# Patient Record
Sex: Male | Born: 1940 | Race: White | Hispanic: No | Smoking: Former smoker
Health system: Southern US, Community
[De-identification: ages and names within clinical notes are randomized; demographics above are authoritative.]

## PROBLEM LIST (undated history)

## (undated) DIAGNOSIS — F0390 Unspecified dementia without behavioral disturbance: Secondary | ICD-10-CM

## (undated) DIAGNOSIS — I340 Nonrheumatic mitral (valve) insufficiency: Secondary | ICD-10-CM

## (undated) DIAGNOSIS — I10 Essential (primary) hypertension: Secondary | ICD-10-CM

## (undated) DIAGNOSIS — I219 Acute myocardial infarction, unspecified: Secondary | ICD-10-CM

## (undated) DIAGNOSIS — E785 Hyperlipidemia, unspecified: Secondary | ICD-10-CM

## (undated) DIAGNOSIS — I82402 Acute embolism and thrombosis of unspecified deep veins of left lower extremity: Secondary | ICD-10-CM

## (undated) DIAGNOSIS — I2510 Atherosclerotic heart disease of native coronary artery without angina pectoris: Secondary | ICD-10-CM

## (undated) DIAGNOSIS — K219 Gastro-esophageal reflux disease without esophagitis: Secondary | ICD-10-CM

## (undated) DIAGNOSIS — I5042 Chronic combined systolic (congestive) and diastolic (congestive) heart failure: Secondary | ICD-10-CM

## (undated) HISTORY — DX: Hyperlipidemia, unspecified: E78.5

## (undated) HISTORY — PX: CERVICAL SPINE SURGERY: SHX589

## (undated) HISTORY — PX: ELBOW SURGERY: SHX618

## (undated) HISTORY — PX: BACK SURGERY: SHX140

## (undated) HISTORY — PX: LAMINECTOMY: SHX219

---

## 2005-07-05 DIAGNOSIS — I219 Acute myocardial infarction, unspecified: Secondary | ICD-10-CM

## 2005-07-05 HISTORY — PX: CORONARY ARTERY BYPASS GRAFT: SHX141

## 2005-07-05 HISTORY — DX: Acute myocardial infarction, unspecified: I21.9

## 2006-05-15 ENCOUNTER — Inpatient Hospital Stay (HOSPITAL_COMMUNITY): Admission: EM | Admit: 2006-05-15 | Discharge: 2006-05-20 | Payer: Self-pay | Admitting: Emergency Medicine

## 2006-06-03 ENCOUNTER — Encounter: Admission: RE | Admit: 2006-06-03 | Discharge: 2006-06-03 | Payer: Self-pay | Admitting: Cardiothoracic Surgery

## 2010-02-17 ENCOUNTER — Encounter: Admission: RE | Admit: 2010-02-17 | Discharge: 2010-02-17 | Payer: Self-pay | Admitting: Neurology

## 2010-03-18 ENCOUNTER — Inpatient Hospital Stay (HOSPITAL_COMMUNITY): Admission: RE | Admit: 2010-03-18 | Discharge: 2010-03-23 | Payer: Self-pay | Admitting: Neurosurgery

## 2010-09-17 LAB — GLUCOSE, CAPILLARY
Glucose-Capillary: 170 mg/dL — ABNORMAL HIGH (ref 70–99)
Glucose-Capillary: 175 mg/dL — ABNORMAL HIGH (ref 70–99)
Glucose-Capillary: 176 mg/dL — ABNORMAL HIGH (ref 70–99)
Glucose-Capillary: 187 mg/dL — ABNORMAL HIGH (ref 70–99)
Glucose-Capillary: 200 mg/dL — ABNORMAL HIGH (ref 70–99)
Glucose-Capillary: 201 mg/dL — ABNORMAL HIGH (ref 70–99)
Glucose-Capillary: 205 mg/dL — ABNORMAL HIGH (ref 70–99)
Glucose-Capillary: 219 mg/dL — ABNORMAL HIGH (ref 70–99)
Glucose-Capillary: 230 mg/dL — ABNORMAL HIGH (ref 70–99)
Glucose-Capillary: 168 mg/dL — ABNORMAL HIGH (ref 70–99)
Glucose-Capillary: 222 mg/dL — ABNORMAL HIGH (ref 70–99)

## 2010-09-17 LAB — BASIC METABOLIC PANEL
BUN: 13 mg/dL (ref 6–23)
CO2: 31 mEq/L (ref 19–32)
Calcium: 9.3 mg/dL (ref 8.4–10.5)
Creatinine, Ser: 0.78 mg/dL (ref 0.4–1.5)
GFR calc non Af Amer: >60 mL/min (ref >60)
Glucose, Bld: 140 mg/dL — ABNORMAL HIGH (ref 70–99)

## 2010-09-17 LAB — SURGICAL PCR SCREEN: MRSA, PCR: NEGATIVE

## 2010-09-17 LAB — CBC
MCH: 34.7 pg — ABNORMAL HIGH (ref 26.0–34.0)
MCHC: 36.5 g/dL — ABNORMAL HIGH (ref 30.0–36.0)
Platelets: 177 10*3/uL (ref 150–400)
RDW: 13.2 % (ref 11.5–15.5)

## 2010-09-17 LAB — PROTIME-INR
INR: 1.07 (ref 0.00–1.49)
Prothrombin Time: 14.1 seconds (ref 11.6–15.2)

## 2010-11-20 NOTE — Op Note (Signed)
NAMESHARRON, SIMPSON NO.:  [id number]   MEDICAL RECORD NO.:  [id number]          PATIENT TYPE:  INP   LOCATION:  2314                         FACILITY:  [facility]   PHYSICIAN:  Kerin Perna, M.D.  DATE OF BIRTH:  April 13, 1941   DATE OF PROCEDURE:  05/17/2006  DATE OF DISCHARGE:                                 OPERATIVE REPORT   OPERATION:  Emergency coronary artery bypass grafting x4 (left internal  mammary artery to LAD, saphenous vein graft to diagonal, saphenous vein  graft to obtuse marginal, saphenous vein graft to posterior descending).   PREOPERATIVE DIAGNOSIS:  Severe three-vessel coronary disease with acute  myocardial infarction and unstable angina.   POSTOPERATIVE DIAGNOSIS:  Severe three-vessel coronary disease with acute  myocardial infarction and unstable angina.   SURGEON:  Kerin Perna, M.D.   ASSISTANT:  Jerold Coombe, P.A.   ANESTHESIA:  General.   INDICATIONS:  The patient is a 70 year old male who has had severe  epigastric discomfort felt to be reflux who had undergone GI evaluation in  recent past.  However because of severe recurrent symptoms.  He presented  back to his outside hospital and was found to have an abnormal EKG with  positive cardiac enzymes.  He is transferred directly to [facility] cath  lab where he underwent emergency catheterization.  This was performed Dr.  Nanetta Batty.  Findings at cath included a severe ostial LAD stenosis of  95% with acute occlusion of the OM branch.  Also had severe disease of his  right coronary and LAD.  His LV systolic function was fairly well-preserved.  He is felt to be candidate for emergency surgical evaluation.   Prior to operation I examined the patient in the cardiac cath lab discussed  the case with the patient his cardiologist and his family.  I reviewed with  the patient the indications and expected benefits as well as risks of  emergency surgery.  The patient  demonstrated understanding and agreed to  proceed with emergency bypass surgery.   OPERATIVE FINDINGS:  The patient's saphenous vein was harvested from the  right leg however it was scarred below the knee.  The left leg was exposed  but was very small and scarred and not usable.  For that reason, one segment  of cryopreserved vein was used to perform the graft to the diagonal.  The  other bypass grafts were using native saphenous vein.  The patient received  platelets and FFP following termination of bypass for diffuse coagulopathy  which improved after the blood products were administered.  The patient had  evidence of a subacute lateral MI with myocardial edema and abnormal muscle  appearance.   PROCEDURE:  The patient was brought directly from the catheterization lab to  the operating room where general anesthesia was induced.  The chest, abdomen  and legs were prepped Betadine and draped as a sterile field.  A sternal  incision was made as the saphenous vein was harvested endoscopically from  the right leg.  The left internal mammary artery was harvested as  a pedicle  graft from its origin at subclavian vessels.  It was a good vessel with  excellent flow.  Heparin was administered and ACT was documented as being  therapeutic.  A sternal retractor was placed and the pericardium was opened  and suspended.  Pursestrings were placed in the ascending aorta and right  atrium and the patient was cannulated and placed on bypass.  The coronaries  were identified for grafting.  They were severely diseased.  The right  coronary notably had significant distal disease where it was not suitable  for graft.  It was felt to place the bypass graft in the main portion of the  posterior descending.  Saphenous vein and mammary artery were prepared for  the distal anastomoses and cardioplegia cannulas were placed in the  ascending aorta and right atrium into the right coronary sinus.  The patient  was  cooled to 30 degrees and aortic crossclamp was applied.  1 liter of cold  blood cardioplegia was delivered and there is good cardioplegic arrest.   The distal coronary anastomoses were performed.  First distal anastomosis  was to the posterior descending branch of the right.  It had severe proximal  95% calcified stenosis.  There is distal disease as well.  Reverse saphenous  vein was sewn end-to-side with running 7-0 Prolene.  There is good flow  through graft.  The second distal anastomosis was to the obtuse marginal  branch of the circumflex.  This had been totally occluded.  This was a 1.5-  mm vessel.  Reverse saphenous vein was sewn end-to-side with running 7-0  Prolene with good flow through graft.  Cardioplegia was redosed.  The third  distal anastomosis was to the diagonal branch of LAD.  The LAD had a  proximal 95% stenosis which was heavily calcified extending to the origin of  the diagonal.  The segment of cryopreserved vein was sewn end-to-side with  running 7-0 Prolene.  There is good flow through graft.  Cardioplegia was  redosed.  The fourth distal anastomosis was placed to the distal third of  LAD.  This a 1.5-mm vessel and had a proximal 95% stenosis.  The left IMA  pedicle was brought through an opening created in the left lateral  pericardium was brought down onto the LAD and sewn end-to-side with running  8-0 Prolene.  There is excellent flow through the anastomosis after briefly  releasing the pedicle bulldog on the mammary pedicle.  The bulldog was  replaced and the pedicle secured to epicardium.   Cardioplegia was redosed.  While the crossclamp was still in place three  proximal vein anastomoses were performed on the ascending aorta using a 4.0-  mm punch running 6-0 Prolene.  Air was removed from the left side of heart  with a dose of retrograde warm blood cardioplegia and the crossclamp was  removed.  The heart resumed a spontaneous rhythm.  The patient was  rewarmed to 37  degrees.  Air was aspirated from the vein grafts with a 27 gauge needle.  Each graft was inspected and found to have good flow with hemostasis at the  proximal and distal anastomoses.  Temporary pacing wires were applied.  Lungs re-expanded.  The ventilator was resumed.  When the patient was  rewarmed, he was weaned from bypass on low-dose dopamine.  Blood pressure  and cardiac output were acceptable. Protamine was administered without  adverse reaction.  The cannulas removed.  There still diffuse coagulopathy.  The patient's platelet  count was low and the patient was given platelets and  FFP.  This improved coagulation function.  The mediastinum was irrigated  warm antibiotic irrigation and leg incisions were irrigated and closed in a  standard fashion.  The superior pericardial fat was closed over the aorta.  Two mediastinal and a left pleural chest tube were placed and brought out  through separate incisions.  The sternum was closed with interrupted steel  wire.  The pectoralis fascia was closed in running #1 Vicryl.  Subcutaneous  and skin layers were closed in running Vicryl and sterile dressings were  applied.  Total bypass time was 130 minutes and crossclamp time of 90  minutes.      Kerin Perna, M.D.  Electronically Signed     PV/MEDQ  D:  05/17/2006  T:  05/18/2006  Job:  [id number]   cc:   Nanetta Batty, M.D.

## 2010-11-20 NOTE — Consult Note (Signed)
Roy Hickman, Roy Hickman NO.:  [id number]   MEDICAL RECORD NO.:  [id number]          PATIENT TYPE:  INP   LOCATION:  1828                         FACILITY:  [facility]   PHYSICIAN:  Kerin Perna, M.D.  DATE OF BIRTH:  [birth year]   DATE OF CONSULTATION:  05/15/2006  DATE OF DISCHARGE:                                   CONSULTATION   REFERRING PHYSICIAN:  Nanetta Batty, M.D.   REASON FOR CONSULTATION:  Severe three vessel coronary artery disease with  occluded circumflex marginal and acute evolving myocardial infarction.   CHIEF COMPLAINT:  Chest pain.   HISTORY OF PRESENT ILLNESS:  I was asked to evaluate this 70 year old  exsmoker for potential surgical coronary revascularization (emergency) for  recently diagnosed severe three vessel coronary artery disease with left  main stenosis and occlusion of a circumflex marginal.  The patient has had  exertional heartburn for the past two weeks.  He had been treated with  antacids.  He underwent endoscopy and was treated with further antacids.  He  presented to the emergency department at the [facility] with pain and  slight EKG changes and was found to have positive cardiac enzymes.  He was  sent to [facility] for emergency cardiac evaluation.  His CPK-MB  was 15 ng/mL.  His EKG showed a sinus rhythm with slight nonspecific ST  changes.  He underwent emergency cardiac catheterization recently by Dr.  Allyson Sabal which demonstrated an occluded large OM1, 70% stenosis of the LAD, 95%  stenosis of the circumflex ostium and an 80% stenosis of the right coronary.  EF was 50% and left ventricular end-diastolic pressure was 14 mmHg.  Because  he was not felt to be a candidate for angioplasty or stent, emergency CABG  was recommended.   PAST MEDICAL HISTORY:  1. Hypertension.  2. Hyperlipidemia.  3. Low back pain status post laminectomy in 1995.  4. No known drug allergies.   MEDICATIONS:  1. Caduet 10/10.  2.  [name] 500.  3. Percocet p.r.n.  4. He has also been on Nexium.   SOCIAL HISTORY:  Single.  Stopped smoking in 1988.  Retired Product manager.  Does not use alcohol.  No illicit drug use.   FAMILY HISTORY:  Positive for coronary disease, one parent had a myocardial  infarction.  No family history of diabetes.   REVIEW OF SYSTEMS:  CONSTITUTIONAL REVIEW:  Negative for fever or weight  loss.  His current weight is approximately 230 which is normal.  HEENT  REVIEW:  Positive for total dentures.  Negative for difficulty swallowing.  Thoracic review is negative for chest trauma or history of abnormal chest x-  ray.  CARDIAC REVIEW:  Positive for coronary artery disease with EF of 50%,  acute non-Q-wave MI.  No evidence of valvular disease.  No history of  arrhythmia.  GI REVIEW:  Positive for his recent evaluation for reflux.  Negative for hepatitis, jaundice, gallstones, blood per rectum.  UROLOGIC  REVIEW:  Negative for BPH or kidney stones.  VASCULAR REVIEW:  Negative for  DVT,  claudication or TIA.  ENDOCRINE REVIEW:  Negative for diabetes or  thyroid disease, although he was told that he had a diabetic tendency after  his evaluation for endoscopy.  NEUROLOGICAL:  Negative for stroke or  seizure.   PHYSICAL EXAMINATION:  The patient is a 5 foot 11 and weighs 230 pounds.  Blood pressure 140/80, pulse 80, respirations 18.  He is afebrile.  General  appearance is that of a pleasant, middle-aged male in the cath lab in no  acute distress but he is anxious.  He recently had his left heart cath.  ENT  review is normocephalic.  Full EOMs.  Edentulous.  Neck is without JVD, mass  or carotid bruit.  Lymphatics reveal no palpable adenopathy.  Breath sounds  are clear and equal.  There is no thoracic deformity.  Cardiac exam reveals  regular rhythm without murmur or gallop.  Abdominal exam is soft and  nontender without mass or organomegaly.  Extremities reveal no clubbing,  cyanosis, or  edema.  Peripheral pulses are present in the extremities.  There is a sheath in the right femoral artery which is sutured and intact  without bleeding.  His skin is without rash or lesion.  Neurologic exam is  alert and oriented without focal motor deficit.   LABORATORY DATA:  CPK-MB 50, creatinine 0.7.  Hematocrit 45.  Troponin 2.7.  Blood sugar 160.  Chest x-ray pending.  I reviewed the coronary arteriograms  with Dr. Nanetta Batty in the cath lab and agree with his interpretation of  severe three vessel disease with an ostial 95% stenosis of the circumflex  and occluded OM1.  He is not a candidate for acute intervention of the  occluded vessel due to his ostial circumflex, 95% stenosis.   IMPRESSION/PLAN:  The patient was recommended for emergency coronary artery  bypass surgery.  I discussed the procedure with the patient and then  separately with his family.  The patient understands the indications and  risks and expected recovery as well as the alternatives to surgery and he  agrees to proceed with surgery as an emergency.      Kerin Perna, M.D.  Electronically Signed     PV/MEDQ  D:  05/15/2006  T:  05/15/2006  Job:  [id number]   cc:   CVTS Office  [facility]

## 2010-11-20 NOTE — Discharge Summary (Signed)
Roy Hickman, Roy Hickman NO.:  [id number]   MEDICAL RECORD NO.:  [id number]          PATIENT TYPE:  INP   LOCATION:  2005                         FACILITY:  [facility]   PHYSICIAN:  Kerin Perna, M.D.  DATE OF BIRTH:  September 21, 1940   DATE OF ADMISSION:  05/15/2006  DATE OF DISCHARGE:                                 DISCHARGE SUMMARY   CARDIOLOGIST:  Dr. Nanetta Batty.   ADMISSION DATE:  May 15, 2006   DISCHARGE DATE:  Tentatively in one to two days.   ADMISSION DIAGNOSIS:  Chest pain.   DISCHARGE DIAGNOSES:  1. Severe three-vessel coronary artery disease with occluded circumflex      marginal, status post emergent coronary artery bypass grafting.  2. Acute evolving myocardial infarction, May 15, 2006.  3. Hypertension.  4. Hyperlipidemia.  5. A history of low back pain, status post laminectomy in 1995.  6. No known drug allergies.  7. Former tobacco abuser, stopped in 1988.  8. Postoperative volume overload.  9. Diabetes mellitus, new onset.   PROCEDURES:  1. May 15, 2006, the patient had a right cardiac catheterization by      Dr. Nanetta Batty.  2. May 15, 2006, the patient underwent emergent coronary artery      bypass grafting x4 (left internal mammary artery to the LAD, saphenous      vein graft to the diagonal, saphenous vein graft to obtuse marginal,      saphenous vein graft to the posterior descending) by Dr. Kathlee Nations      Trigt.   HISTORY AND PHYSICAL:  This is a 70 year old male complaining of severe  epigastric discomfort, felt to be reflux, who had undergone GI evaluation in  the recent past.  However, because there was severe recurrent symptoms he  presented back to [facility].   See dictated History and Physical for further details.   HOSPITAL COURSE:  On arrival to the hospital, the patient had an abnormal  EKG with positive cardiac enzymes.  He was transferred directly to the [facility]  Cath Lab where he  underwent emergency catheterization.  This was performed  by Dr. Nanetta Batty who found severe ostial LAD stenosis of 95% with acute  occlusion of the OM branch.  Also, he had severe disease of his right  coronary artery and LAD.  His LV systolic function was fairly well  preserved.  It was best thought that the patient undergo coronary artery  bypass grafting.  The benefits were explained to the patient and he agreed  to proceed.  The patient underwent emergent CABG May 16, 2006 at 5 a.m.  without any complications.   Postop day #1, the patient was extubated.  He was 95% on oxygen.  The  patient did have postop atelectasis with a small pleural effusion; however,  his sats were 96% on room air.  The patient did have some acute blood loss  anemia; however, he did not require any blood transfusion and his hemoglobin  and hematocrit have remained stable and will be monitored closely.  The  patient  did have some postop hypokalemia and this was replaced appropriately  with potassium chloride.   The patient did have an increase in CBGs postoperatively and he has been  maintained on a sliding scale of insulin and Lantus subcutaneous nightly.  On May 19, 2006, patient was started on metformin 500 b.i.d. and his  Lantus was discontinued.  We will follow up his CBGs in the morning.  Currently they are 144/156/163.   Postoperatively, the patient is maintaining normal sinus rhythm and he is on  amiodarone prophylactically.  The patient did have postop volume overload  and is being diuresed with Lasix.   The patient was transferred to 2000 on postop day #2.  He is neurologically  intact.  He is walking well in Cardiac Rehab with a steady gait.  His oxygen  sats are remaining __________ .  The patient is on iron and folic acid for  his anemia.   Physical exam postop day #3 shows vital signs stable and afebrile.  CBG is  on 144/__________.  Weight 226 (235).  White blood cell count  9.2,  hemoglobin 9.1, hematocrit 22, platelet count 132.  BMP shows a sodium of  141, potassium 3.5, __________ 107,  chloride 131.  BUN 18, creatinine 0.9,  and a glucose count of 111.  Cardiac reveals regular rate and rhythm.  Lungs  CTA bases bilaterally.  Abdomen is benign.  Incision is clear, dry, intact.  Extremities: positive edema.   DISCHARGE DISPOSITION:  The patient will be discharged home in the next two  to three days provided he remains hemodynamically stable and blood pressure  is stable and he continues to progress in a positive manner.   MEDICATIONS:  1. Toprol XL 50 mg p.o. daily.  2. Lipitor 40 mg p.o. daily.  3. __________  50 mg p.o. daily.  4. Folic acid 1 mg p.o. daily.  5. Metformin 500 mg p.o. b.i.d.  6. Plavix 75 mg p.o. daily.  7. Nexium 40 mg p.o. daily.  8. Amiodarone __400________  mg b.i.d. x14 days, then 200 mg b.i.d.  9. __________ days.  10.Potassium chloride 20 mEq p.o. daily x5 days.  11.Oxycodone 5 mg one to two tabs every 4 hours p.r.n.   DISCHARGE INSTRUCTIONS:  The patient instructed to follow a low-fat/-low-  salt diabetic diet.  He was instructed on diabetes education while in the  hospital.  The patient is on no driving, no heaving lifting greater than ten  pounds.  The patient is to ambulate three to four times daily and increase  activity as tolerated.  He is to continue his breathing exercises.  He may  shower and clean his incisions with mild soap and water.  Call the office if  any problem should arise from his incision such as erythema, drainage, temp  greater than 101.5.   FOLLOWUP:  The patient will have a follow-up appointment with Dr. Donata Clay  on June 03, 2006 at 2 p.m.  He is to call Dr. Allyson Sabal for an appointment  in two weeks.  The patient will have a chest x-ray taken at [facility] prior to seeing Dr. Donata Clay.      Constance Holster, [place]      Kerin Perna, M.D. Electronically Signed     JMW/MEDQ  D:  05/19/2006  T:  05/20/2006  Job:  [id number]   cc:   Nanetta Batty, M.D.

## 2010-11-20 NOTE — Cardiovascular Report (Signed)
[name], IMBERT NO.:  [id number]   MEDICAL RECORD NO.:  [id number]          PATIENT TYPE:  INP   LOCATION:  1828                         FACILITY:  [facility]   PHYSICIAN:  Nanetta Batty, M.D.   DATE OF BIRTH:  March 18, 1941   DATE OF PROCEDURE:  05/15/2006  DATE OF DISCHARGE:                              CARDIAC CATHETERIZATION   MEDICATIONS:  Mr. Roy Hickman is a 70 year old single white male with no children  who has positive risk factors include hypertension and hyperlipidemia.  He  developed epigastric-type chest burning approximately a week ago and was  admitted to [facility] where he underwent endoscopy without  significant pathology.  He has had off and on chest pain throughout the  entire week and this morning his pain was unrelenting.  He presented to the  emergency room early this evening at [facility] where he was seen  by the ER doctor.  He was found to have subtle anterior and lateral ST-  segment elevation.  He remained hemodynamically stable.  He was treated with  IV heparin and baby aspirin.  His initial enzymes were positive.  He is  brought urgently to the cath lab as a code STEMI for urgent angiography  and potential intervention.   DESCRIPTION OF PROCEDURE:  The patient was brought to the 2nd floor [facility] Lab in the postabsorptive state.  The patient did have a  meal approximately 3 hours prior to the catheterization.   His right groin was prepped and draped in the usual sterile fashion.  Xylocaine 1% was used for local anesthesia.  A 6-French sheath was inserted  in to the right femoral artery using standard Seldinger technique.  A 6-  French right and left Judkins diagnostic catheter with a 6-French pigtail  catheter were used for selective cholangiography, left ventriculography,  subselective left internal mammary artery angiography and distal abdominal  aortography.  ICD dye was used for the entirety of the  case.  Retrograde  aortic, ventricular blood pressures were recorded.   HEMODYNAMIC RESULTS:  1. Aortic systolic pressure 127, diastolic pressure 57.  2. Left ventricular systolic pressure 126 and diastolic pressure 14.   SELECTIVE CORONARY ANGIOGRAPHY:  1. Fluoroscopy:  There was fluoroscopic calcification in the left main      proximal circumflex, LAD and the right coronary artery.  2. Left main:  Left main appeared smoothly tapered down to the      trifurcation in the 60-70% range.  3. LAD:  The LAD was a diffusely diseased vessel.  There was of 50-60%      segmental proximal and mid-stenosis.  There was a moderate-sized first      diagonal branch.  There was bifurcating with moderate disease in its      proximal portion.  4. Ramus branch:  Small and free of significant disease.  5. Left circumflex:  This was the infarct-related artery.  There was a 95%      ostial stenosis that appeared hazy and possibly thrombotic.  The      vessel was  then occluded in its midportion just after the takeoff of      the atrial branch.  6. The right coronary:  A large dominant vessel with 70-80% segmental mid-      stenosis.  The PDA was segmentally disease as well as 70-80% in the      proximal midportion.  The 2-3 PLA branches had only mild disease.   LEFT VENTRICULOGRAPHY:  RAO and LAO left ventriculograms were performed  using 20 mL of Visipaque dye at 10 mL per second in each view.  There was a  moderate lateral/posterolateral hypokinesia.  The estimated EF was in the  50% range.   Left internal mammary artery:  This vessel was subselectively visualized and  was widely patent.  It was suitable for use during coronary artery bypass  grafting.  Of note was that the left vertebral had a 70-80% stenosis  approximately 2 cm beyond this origin.   DISTAL ABDOMINAL AORTOGRAPHY:  Distal abdominal aortogram was performed  using 25 mL of Visipaque dye at a 20 mL per second.  The renal arteries   appeared widely patent.  Th infrarenal abdominal aorta and iliac bifurcation  with atherosclerotic changes.   IMPRESSION:  Mr. Roy Hickman has had a lateral wall infarct as a result of  occlusion in the mid-circumflex with high-grade ostial disease involving the  distal left main.  He has left main three-vessel disease with preserved left  ventricular function.  The lateral wall is hypokinetic but still mobile and  probably viable.  I believe percutaneous intervention puts him at adverse  risk given the proximity of the lesion to the distal left main which itself  appears significantly diseased and thus I feel the best option for him would  be urgent coronary artery bypass grafting for complete revascularization.  Dr. Kathlee Nations Trigt of the cardiovascular and thoracic surgeons has been  consulted and he will render a final disposition.  The ACT was measured at  greater than 200.  The sheath was sewn securing in place.  The diagnostic  catheters were removed.  The patient was nauseated at the end of the case  and received IV Zofran but has remained hemodynamically stable with a sinus  rhythm and without arrhythmias throughout the entirety of the case.      Nanetta Batty, M.D.  Electronically Signed     JB/MEDQ  D:  05/15/2006  T:  05/15/2006  Job:  [id number]   cc:   Patient's chart  2nd floor [facility]  [facility]  [name], [place]

## 2011-02-27 ENCOUNTER — Encounter: Payer: Self-pay | Admitting: Emergency Medicine

## 2011-02-27 ENCOUNTER — Emergency Department (HOSPITAL_COMMUNITY)
Admission: EM | Admit: 2011-02-27 | Discharge: 2011-02-27 | Disposition: A | Discharge status: Home / Self Care | Payer: Medicare Other | Attending: Emergency Medicine | Admitting: Emergency Medicine

## 2011-02-27 DIAGNOSIS — Z87891 Personal history of nicotine dependence: Secondary | ICD-10-CM | POA: Insufficient documentation

## 2011-02-27 DIAGNOSIS — I10 Essential (primary) hypertension: Secondary | ICD-10-CM | POA: Insufficient documentation

## 2011-02-27 DIAGNOSIS — L259 Unspecified contact dermatitis, unspecified cause: Secondary | ICD-10-CM | POA: Insufficient documentation

## 2011-02-27 DIAGNOSIS — L239 Allergic contact dermatitis, unspecified cause: Secondary | ICD-10-CM

## 2011-02-27 DIAGNOSIS — I252 Old myocardial infarction: Secondary | ICD-10-CM | POA: Insufficient documentation

## 2011-02-27 DIAGNOSIS — E119 Type 2 diabetes mellitus without complications: Secondary | ICD-10-CM | POA: Insufficient documentation

## 2011-02-27 HISTORY — DX: Nonrheumatic mitral (valve) insufficiency: I34.0

## 2011-02-27 HISTORY — DX: Essential (primary) hypertension: I10

## 2011-02-27 MED ORDER — DIPHENHYDRAMINE HCL 25 MG PO CAPS
50.0000 mg | ORAL_CAPSULE | Freq: Once | ORAL | Status: AC
  Administered 2011-02-27: 50 mg via ORAL
  Filled 2011-02-27: qty 2

## 2011-02-27 MED ORDER — METHYLPREDNISOLONE SODIUM SUCC 125 MG IJ SOLR
125.0000 mg | Freq: Once | INTRAMUSCULAR | Status: AC
  Administered 2011-02-27: 125 mg via INTRAMUSCULAR
  Filled 2011-02-27: qty 2

## 2011-02-27 MED ORDER — PREDNISONE 10 MG PO TABS: 20.0000 mg | ORAL_TABLET | Freq: Every day | ORAL | Status: AC

## 2011-02-27 NOTE — ED Provider Notes (Signed)
History     CSN: [id number] Arrival date & time: 02/27/2011  7:52 PM  Chief Complaint  Patient presents with  . Rash   HPI Comments: Patient reports one month history of worsening rash primarily over both of his lower extremities but also in spots on his back his upper extremities and torso. It spares his face and neck. Patient reports he has spoken to his primary care doctor in [place] who told him that if his symptoms were not going to clear up that she would send him to a dermatologist; however in the last few days has gotten much worse therefore he was driven to come here to the emergency department. He denies any shortness of breath, wheezing, feelings of lightheadedness or passing out, vomiting or diarrhea. He denies change in his usual pattern as far as medications, diet, lotions, perfumes soaps or detergents. Since then he has changed to more mild soaps and is using lotion to his legs at nighttime without any significant relief. He is not currently married but has friends that stay for extended periods of time in his apartment and there is no report of anyone else having similar symptoms.  The patient denies any fever or chills. He denies any significant pain to any areas of rash. Not seen any purulent discharge. Occasionally where his skin is excoriated from scratching he is having small areas of bleeding and bruising.  Patient is a 70 y.o. male presenting with rash. The history is provided by the patient.  Rash     Past Medical History  Diagnosis Date  . Hypertension   . Diabetes mellitus   . MI (mitral incompetence)     Past Surgical History  Procedure Date  . Back surgery   . Elbow surgery     Family History  Problem Relation Age of Onset  . Heart failure Father   . Heart failure Sister     History  Substance Use Topics  . Smoking status: Former Smoker -- 25 years    Types: Cigarettes    Quit date: 02/27/1987  . Smokeless tobacco: Never Used  . Alcohol Use: No        Review of Systems  Constitutional: Negative.   HENT: Negative for facial swelling and rhinorrhea.   Eyes: Negative for discharge, redness and itching.  Respiratory: Negative.  Negative for shortness of breath and wheezing.   Musculoskeletal: Negative for myalgias, back pain and joint swelling.  Skin: Positive for rash.  Neurological: Negative for weakness and light-headedness.    Physical Exam  BP 148/110  Pulse 100  Resp 14  Ht 6' (1.829 m)  Wt 198 lb (89.812 kg)  BMI 26.85 kg/m2  SpO2 100%  Physical Exam  Vitals reviewed. Constitutional: He appears well-developed and well-nourished.  HENT:  Head: Normocephalic and atraumatic.  Eyes: Conjunctivae and EOM are normal. Pupils are equal, round, and reactive to light.  Cardiovascular: Normal rate.   Pulmonary/Chest: Effort normal. No respiratory distress. He has no wheezes.  Musculoskeletal: Normal range of motion.  Neurological: He is alert. He has normal strength. No sensory deficit.  Skin: Skin is warm. Rash noted. No ecchymosis noted. Rash is papular. Rash is not nodular. There is erythema. No cyanosis.       ED Course  Procedures  MDM Patient with symptoms of allergic reaction but not anaphylaxis. Symptoms have been chronic for approximately 4 weeks. The patient with benefit from steroids to suppress his immune function for longer duration relief until he can be  seen by dermatologist. He is a diabetic so is cautioned to monitor his blood sugars closely while he is on steroids. Otherwise we'll also give him a dose of Benadryl here for immediate itching. There is no evidence of any airway compromise and can be treated as an outpatient.     Gavin Pound. Oletta Lamas, MD 02/27/11 2054

## 2011-02-27 NOTE — Discharge Instructions (Signed)
 Allergic Contact Dermatitis Allergic contact dermatitis is an itchy, red and scaly rash. It is caused by an allergic reaction to certain substances that touch your skin. The rash often times appears within one to three days after contact.   CAUSES  Poison oak.   Poison ivy.   Chemicals (deodorants, shampoos).   Jewelry.   Latex.   Neomycin in triple antibiotic cream.  SYMPTOMS  In poison ivy or oak, a very itchy rash with tiny blisters may appear. This comes 1-2 days after exposure.   The skin may also appear to be dry with flaking.   There can be marked swelling in some areas such as the eyelids, mouth or genitals.  TREATMENT It is most important to avoid all contact with the allergic substances. Additional treatment of allergic dermatitis may include:  Burrow's solution soaks to help dry the oozing sores.   Cortisone creams or ointments applied 3-4 times daily. For best results, soak rash area in cool water for 20 minutes. Then apply the medicine. Cover the area with a plastic wrap. You can store the cortisone cream in the refrigerator for a chilly effect on your rash. That may decrease itching.   Injections or oral cortisone medicines are needed in more severe cases.   Antihistamine medicine helps ease the itching.   A cool bath can also help stop itching. Try not to scratch the rash.   Medicines that kill germs (antibiotics) may be needed if there is a skin infection present.  DIAGNOSIS In cases where the cause is uncertain, patch skin tests may be performed to help confirm the identity of the source. HOME CARE INSTRUCTIONS  Use medications as directed.   Keep the affected area away from anything that would irritate your skin.   If you have poison oak or ivy, the clothing you were wearing at the time of contact should be washed or cleaned. This will get rid of the substance (resin) that caused the allergic reaction.   If you have a pet, the resins may also be  present in their fur. They also need washing with soap and water.   You may also prevent poison oak or ivy by washing exposed skin with soap and water as soon as possible after contact.   As with poison ivy, avoid contact with these plants. Wear protective clothing and gloves when you must be in contact. A barrier cream such as Ivy Block or Shield before exposure will help an outbreak if you bathe within 8 hours of contact.  SEEK MEDICAL CARE IF:  Your condition is not better after 3 days of treatment or you seem to keep getting worse.   You see signs of infection, such as swelling, tenderness, redness or soreness (inflammation), or warmth of affected area.   You have any problems related to medication used.  Document Released: 07/29/2004 Document Re-Released: 09/17/2008 [facility] Patient Information 2011 [place], [place].   It is unclear what is causing your reaction.  The steroids will take effect in approximately 8 hours, but continue to take some oral steroids for the next few days.  You may also want to try some topical hydrocortisone lotion for your legs.  Call the dermatology office on Monday for an appointment, or contact your doctor for a referral.  I recommend benadryl  25 mg orally every 6 hours for more immediate relief from itching.  Try to keep your skin moist after bathing.  Watch your blood sugars carefully while taking steroids as it can make  your blood sugar go up quickly.

## 2011-02-27 NOTE — ED Notes (Signed)
Patient c/o rash that started on legs bilaterally and has progressively spread. Patient reports itching. Denies any drainage or fevers.

## 2011-05-10 ENCOUNTER — Encounter (HOSPITAL_COMMUNITY): Payer: Self-pay | Admitting: *Deleted

## 2011-05-10 ENCOUNTER — Inpatient Hospital Stay (HOSPITAL_COMMUNITY)
Admission: EM | Admit: 2011-05-10 | Discharge: 2011-05-14 | DRG: 556 | Disposition: A | Discharge status: Skilled Nursing Facility | Payer: Medicare Other | Attending: Internal Medicine | Admitting: Internal Medicine

## 2011-05-10 DIAGNOSIS — I2510 Atherosclerotic heart disease of native coronary artery without angina pectoris: Secondary | ICD-10-CM | POA: Diagnosis present

## 2011-05-10 DIAGNOSIS — E785 Hyperlipidemia, unspecified: Secondary | ICD-10-CM | POA: Diagnosis present

## 2011-05-10 DIAGNOSIS — G8929 Other chronic pain: Secondary | ICD-10-CM | POA: Diagnosis present

## 2011-05-10 DIAGNOSIS — M549 Dorsalgia, unspecified: Secondary | ICD-10-CM

## 2011-05-10 DIAGNOSIS — Z951 Presence of aortocoronary bypass graft: Secondary | ICD-10-CM

## 2011-05-10 DIAGNOSIS — Z9181 History of falling: Secondary | ICD-10-CM

## 2011-05-10 DIAGNOSIS — I10 Essential (primary) hypertension: Secondary | ICD-10-CM | POA: Diagnosis present

## 2011-05-10 DIAGNOSIS — R262 Difficulty in walking, not elsewhere classified: Secondary | ICD-10-CM

## 2011-05-10 DIAGNOSIS — M545 Low back pain, unspecified: Secondary | ICD-10-CM | POA: Diagnosis present

## 2011-05-10 DIAGNOSIS — E119 Type 2 diabetes mellitus without complications: Secondary | ICD-10-CM

## 2011-05-10 DIAGNOSIS — Z87891 Personal history of nicotine dependence: Secondary | ICD-10-CM

## 2011-05-10 DIAGNOSIS — Z79899 Other long term (current) drug therapy: Secondary | ICD-10-CM

## 2011-05-10 DIAGNOSIS — R29898 Other symptoms and signs involving the musculoskeletal system: Principal | ICD-10-CM | POA: Diagnosis present

## 2011-05-10 HISTORY — DX: Atherosclerotic heart disease of native coronary artery without angina pectoris: I25.10

## 2011-05-10 HISTORY — DX: Acute myocardial infarction, unspecified: I21.9

## 2011-05-10 NOTE — ED Notes (Signed)
He fell today when his legs gave way.  He has rt hip pain and multiple abrasions scattered over his upper torso

## 2011-05-11 ENCOUNTER — Emergency Department (HOSPITAL_COMMUNITY): Payer: Medicare Other

## 2011-05-11 ENCOUNTER — Encounter (HOSPITAL_COMMUNITY): Payer: Self-pay | Admitting: Internal Medicine

## 2011-05-11 ENCOUNTER — Inpatient Hospital Stay (HOSPITAL_COMMUNITY): Payer: Medicare Other

## 2011-05-11 DIAGNOSIS — E119 Type 2 diabetes mellitus without complications: Secondary | ICD-10-CM | POA: Diagnosis present

## 2011-05-11 DIAGNOSIS — M545 Low back pain, unspecified: Secondary | ICD-10-CM | POA: Diagnosis present

## 2011-05-11 DIAGNOSIS — I10 Essential (primary) hypertension: Secondary | ICD-10-CM | POA: Diagnosis present

## 2011-05-11 DIAGNOSIS — I2510 Atherosclerotic heart disease of native coronary artery without angina pectoris: Secondary | ICD-10-CM | POA: Diagnosis present

## 2011-05-11 DIAGNOSIS — R262 Difficulty in walking, not elsewhere classified: Secondary | ICD-10-CM | POA: Diagnosis present

## 2011-05-11 LAB — COMPREHENSIVE METABOLIC PANEL
AST: 13 U/L (ref 0–37)
Albumin: 3.3 g/dL — ABNORMAL LOW (ref 3.5–5.2)
Alkaline Phosphatase: 74 U/L (ref 39–117)
Chloride: 105 mEq/L (ref 96–112)
Potassium: 3.3 mEq/L — ABNORMAL LOW (ref 3.5–5.1)
Sodium: 141 mEq/L (ref 135–145)
Total Bilirubin: 0.5 mg/dL (ref 0.3–1.2)

## 2011-05-11 LAB — CBC
HCT: 36.6 % — ABNORMAL LOW (ref 39.0–52.0)
Hemoglobin: 13.2 g/dL (ref 13.0–17.0)
MCH: 33.4 pg (ref 26.0–34.0)
MCHC: 36.1 g/dL — ABNORMAL HIGH (ref 30.0–36.0)
MCV: 94.8 fL (ref 78.0–100.0)
MCV: 94.7 fL (ref 78.0–100.0)
Platelets: 223 10*3/uL (ref 150–400)
RBC: 3.8 MIL/uL — ABNORMAL LOW (ref 4.22–5.81)
RDW: 12.2 % (ref 11.5–15.5)

## 2011-05-11 LAB — URINALYSIS, ROUTINE W REFLEX MICROSCOPIC
Glucose, UA: >1000 mg/dL — AB
Ketones, ur: NEGATIVE mg/dL
Leukocytes, UA: NEGATIVE
Nitrite: NEGATIVE
Protein, ur: NEGATIVE mg/dL

## 2011-05-11 LAB — DIFFERENTIAL
Basophils Absolute: 0 10*3/uL (ref 0.0–0.1)
Eosinophils Absolute: 0.3 10*3/uL (ref 0.0–0.7)
Neutro Abs: 4 10*3/uL (ref 1.7–7.7)

## 2011-05-11 LAB — POCT I-STAT, CHEM 8
BUN: 15 mg/dL (ref 6–23)
Calcium, Ion: 1.18 mmol/L (ref 1.12–1.32)
HCT: 38 % — ABNORMAL LOW (ref 39.0–52.0)
Hemoglobin: 12.9 g/dL — ABNORMAL LOW (ref 13.0–17.0)
Sodium: 140 mEq/L (ref 135–145)
TCO2: 26 mmol/L (ref 0–100)

## 2011-05-11 LAB — GLUCOSE, CAPILLARY
Glucose-Capillary: 229 mg/dL — ABNORMAL HIGH (ref 70–99)
Glucose-Capillary: 244 mg/dL — ABNORMAL HIGH (ref 70–99)

## 2011-05-11 LAB — URINE MICROSCOPIC-ADD ON

## 2011-05-11 MED ORDER — HYDRALAZINE HCL 20 MG/ML IJ SOLN
10.0000 mg | INTRAMUSCULAR | Status: DC | PRN
  Filled 2011-05-11: qty 0.5

## 2011-05-11 MED ORDER — ACETAMINOPHEN 650 MG RE SUPP: 650.0000 mg | Freq: Four times a day (QID) | RECTAL | Status: DC | PRN

## 2011-05-11 MED ORDER — NIACIN 100 MG PO TABS
100.0000 mg | ORAL_TABLET | Freq: Two times a day (BID) | ORAL | Status: DC
  Administered 2011-05-11 – 2011-05-14 (×6): 100 mg via ORAL
  Filled 2011-05-11 (×8): qty 1

## 2011-05-11 MED ORDER — INSULIN ASPART 100 UNIT/ML ~~LOC~~ SOLN
0.0000 [IU] | Freq: Three times a day (TID) | SUBCUTANEOUS | Status: DC
  Administered 2011-05-11: 2 [IU] via SUBCUTANEOUS
  Administered 2011-05-11 – 2011-05-12 (×3): 3 [IU] via SUBCUTANEOUS
  Administered 2011-05-12 – 2011-05-13 (×2): 2 [IU] via SUBCUTANEOUS
  Administered 2011-05-13: 3 [IU] via SUBCUTANEOUS
  Administered 2011-05-14: 2 [IU] via SUBCUTANEOUS
  Filled 2011-05-11: qty 3

## 2011-05-11 MED ORDER — ACETAMINOPHEN 325 MG PO TABS: 650.0000 mg | ORAL_TABLET | Freq: Four times a day (QID) | ORAL | Status: DC | PRN

## 2011-05-11 MED ORDER — ONDANSETRON HCL 4 MG PO TABS: 4.0000 mg | ORAL_TABLET | Freq: Four times a day (QID) | ORAL | Status: DC | PRN

## 2011-05-11 MED ORDER — HYDRALAZINE HCL 25 MG PO TABS
25.0000 mg | ORAL_TABLET | Freq: Four times a day (QID) | ORAL | Status: DC
  Administered 2011-05-11 – 2011-05-12 (×5): 25 mg via ORAL
  Filled 2011-05-11 (×8): qty 1

## 2011-05-11 MED ORDER — ONDANSETRON HCL 4 MG/2ML IJ SOLN: 4.0000 mg | Freq: Four times a day (QID) | INTRAMUSCULAR | Status: DC | PRN

## 2011-05-11 MED ORDER — CYCLOBENZAPRINE HCL 10 MG PO TABS
5.0000 mg | ORAL_TABLET | Freq: Every day | ORAL | Status: DC
  Administered 2011-05-11: 5 mg via ORAL
  Filled 2011-05-11: qty 1
  Filled 2011-05-11: qty 0.5

## 2011-05-11 MED ORDER — OXYCODONE HCL 5 MG PO TABS
5.0000 mg | ORAL_TABLET | ORAL | Status: DC | PRN
  Administered 2011-05-11 – 2011-05-13 (×8): 5 mg via ORAL
  Filled 2011-05-11 (×8): qty 1

## 2011-05-11 MED ORDER — SODIUM CHLORIDE 0.9 % IV SOLN
INTRAVENOUS | Status: DC
  Administered 2011-05-11: 14:00:00 via INTRAVENOUS

## 2011-05-11 NOTE — ED Provider Notes (Signed)
History     CSN: [id number] Arrival date & time: 05/10/2011  7:06 PM   First MD Initiated Contact with Patient 05/11/11 0121      Chief Complaint  Patient presents with  . Hip Pain    (Consider location/radiation/quality/duration/timing/severity/associated sxs/prior treatment) Patient is a 70 y.o. male presenting with hip pain. The history is provided by the patient and a relative.  Hip Pain This is a recurrent problem. The current episode started in the past 7 days. The problem occurs intermittently. The problem has been gradually worsening. Associated symptoms include numbness and weakness. Pertinent negatives include no arthralgias, headaches or joint swelling. The symptoms are aggravated by walking. The treatment provided no relief.    Past Medical History  Diagnosis Date  . Hypertension   . Diabetes mellitus   . MI (mitral incompetence)     Past Surgical History  Procedure Date  . Back surgery   . Elbow surgery     Family History  Problem Relation Age of Onset  . Heart failure Father   . Heart failure Sister     History  Substance Use Topics  . Smoking status: Former Smoker -- 25 years    Types: Cigarettes    Quit date: 02/27/1987  . Smokeless tobacco: Never Used  . Alcohol Use: No      Review of Systems  HENT: Negative.   Eyes: Negative.   Respiratory: Negative.   Cardiovascular: Negative.   Gastrointestinal: Negative.   Genitourinary: Positive for difficulty urinating.  Musculoskeletal: Positive for back pain and gait problem. Negative for joint swelling and arthralgias.  Neurological: Positive for weakness and numbness. Negative for dizziness and headaches.  Hematological: Negative.   Psychiatric/Behavioral: Negative.     Allergies  Review of patient's allergies indicates no known allergies.  Home Medications   Current Outpatient Rx  Name Route Sig Dispense Refill  . IBUPROFEN 200 MG PO TABS Oral Take 200 mg by mouth 2 (two) times daily.  Scheduled      . NIACIN 100 MG PO TABS Oral Take 100 mg by mouth 2 (two) times daily.        BP 128/73  Pulse 102  Temp(Src) 97.6 F (36.4 C) (Oral)  Resp 16  SpO2 98%  Physical Exam  Constitutional: He appears well-developed and well-nourished.  HENT:  Head: Atraumatic.  Eyes: EOM are normal.  Neck: Normal range of motion.  Cardiovascular: Normal rate.   Pulmonary/Chest: Effort normal and breath sounds normal.  Abdominal: Soft.  Musculoskeletal: Normal range of motion.       Right hip: He exhibits no tenderness, no bony tenderness, no crepitus, no deformity and no laceration.       Lumbar back: He exhibits pain.  Neurological: He is alert.  Skin: Skin is warm and dry. Rash noted. Rash is maculopapular.  Psychiatric: He has a normal mood and affect.    ED Course  Procedures (including critical care time)   Labs Reviewed  CBC  DIFFERENTIAL  URINALYSIS, ROUTINE W REFLEX MICROSCOPIC  I-STAT, CHEM 8   No results found.   No diagnosis found.    MDM  Chronic back pain Neck surgery with chronic arm/hand numbness No intermittent weakness to L leg and frequent falls over the past 2 weeks         Arman Filter, NP 05/11/11 0142

## 2011-05-11 NOTE — Progress Notes (Signed)
  Subjective: Patient seen.Complain of mild left hip pain.Denies any urinary or fecal incontinence  Objective: Weight change:  No intake or output data in the 24 hours ending 05/11/11 1019 BP 155/81  Pulse 92  Temp(Src) 98.1 F (36.7 C) (Oral)  Resp 18  SpO2 97% Physical Exam: General appearance: alert, cooperative and no distress Head: Normocephalic, without obvious abnormality, atraumatic Neck: no adenopathy, no carotid bruit, no JVD, supple, symmetrical, trachea midline and thyroid not enlarged, symmetric, no tenderness/mass/nodules Lungs: clear to auscultation bilaterally Heart: regular rate and rhythm, S1, S2 normal, no murmur, click, rub or gallop Abdomen: soft, non-tender; bowel sounds normal; no masses,  no organomegaly Extremities: extremities normal, atraumatic, no cyanosis or edema Skin: Scattered hypopigmented patches on the arm. [id number] changes in the knees and feet.No decrease range of movement lt hip .  Lab Results: @labtest @  Micro Results: No results found for this or any previous visit (from the past 240 hour(s)).  Studies/Results: Dg Lumbar Spine Complete  05/11/2011  *RADIOLOGY REPORT*  Clinical Data: Back pain status post fall.  LUMBAR SPINE - COMPLETE 4+ VIEW  Comparison: None.  Findings: Multilevel degenerative changes with facet arthropathy and anterior osteophytes. Minimal anterior wedging at L1 and T12. Otherwise, no acute fracture or dislocation identified.  Maintained vertebral body heights.  Atherosclerotic calcification of the aorta.  Otherwise overlying soft tissues are within normal limits.  IMPRESSION: Multilevel degenerative changes.  Minimal anterior wedging at T12 and L1 is not favored to be acute. Correlate with point tenderness.  Original Report Authenticated By: Waneta Martins, M.D.   Dg Pelvis 1-2 Views  05/11/2011  *RADIOLOGY REPORT*  Clinical Data: Left hip pain status post fall.  PELVIS - 1-2 VIEW  Comparison: None.  Findings:  Sacrum is partially obscured by overlying bowel gas.  No displaced fracture of the pelvis identified on this single view. Degenerative changes of the lower lumbar/lumbosacral spine.  Intact SI joints.  No pubic ramus fracture identified.  Femoral heads remain seated within the acetabula. Vascular atherosclerotic calcifications.  IMPRESSION: No acute osseous abnormality identified on this single view of the pelvis.  Original Report Authenticated By: Waneta Martins, M.D.   Ct Head Wo Contrast  05/11/2011  *RADIOLOGY REPORT*  Clinical Data: Fall, trauma to the back of head.  CT HEAD WITHOUT CONTRAST  Technique:  Contiguous axial images were obtained from the base of the skull through the vertex without contrast.  Comparison: None.  Findings: Periventricular and subcortical white matter hypodensities are most in keeping with chronic microangiopathic change. There is no evidence for acute hemorrhage, hydrocephalus, mass lesion, or abnormal extra-axial fluid collection.  No definite CT evidence for acute infarction.  The visualized paranasal sinuses and mastoid air cells are predominately clear. Underpneumatized right mastoid air cells.  No displaced calvarial fracture. Vascular atherosclerotic calcification.  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  No definite acute intracranial abnormality.  Original Report Authenticated By: Waneta Martins, M.D.   Medications: Scheduled Meds:   . insulin aspart  0-9 Units Subcutaneous TID WC  . niacin  100 mg Oral BID   Continuous Infusions:   . sodium chloride     PRN Meds:.acetaminophen, acetaminophen, hydrALAZINE, ondansetron (ZOFRAN) IV, ondansetron, oxyCODONE  Assessment/Plan: 1.Low back pain 2.CAD (coronary artery disease) 3.DM2 (diabetes mellitus, type 2)  4.HTN  Plan-Control blood pressure,await MRI result and consult physical therapy   LOS: 1 day    Herta Hink 05/11/2011, 10:19 AM

## 2011-05-11 NOTE — H&P (Signed)
Roy Hickman is an 70 y.o. male.   Chief Complaint: Difficulty walking HPI: 70 year old male with H/O CAD s/p CABG, DM, previous low back surgery and neck surgery has been experiencing falls and yesterday after the fall had difficulty walking. He has pain in both hips and is not able to put pressure on both legs as his legs give way. Patient states it has been happening like this for a week. Denies any bowel or urine incontinence denies any focal deficits or loss of consciousness. In the ER patient while lying down has normal exam and Xray pelvis and LS Spine does not reveal any definite fractures. As patient has difficulty ambulating patient will be admitted for futher management.  Past Medical History  Diagnosis Date  . Hypertension   . Diabetes mellitus   . MI (mitral incompetence)   . Coronary artery disease     Past Surgical History  Procedure Date  . Back surgery   . Elbow surgery   . Laminectomy   . Cervical spine surgery     Family History  Problem Relation Age of Onset  . Heart failure Father   . Heart failure Sister    Social History:  reports that he quit smoking about 24 years ago. His smoking use included Cigarettes. He quit after 25 years of use. He has never used smokeless tobacco. He reports that he does not drink alcohol or use illicit drugs.  Allergies: No Known Allergies  No current facility-administered medications on file as of 05/10/2011.   No current outpatient prescriptions on file as of 05/10/2011.    Results for orders placed during the hospital encounter of 05/10/11 (from the past 48 hour(s))  CBC     Status: Abnormal   Collection Time   05/11/11  1:38 AM      Component Value Range Comment   WBC 6.9  4.0 - 10.5 (K/uL)    RBC 3.86 (*) 4.22 - 5.81 (MIL/uL)    Hemoglobin 13.2  13.0 - 17.0 (g/dL)    HCT 40.9 (*) 81.1 - 52.0 (%)    MCV 94.8  78.0 - 100.0 (fL)    MCH 34.2 (*) 26.0 - 34.0 (pg)    MCHC 36.1 (*) 30.0 - 36.0 (g/dL)    RDW 91.4  78.2 - 95.6  (%)    Platelets 225  150 - 400 (K/uL)   DIFFERENTIAL     Status: Abnormal   Collection Time   05/11/11  1:38 AM      Component Value Range Comment   Neutrophils Relative 57  43 - 77 (%)    Neutro Abs 4.0  1.7 - 7.7 (K/uL)    Lymphocytes Relative 23  12 - 46 (%)    Lymphs Abs 1.6  0.7 - 4.0 (K/uL)    Monocytes Relative 15 (*) 3 - 12 (%)    Monocytes Absolute 1.0  0.1 - 1.0 (K/uL)    Eosinophils Relative 5  0 - 5 (%)    Eosinophils Absolute 0.3  0.0 - 0.7 (K/uL)    Basophils Relative 0  0 - 1 (%)    Basophils Absolute 0.0  0.0 - 0.1 (K/uL)   POCT I-STAT, CHEM 8     Status: Abnormal   Collection Time   05/11/11  1:50 AM      Component Value Range Comment   Sodium 140  135 - 145 (mEq/L)    Potassium 3.7  3.5 - 5.1 (mEq/L)    Chloride  102  96 - 112 (mEq/L)    BUN 15  6 - 23 (mg/dL)    Creatinine, Ser 4.09  0.50 - 1.35 (mg/dL)    Glucose, Bld 811 (*) 70 - 99 (mg/dL)    Calcium, Ion 9.14  1.12 - 1.32 (mmol/L)    TCO2 26  0 - 100 (mmol/L)    Hemoglobin 12.9 (*) 13.0 - 17.0 (g/dL)    HCT 78.2 (*) 95.6 - 52.0 (%)   URINALYSIS, ROUTINE W REFLEX MICROSCOPIC     Status: Abnormal   Collection Time   05/11/11  2:48 AM      Component Value Range Comment   Color, Urine YELLOW  YELLOW     Appearance CLEAR  CLEAR     Specific Gravity, Urine 1.017  1.005 - 1.030     pH 5.5  5.0 - 8.0     Glucose, UA >1000 (*) NEGATIVE (mg/dL)    Hgb urine dipstick NEGATIVE  NEGATIVE     Bilirubin Urine NEGATIVE  NEGATIVE     Ketones, ur NEGATIVE  NEGATIVE (mg/dL)    Protein, ur NEGATIVE  NEGATIVE (mg/dL)    Urobilinogen, UA 0.2  0.0 - 1.0 (mg/dL)    Nitrite NEGATIVE  NEGATIVE     Leukocytes, UA NEGATIVE  NEGATIVE    URINE MICROSCOPIC-ADD ON     Status: Normal   Collection Time   05/11/11  2:48 AM      Component Value Range Comment   Squamous Epithelial / LPF RARE  RARE     WBC, UA 0-2  <3 (WBC/hpf)    RBC / HPF 0-2  <3 (RBC/hpf)    Bacteria, UA RARE  RARE     Dg Lumbar Spine Complete  05/11/2011   *RADIOLOGY REPORT*  Clinical Data: Back pain status post fall.  LUMBAR SPINE - COMPLETE 4+ VIEW  Comparison: None.  Findings: Multilevel degenerative changes with facet arthropathy and anterior osteophytes. Minimal anterior wedging at L1 and T12. Otherwise, no acute fracture or dislocation identified.  Maintained vertebral body heights.  Atherosclerotic calcification of the aorta.  Otherwise overlying soft tissues are within normal limits.  IMPRESSION: Multilevel degenerative changes.  Minimal anterior wedging at T12 and L1 is not favored to be acute. Correlate with point tenderness.  Original Report Authenticated By: Waneta Martins, M.D.   Dg Pelvis 1-2 Views  05/11/2011  *RADIOLOGY REPORT*  Clinical Data: Left hip pain status post fall.  PELVIS - 1-2 VIEW  Comparison: None.  Findings: Sacrum is partially obscured by overlying bowel gas.  No displaced fracture of the pelvis identified on this single view. Degenerative changes of the lower lumbar/lumbosacral spine.  Intact SI joints.  No pubic ramus fracture identified.  Femoral heads remain seated within the acetabula. Vascular atherosclerotic calcifications.  IMPRESSION: No acute osseous abnormality identified on this single view of the pelvis.  Original Report Authenticated By: Waneta Martins, M.D.    Review of Systems  HENT: Negative.   Eyes: Negative.   Respiratory: Negative.   Cardiovascular: Negative.   Gastrointestinal: Negative.   Genitourinary: Negative.   Musculoskeletal: Positive for back pain, joint pain and falls.  Skin: Negative.   Neurological: Positive for weakness.  Endo/Heme/Allergies: Negative.   Psychiatric/Behavioral: Negative.     Blood pressure 128/73, pulse 102, temperature 97.6 F (36.4 C), temperature source Oral, resp. rate 16, SpO2 98.00%. Physical Exam  Constitutional: He is oriented to person, place, and time. He appears well-developed and well-nourished.  HENT:  Head: Normocephalic  and atraumatic.    Eyes: Conjunctivae and EOM are normal. Pupils are equal, round, and reactive to light.  Neck: Normal range of motion. Neck supple.  Cardiovascular: Normal rate and regular rhythm.   Respiratory: Effort normal and breath sounds normal.  GI: Soft. Bowel sounds are normal.  Musculoskeletal: Normal range of motion.  Neurological: He is alert and oriented to person, place, and time. He has normal reflexes. He displays normal reflexes. No cranial nerve deficit. He exhibits normal muscle tone. Coordination normal.  Skin: Skin is warm.  Psychiatric: His behavior is normal.     Assessment/Plan 1)Difficulty walking 2)Low back pain 3)CAD 4)DM2  Plan: Will admit to regular floor Will get MRI of LS spine as Xray is showing some non specific findings in T10 and L1 and patient is still having difficulty walking and experiencing pain in doing so. Will get PT/OT consult and if symptoms persist then may have to consult Dr [name] Neurosurgeon, who knows the patient well from previous surgeries. Patients medication list has to be reconciled.  Isiah Scheel N. 05/11/2011, 4:34 AM

## 2011-05-11 NOTE — Plan of Care (Signed)
Problem: Phase I Progression Outcomes Goal: Pain controlled with appropriate interventions No complaints of pain on admission to unit.

## 2011-05-11 NOTE — ED Notes (Signed)
Family at bedside. 

## 2011-05-11 NOTE — ED Notes (Signed)
Transported to xray 

## 2011-05-11 NOTE — ED Notes (Signed)
Patient denies pain and is resting comfortably.  

## 2011-05-11 NOTE — ED Notes (Signed)
Patient is resting comfortably.Patient / Family / Caregiver informed of clinical course, understand medical decision-making process, and agree with plan.

## 2011-05-11 NOTE — ED Notes (Signed)
Attempted to give report x2. RN unavailable

## 2011-05-11 NOTE — Plan of Care (Signed)
Problem: Phase I Progression Outcomes Goal: Voiding-avoid urinary catheter unless indicated Voiding without difficulty     

## 2011-05-11 NOTE — ED Provider Notes (Signed)
Medical screening examination/treatment/procedure(s) were performed by non-physician practitioner and as supervising physician I was immediately available for consultation/collaboration.   Hanley Seamen, MD 05/11/11 [phone]

## 2011-05-11 NOTE — ED Notes (Signed)
Report received, assumed care.  

## 2011-05-11 NOTE — ED Notes (Signed)
Pt presents to department for evaluation of multiple falls and dizziness. Pt states he has fallen a lot recently at home. States he becomes dizzy and then falls to floor. Also states problems with legs and hips. Pt states 5/10 L hip pain today. Multiple abrasions to L forearm and elbow from falls. Pt is alert and oriented x4 at the time.

## 2011-05-12 LAB — CBC
MCH: 33.5 pg (ref 26.0–34.0)
Platelets: 206 10*3/uL (ref 150–400)
RBC: 3.61 MIL/uL — ABNORMAL LOW (ref 4.22–5.81)
WBC: 6.5 10*3/uL (ref 4.0–10.5)

## 2011-05-12 LAB — GLUCOSE, CAPILLARY
Glucose-Capillary: 180 mg/dL — ABNORMAL HIGH (ref 70–99)
Glucose-Capillary: 204 mg/dL — ABNORMAL HIGH (ref 70–99)
Glucose-Capillary: 190 mg/dL — ABNORMAL HIGH (ref 70–99)

## 2011-05-12 LAB — COMPREHENSIVE METABOLIC PANEL
ALT: 11 U/L (ref 0–53)
Alkaline Phosphatase: 69 U/L (ref 39–117)
GFR calc Af Amer: >90 mL/min (ref >90)
Glucose, Bld: 199 mg/dL — ABNORMAL HIGH (ref 70–99)
Potassium: 3.4 mEq/L — ABNORMAL LOW (ref 3.5–5.1)
Sodium: 140 mEq/L (ref 135–145)
Total Protein: 5.8 g/dL — ABNORMAL LOW (ref 6.0–8.3)

## 2011-05-12 LAB — DIFFERENTIAL
Eosinophils Absolute: 0.3 10*3/uL (ref 0.0–0.7)
Lymphocytes Relative: 21 % (ref 12–46)
Lymphs Abs: 1.4 10*3/uL (ref 0.7–4.0)
Neutro Abs: 3.9 10*3/uL (ref 1.7–7.7)
Neutrophils Relative %: 60 % (ref 43–77)

## 2011-05-12 MED ORDER — LISINOPRIL 10 MG PO TABS
10.0000 mg | ORAL_TABLET | Freq: Every day | ORAL | Status: DC
  Administered 2011-05-12 – 2011-05-14 (×3): 10 mg via ORAL
  Filled 2011-05-12 (×3): qty 1

## 2011-05-12 MED ORDER — ALPRAZOLAM 0.5 MG PO TABS
1.0000 mg | ORAL_TABLET | Freq: Two times a day (BID) | ORAL | Status: DC | PRN
  Filled 2011-05-12: qty 2

## 2011-05-12 MED ORDER — TAMSULOSIN HCL 0.4 MG PO CAPS
0.4000 mg | ORAL_CAPSULE | Freq: Every day | ORAL | Status: DC
  Administered 2011-05-12 – 2011-05-14 (×3): 0.4 mg via ORAL
  Filled 2011-05-12 (×3): qty 1

## 2011-05-12 MED ORDER — HYDROXYZINE HCL 50 MG PO TABS
50.0000 mg | ORAL_TABLET | Freq: Four times a day (QID) | ORAL | Status: DC | PRN
  Administered 2011-05-13 (×2): 50 mg via ORAL
  Filled 2011-05-12 (×3): qty 1

## 2011-05-12 MED ORDER — CLOPIDOGREL BISULFATE 75 MG PO TABS
75.0000 mg | ORAL_TABLET | Freq: Every day | ORAL | Status: DC
  Filled 2011-05-12 (×2): qty 1

## 2011-05-12 MED ORDER — CARVEDILOL 12.5 MG PO TABS
12.5000 mg | ORAL_TABLET | Freq: Two times a day (BID) | ORAL | Status: DC
Start: 2011-05-12 — End: 2011-05-14
  Administered 2011-05-12 – 2011-05-14 (×4): 12.5 mg via ORAL
  Filled 2011-05-12 (×7): qty 1

## 2011-05-12 MED ORDER — SIMVASTATIN 20 MG PO TABS
20.0000 mg | ORAL_TABLET | Freq: Every day | ORAL | Status: DC
  Administered 2011-05-12 – 2011-05-13 (×2): 20 mg via ORAL
  Filled 2011-05-12 (×3): qty 1

## 2011-05-12 MED ORDER — CYCLOBENZAPRINE HCL 10 MG PO TABS
10.0000 mg | ORAL_TABLET | Freq: Three times a day (TID) | ORAL | Status: DC | PRN
  Administered 2011-05-12 – 2011-05-13 (×2): 10 mg via ORAL
  Filled 2011-05-12 (×3): qty 1

## 2011-05-12 MED ORDER — DIPHENHYDRAMINE HCL 50 MG/ML IJ SOLN
12.5000 mg | Freq: Four times a day (QID) | INTRAMUSCULAR | Status: DC | PRN
  Administered 2011-05-12 – 2011-05-14 (×4): 12.5 mg via INTRAVENOUS
  Filled 2011-05-12 (×5): qty 1

## 2011-05-12 MED ORDER — GLIPIZIDE ER 5 MG PO TB24
5.0000 mg | ORAL_TABLET | Freq: Every day | ORAL | Status: DC
  Administered 2011-05-13 – 2011-05-14 (×2): 5 mg via ORAL
  Filled 2011-05-12 (×3): qty 1

## 2011-05-12 MED ORDER — FOLIC ACID 1 MG PO TABS
1.0000 mg | ORAL_TABLET | Freq: Every day | ORAL | Status: DC
  Administered 2011-05-12 – 2011-05-14 (×3): 1 mg via ORAL
  Filled 2011-05-12 (×3): qty 1

## 2011-05-12 NOTE — Progress Notes (Signed)
  Roy Hickman did have anterior cervical decompression in the past. Now he came to the er with balance and stability problems. Mentally he is alert. He does not want ani intervention. 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 Ct head showed changes secondary to aging Roy Hickman will benefit of having a neurological consult. i do not beliveve his imbalance comes from  The lumbar spine , although he has some history oh moderate neurogenic clauddication.Marland Kitchen

## 2011-05-12 NOTE — Progress Notes (Signed)
Subjective: No new complaints today, still has some subjective weakness of his bilateral upper thigh region. However today she claims to me that this is been going on for at least 4 months. The reason he presented to the ED after 4 months was because of frequent falls.  Objective: Vital signs in last 24 hours: Temp:  [97.9 F (36.6 C)-98 F (36.7 C)] 97.9 F (36.6 C) (11/07 0623) Pulse Rate:  [92-94] 94  (11/07 0623) Resp:  [16-18] 16  (11/07 0623) BP: (135-151)/(75-76) 135/76 mmHg (11/07 0623) SpO2:  [96 %-99 %] 96 % (11/07 0623) Weight change:  There is no height or weight on file to calculate BMI.  Intake/Output from previous day: 11/06 0701 - 11/07 0700 In: 240 [P.O.:240] Out: 250 [Urine:250]   PHYSICAL EXAM: Gen Exam: Awake and alert with clear speech.   Neck: Supple, No JVD.  Chest: B/L Clear.  CVS: S1 S2 Regular, no murmurs.  Abdomen: soft, BS +, non tender, non distended.  Extremities: no edema, warm.   Neurologic: Non Focal.  His strength in his bilateral lower extremities seems to be adequate. He has no gross sensory deficits in his bilateral lower extremities. Skin: No Rash.   Wounds: N/A.    Lab Results:  Basename 05/12/11 0635 05/11/11 1120  WBC 6.5 6.4  HGB 12.1* 12.7*  HCT 34.3* 36.0*  PLT 206 223   CMET CMP     Component Value Date/Time   NA 140 05/12/2011 0635   K 3.4* 05/12/2011 0635   CL 107 05/12/2011 0635   CO2 23 05/12/2011 0635   GLUCOSE 199* 05/12/2011 0635   BUN 11 05/12/2011 0635   CREATININE 0.61 05/12/2011 0635   CALCIUM 9.1 05/12/2011 0635   PROT 5.8* 05/12/2011 0635   ALBUMIN 3.1* 05/12/2011 0635   AST 12 05/12/2011 0635   ALT 11 05/12/2011 0635   ALKPHOS 69 05/12/2011 0635   BILITOT 0.4 05/12/2011 0635   GFRNONAA >90 05/12/2011 0635   GFRAA >90 05/12/2011 0635    LIPIDS @LASTLIPID @ @LASTBNP @ COAGS No results found for this basename: PT:2,INR:2 in the last 72 hours  Studies/Results: Dg Lumbar Spine Complete  05/11/2011  *RADIOLOGY  REPORT*  Clinical Data: Back pain status post fall.  LUMBAR SPINE - COMPLETE 4+ VIEW  Comparison: None.  Findings: Multilevel degenerative changes with facet arthropathy and anterior osteophytes. Minimal anterior wedging at L1 and T12. Otherwise, no acute fracture or dislocation identified.  Maintained vertebral body heights.  Atherosclerotic calcification of the aorta.  Otherwise overlying soft tissues are within normal limits.  IMPRESSION: Multilevel degenerative changes.  Minimal anterior wedging at T12 and L1 is not favored to be acute. Correlate with point tenderness.  Original Report Authenticated By: Waneta Martins, M.D.   Dg Pelvis 1-2 Views  05/11/2011  *RADIOLOGY REPORT*  Clinical Data: Left hip pain status post fall.  PELVIS - 1-2 VIEW  Comparison: None.  Findings: Sacrum is partially obscured by overlying bowel gas.  No displaced fracture of the pelvis identified on this single view. Degenerative changes of the lower lumbar/lumbosacral spine.  Intact SI joints.  No pubic ramus fracture identified.  Femoral heads remain seated within the acetabula. Vascular atherosclerotic calcifications.  IMPRESSION: No acute osseous abnormality identified on this single view of the pelvis.  Original Report Authenticated By: Waneta Martins, M.D.   Ct Head Wo Contrast  05/11/2011  *RADIOLOGY REPORT*  Clinical Data: Fall, trauma to the back of head.  CT HEAD WITHOUT CONTRAST  Technique:  Contiguous  axial images were obtained from the base of the skull through the vertex without contrast.  Comparison: None.  Findings: Periventricular and subcortical white matter hypodensities are most in keeping with chronic microangiopathic change. There is no evidence for acute hemorrhage, hydrocephalus, mass lesion, or abnormal extra-axial fluid collection.  No definite CT evidence for acute infarction.  The visualized paranasal sinuses and mastoid air cells are predominately clear. Underpneumatized right mastoid air cells.   No displaced calvarial fracture. Vascular atherosclerotic calcification.  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  No definite acute intracranial abnormality.  Original Report Authenticated By: Waneta Martins, M.D.   Mr Lumbar Spine Wo Contrast  05/11/2011  *RADIOLOGY REPORT*  Clinical Data: Low back and left leg pain.  History of previous lumbar surgery.  MRI LUMBAR SPINE WITHOUT CONTRAST  Technique:  Multiplanar and multiecho pulse sequences of the lumbar spine were obtained without intravenous contrast.  Comparison: Lumbar spine series 05/11/2011.  Findings: The lumbar vertebral bodies are normally aligned.  They demonstrate normal marrow signal for age.  The last full intervertebral disc space is labeled L5-S1 and this correlates with the plain films.  The conus medullaris terminates at L1-2.  Moderate facet degenerative changes are noted in the lower lumbar spine without definite pars defects.  No significant paraspinal or retroperitoneal process.  L1-2:  No significant findings.  Mild asymmetric left-sided facet disease and slight ligamentum flavum thickening with minimal impression on the posterior lateral aspect of the thecal sac.  No spinal or foraminal stenosis.  L2-3:  Diffuse annular bulge, short pedicles and facet disease with early spinal and bilateral lateral recess stenosis.  No foraminal stenosis.  L3-4:  Diffuse annular bulge, mild osteophytic ridging, short pedicles and facet disease contribute to mild to moderate spinal and bilateral lateral recess stenosis.  Minimal foraminal encroachment bilaterally.  L4-5:  Diffuse bulging annulus, short pedicles and advanced facet disease contribute to moderately severe to severe spinal and bilateral lateral recess stenosis.  There is also mild foraminal stenosis bilaterally.  L5-S1:  Mild bulging annulus and moderate facet disease but no significant spinal or lateral recess  stenosis.  No foraminal stenosis.  There is a small synovial cyst noted on the left but no mass effect.  IMPRESSION:  Multilevel spinal and lateral recess stenosis.  The most significant level is L4-5.  Original Report Authenticated By: P. Loralie Champagne, M.D.    Medications:  Scheduled:   . carvedilol  12.5 mg Oral BID WC  . clopidogrel  75 mg Oral Q breakfast  . folic acid  1 mg Oral Daily  . glipiZIDE  5 mg Oral Q breakfast  . insulin aspart  0-9 Units Subcutaneous TID WC  . lisinopril  10 mg Oral Daily  . niacin  100 mg Oral BID  . simvastatin  20 mg Oral q1800  . Tamsulosin HCl  0.4 mg Oral Daily  . DISCONTD: cyclobenzaprine  5 mg Oral QHS  . DISCONTD: hydrALAZINE  25 mg Oral Q6H    Assessment/Plan: Principal Problem:  *Difficulty walking -This is in the form of some amount of weakness, patient claims that "hips giveaway" . MRI of the lumbosacral spine is noted, I don't really know if his spinal stenosis as the cause of this problem. These x-rays of the hip did not show any significant pathology . He requests neurosurgical evaluation with Dr. [name], I have left a message for Dr. [name] with his RN at his office.  Active Problems:  -)CAD (coronary artery disease) This is currently stable, we will resume Plavix, statin and beta blockers.   -)DM2 (diabetes mellitus, type 2) This is stable we'll continue him on glipizide and place him on sliding scale insulin.   -)HTN (hypertension) Will place him back on Coreg and lisinopril, DC hydralazine.  -) Low back pain We'll use when necessary narcotics or over-the-counter muscle relaxants.  -) Disposition Remain inpatient, if ambulation continues to be a problem he might need skilled nursing facility for subacute rehabilitation on discharge.      Maretta Bees, MD. 05/12/2011, 3:03 PM

## 2011-05-12 NOTE — Progress Notes (Signed)
Physical Therapy Evaluation Patient Details Name: Roy Hickman MRN: [id number] DOB: [birth year] Today's Date: 05/12/2011  Problem List:  Patient Active Problem List  Diagnoses  . Difficulty walking  . CAD (coronary artery disease)  . DM2 (diabetes mellitus, type 2)  . HTN (hypertension)  . Low back pain    Past Medical History:  Past Medical History  Diagnosis Date  . Hypertension   . Diabetes mellitus   . MI (mitral incompetence)   . Coronary artery disease   . Myocardial infarction 2007   Past Surgical History:  Past Surgical History  Procedure Date  . Back surgery   . Elbow surgery   . Laminectomy   . Cervical spine surgery   . Coronary artery bypass graft     PT Assessment/Plan/Recommendation PT Assessment Clinical Impression Statement: Pt. presents with right  LE weakness and left LE pain which inhibits his safety  PT Recommendation/Assessment: Patient will need skilled PT in the acute care venue PT Problem List: Decreased strength;Decreased activity tolerance;Decreased balance;Decreased mobility;Decreased knowledge of use of DME;Decreased safety awareness;Decreased knowledge of precautions Barriers to Discharge: Decreased caregiver support (lives alone) PT Therapy Diagnosis : Difficulty walking;Generalized weakness;Acute pain PT Plan PT Frequency: Min 4X/week PT Treatment/Interventions: DME instruction;Gait training;Functional mobility training;Therapeutic exercise;Balance training;Patient/family education PT Recommendation Recommendations for Other Services: OT consult Follow Up Recommendations: Skilled nursing facility Equipment Recommended: Defer to next venue PT Goals  Acute Rehab PT Goals PT Goal Formulation: With patient Pt will Roll Supine to Right Side: with supervision PT Goal: Rolling Supine to Right Side - Progress: Progressing toward goal Pt will Roll Supine to Left Side: with supervision PT Goal: Rolling Supine to Left Side - Progress:  Progressing toward goal Pt will go Supine/Side to Sit: with supervision PT Goal: Supine/Side to Sit - Progress: Progressing toward goal Pt will go Sit to Supine/Side: with supervision PT Goal: Sit to Supine/Side - Progress: Progressing toward goal Pt will Transfer Sit to Stand/Stand to Sit: with supervision PT Transfer Goal: Sit to Stand/Stand to Sit - Progress: Progressing toward goal Pt will Transfer Bed to Chair/Chair to Bed: with supervision PT Transfer Goal: Bed to Chair/Chair to Bed - Progress: Progressing toward goal Pt will Ambulate: 51 - 150 feet;with least restrictive assistive device;with min assist PT Goal: Ambulate - Progress: Progressing toward goal Pt will Perform Home Exercise Program: with supervision, verbal cues required/provided PT Goal: Perform Home Exercise Program - Progress: Progressing toward goal  PT Evaluation Precautions/Restrictions  Precautions Precautions: Fall Prior Functioning  Home Living Lives With: Alone Receives Help From: Other (Comment);Neighbor (neighbor checks in almost daily by phone, visit  ) Type of Home: Apartment Home Layout: One level Home Access: Level entry Bathroom Shower/Tub: Tub/shower unit;Curtain Firefighter: Standard Home Adaptive Equipment: Walker - rolling;Shower chair without back;Straight cane Prior Function Level of Independence: Independent with basic ADLs;Independent with homemaking with ambulation;Independent with gait;Independent with transfers;Requires assistive device for independence (reports using cane or RW on PRN basis) Driving: Yes Vocation: Retired Financial risk analyst Overall Cognitive Status: Appears within functional limits for tasks assessed Orientation Level: Oriented X4 Sensation/Coordination Sensation Light Touch: Appears Intact Extremity Assessment RLE Assessment RLE Assessment: Exceptions to [facility] RLE AROM (degrees) Overall AROM Right Lower Extremity: Within functional limits for tasks  assessed RLE Strength Right Hip Flexion: 3+/5 Right Hip ABduction: 4/5 Right Hip ADduction: 4/5 Right Knee Flexion: 3+/5 Right Knee Extension: 3+/5 Right Ankle Dorsiflexion: 3+/5 LLE Assessment LLE Assessment: Within Functional Limits Mobility (including Balance) Bed Mobility Bed Mobility: Yes Supine  to Sit: 5: Supervision;HOB elevated (Comment degrees);Other (comment) (use of rail, HOB 35 degrees) Supine to Sit Details (indicate cue type and reason): vc's for safety  Transfers Transfers: Yes Sit to Stand: 3: Mod assist;From bed;With upper extremity assist Sit to Stand Details (indicate cue type and reason): vc's for safety and technique Stand to Sit: 3: Mod assist;With upper extremity assist;To chair/3-in-1 (vc's to control descent) Ambulation/Gait Ambulation/Gait: Yes Ambulation/Gait Assistance: 3: Mod assist Ambulation/Gait Assistance Details (indicate cue type and reason): mod assist for balance, safety and stability Ambulation Distance (Feet): 25 Feet Assistive device: Rolling walker Gait Pattern: Step-to pattern  Balance Balance Assessed: Yes Static Sitting Balance Static Sitting - Balance Support: Feet supported;Bilateral upper extremity supported Static Sitting - Level of Assistance: 4: Min assist (min assist needed for balance ) Dynamic Sitting Balance Dynamic Sitting - Level of Assistance: 4: Min assist Dynamic Sitting Balance - Compensations: leans posteriorly when trying to don socks Static Standing Balance Static Standing - Balance Support: Bilateral upper extremity supported (on RW) Static Standing - Level of Assistance: 4: Min assist Dynamic Standing Balance Dynamic Standing - Balance Support: Bilateral upper extremity supported (on RW) Dynamic Standing - Level of Assistance: 3: Mod assist Exercise  General Exercises - Lower Extremity Ankle Circles/Pumps: AROM;Both;10 reps End of Session PT - End of Session Equipment Utilized During Treatment: Gait belt  (RW) Activity Tolerance: Patient limited by fatigue;Patient limited by pain (pt. has grabbing painin left hip/thigh during amb.) Patient left: in chair;with call bell in reach (verbal education to get up with assist only) Nurse Communication: Mobility status for transfers;Mobility status for ambulation General Behavior During Session: [facility] for tasks performed Cognition: [facility] for tasks performed  Ferman Hamming 05/12/2011, 11:58 AM

## 2011-05-12 NOTE — Progress Notes (Signed)
Inpatient Diabetes Program Recommendations  AACE/ADA: New Consensus Statement on Inpatient Glycemic Control (2009)  Target Ranges:  Prepandial:   less than 140 mg/dL      Peak postprandial:   less than 180 mg/dL (1-2 hours)      Critically ill patients:  140 - 180 mg/dL   Reason for Visit: Fasting blood sugar greater than 180 mg/dl  Inpatient Diabetes Program Recommendations Insulin - Basal: Start Lantus 10 units daily

## 2011-05-13 LAB — GLUCOSE, CAPILLARY: Glucose-Capillary: 216 mg/dL — ABNORMAL HIGH (ref 70–99)

## 2011-05-13 NOTE — Progress Notes (Signed)
Clinical Social Worker provided patient with list of bed offers. Patient to have choice made for skilled nursing tomorrow 05/14/2011. Clinical social work continuing to follow to assist patient with discharge plans.   Roy Hickman [place], [phone]    17:16

## 2011-05-13 NOTE — Progress Notes (Signed)
C/o itching.all over. Has scratch marks and small amt of bleeding where he has scratched himself. benedryl effective

## 2011-05-13 NOTE — Progress Notes (Signed)
Subjective: No new complaints today, still has some subjective weakness of his bilateral upper thigh region. He by neurosurgery today. Actually walked in the hall with help of physical therapy services.  Objective: Vital signs in last 24 hours: Temp:  [97.6 F (36.4 C)-98.7 F (37.1 C)] 97.6 F (36.4 C) (11/08 1314) Pulse Rate:  [58-88] 88  (11/08 1314) Resp:  [18] 18  (11/08 1314) BP: (102-147)/(62-82) 107/62 mmHg (11/08 1314) SpO2:  [97 %-98 %] 98 % (11/08 1314) Weight:  [81.647 kg (180 lb)] 180 lb (81.647 kg) (11/07 1400) Weight change:  There is no height on file to calculate BMI.  Intake/Output from previous day: 11/07 0701 - 11/08 0700 In: 1900 [P.O.:1000; I.V.:900] Out: 600 [Urine:600]   PHYSICAL EXAM: Gen Exam: Awake and alert with clear speech.  Nontoxic appearing. Neck: Supple, No JVD.  Chest: B/L Clear.  CVS: S1 S2 Regular, no murmurs.  Abdomen: soft, BS +, non tender, non distended.  Extremities: no edema, warm.   Neurologic: Non Focal.  His strength in his bilateral lower extremities seems to be adequate. He has no gross sensory deficits in his bilateral lower extremities. Skin: No Rash.   Wounds: N/A.    Lab Results:  Basename 05/12/11 0635 05/11/11 1120  WBC 6.5 6.4  HGB 12.1* 12.7*  HCT 34.3* 36.0*  PLT 206 223   CMET CMP     Component Value Date/Time   NA 140 05/12/2011 0635   K 3.4* 05/12/2011 0635   CL 107 05/12/2011 0635   CO2 23 05/12/2011 0635   GLUCOSE 199* 05/12/2011 0635   BUN 11 05/12/2011 0635   CREATININE 0.61 05/12/2011 0635   CALCIUM 9.1 05/12/2011 0635   PROT 5.8* 05/12/2011 0635   ALBUMIN 3.1* 05/12/2011 0635   AST 12 05/12/2011 0635   ALT 11 05/12/2011 0635   ALKPHOS 69 05/12/2011 0635   BILITOT 0.4 05/12/2011 0635   GFRNONAA >90 05/12/2011 0635   GFRAA >90 05/12/2011 0635    LIPIDS @LASTLIPID @ @LASTBNP @ COAGS No results found for this basename: PT:2,INR:2 in the last 72 hours  Studies/Results: No results found.  Medications:    Scheduled:    . carvedilol  12.5 mg Oral BID WC  . folic acid  1 mg Oral Daily  . glipiZIDE  5 mg Oral Q breakfast  . insulin aspart  0-9 Units Subcutaneous TID WC  . lisinopril  10 mg Oral Daily  . niacin  100 mg Oral BID  . simvastatin  20 mg Oral q1800  . Tamsulosin HCl  0.4 mg Oral Daily  . DISCONTD: clopidogrel  75 mg Oral Q breakfast  . DISCONTD: cyclobenzaprine  5 mg Oral QHS  . DISCONTD: hydrALAZINE  25 mg Oral Q6H    Assessment/Plan: Principal Problem:  *Difficulty walking -This is in the form of some amount of weakness, patient claims that "hips giveaway" . MRI of the lumbosacral spine is noted, I don't really know if his spinal stenosis is the cause of this problem.  -Appreciate neurosurgical input, spoke with Dr. [name] of his morning, patient agreeable for neurosurgical intervention now and Dr. [name] suggests to do a cervical and thoracic myelogram. Unfortunately this cannot be done for at least another 4 days as the patient is on Plavix. -Will see if this can be done as an outpatient, I will let Dr. [name] know about this. -Patient was initially not agreeing to be placed into a skilled nursing facility, however I did speak to him and given the fact  that he lives alone and has fallen multiple times he realized and then is now agreeable. -Dr. [name] does not recommend a MRI of his brain, he also suggests to hold off on neurological evaluation at this point in time as patient now agreeable for neurosurgical intervention is required.  Active Problems:  -)CAD (coronary artery disease) This is currently stable,  will hold Plavix, statin  Continue with beta blockers.   -)DM2 (diabetes mellitus, type 2) This is stable we'll continue him on glipizide and  sliding scale insulin.   -)HTN (hypertension) Controlled with Coreg and lisinopril.  -) Low back pain We'll use when necessary narcotics or over-the-counter muscle relaxants.  -) Disposition Remain inpatient, patient  now agreeable to transfer to a skilled nursing facility for rehabilitation. We'll ask case management to assist and make an outpatient appointment for a myelogram.   Maretta Bees, MD. 05/13/2011, 1:55 PM

## 2011-05-13 NOTE — Progress Notes (Signed)
Spoke with mr Roy Hickman today as well with pt/ot. No question he is having trouble with balance. 1 year ago he had corpectomy of C4 with metal plate. Lumbar stenosis present but not the source of his imbalance. Mr Roy Hickman agrees with workup to find the source of his imbalance. I spoke with his doctor and we are going to obtain a cervico-thoracic myelogram.Once we know the results i will talk to him.

## 2011-05-13 NOTE — Progress Notes (Signed)
Physical Therapy Treatment Patient Details Name: Roy Hickman MRN: [id number] DOB: Dec 20, 1940 Today's Date: 05/13/2011  PT Assessment/Plan   PT Goals  Acute Rehab PT Goals PT Goal: Rolling Supine to Right Side - Progress: Progressing toward goal PT Goal: Supine/Side to Sit - Progress: Progressing toward goal PT Transfer Goal: Sit to Stand/Stand to Sit - Progress: Progressing toward goal PT Transfer Goal: Bed to Chair/Chair to Bed - Progress: Progressing toward goal PT Goal: Ambulate - Progress: Progressing toward goal PT Goal: Perform Home Exercise Program - Progress: Progressing toward goal  PT Treatment Precautions/Restrictions  Precautions Precautions: Fall Restrictions Weight Bearing Restrictions: No Mobility (including Balance) Bed Mobility Supine to Sit: 4: Min assist;With rails;HOB elevated (Comment degrees) (24) Transfers Sit to Stand: 4: Min assist Sit to Stand Details (indicate cue type and reason): vc's for hand placement and safe technique Stand to Sit: 4: Min assist Stand to Sit Details: vc's for hand placement and min assist for controlled descent Ambulation/Gait Ambulation/Gait Assistance: 4: Min assist Ambulation/Gait Assistance Details (indicate cue type and reason): vc's for safe tecnique; needed 2 standing rest breaks Ambulation Distance (Feet): 100 Feet Assistive device: Rolling walker Gait Pattern: Right genu recurvatum    Exercise  General Exercises - Lower Extremity Ankle Circles/Pumps: AROM;Both End of Session PT - End of Session Equipment Utilized During Treatment: Gait belt (rolling walker) Activity Tolerance: Patient limited by fatigue;Patient limited by pain Patient left: in chair (reminded pt.  up with assist only) Nurse Communication: Mobility status for ambulation;Mobility status for transfers General Behavior During Session: [facility] for tasks performed Cognition: [facility] for tasks performed  Ferman Hamming 05/13/2011, 10:56 AM

## 2011-05-13 NOTE — Progress Notes (Signed)
Clinical Social Worker completed psychosocial assessment and placed in shadow chart. Pt initially refused SNF placement, but now agreeable. Clinical Child psychotherapist to complete FL-2 and initiate SNF search in [place] and [place] at the request of the pt. Clinical Social Worker to follow-up with pt in regard to bed offers. Clinical Social Worker to facilitate pt D/C needs.  Jacklynn Lewis, MSW, LCSWA  Clinical Social Work [phone]

## 2011-05-14 LAB — GLUCOSE, CAPILLARY
Glucose-Capillary: 165 mg/dL — ABNORMAL HIGH (ref 70–99)
Glucose-Capillary: 201 mg/dL — ABNORMAL HIGH (ref 70–99)

## 2011-05-14 MED ORDER — NIACIN ER (ANTIHYPERLIPIDEMIC) 1000 MG PO TBCR: 1000.0000 mg | EXTENDED_RELEASE_TABLET | Freq: Two times a day (BID) | ORAL | Status: DC

## 2011-05-14 MED ORDER — METFORMIN HCL 1000 MG PO TABS: 1000.0000 mg | ORAL_TABLET | Freq: Two times a day (BID) | ORAL | Status: DC

## 2011-05-14 MED ORDER — OMEPRAZOLE 20 MG PO CPDR: 20.0000 mg | DELAYED_RELEASE_CAPSULE | Freq: Every day | ORAL | Status: DC

## 2011-05-14 MED ORDER — OXYCODONE-ACETAMINOPHEN 7.5-325 MG PO TABS: 1.0000 | ORAL_TABLET | Freq: Three times a day (TID) | ORAL | Status: DC | PRN

## 2011-05-14 MED ORDER — LIDOCAINE 5 % EX PTCH: 1.0000 | MEDICATED_PATCH | CUTANEOUS | Status: DC

## 2011-05-14 MED ORDER — ALPRAZOLAM 1 MG PO TABS: 1.0000 mg | ORAL_TABLET | Freq: Two times a day (BID) | ORAL | Status: DC | PRN

## 2011-05-14 NOTE — Discharge Summary (Addendum)
Physician Discharge Summary  Patient ID: Roy Hickman MRN: [id number] DOB/AGE: 72-Jul-1942 72 y.o. Primary Care Physician:JONES, ANGELA, PA Admit date: 05/10/2011 Discharge date: 05/14/2011   Discharge Diagnoses:   Principal Problem:  *Difficulty walking  Active Problems:  CAD (coronary artery disease)  DM2 (diabetes mellitus, type 2)  HTN (hypertension)  Low back pain   Current Discharge Medication List    CONTINUE these medications which have CHANGED   Details  ALPRAZolam (XANAX) 1 MG tablet Take 1 tablet (1 mg total) by mouth 2 (two) times daily as needed for anxiety. Qty: 15 tablet, Refills: 0    lidocaine ([name]) 5 % Place 1-2 patches onto the skin See admin instructions. Apply to affected area on 12 hours and off 12 hours Qty: 10 patch, Refills: 0    metFORMIN ([name]) 1000 MG tablet Take 1 tablet (1,000 mg total) by mouth 2 (two) times daily with a meal. Qty: 30 tablet, Refills: 0    niacin ([name]) 1000 MG CR tablet Take 1 tablet (1,000 mg total) by mouth 2 (two) times daily. Qty: 30 tablet, Refills: 0    omeprazole (PRILOSEC) 20 MG capsule Take 1 capsule (20 mg total) by mouth daily. Qty: 30 capsule, Refills: 0    oxyCODONE-acetaminophen (PERCOCET) 7.5-325 MG per tablet Take 1 tablet by mouth every 8 (eight) hours as needed. For pain. Qty: 15 tablet, Refills: 0      CONTINUE these medications which have NOT CHANGED   Details  ibuprofen (ADVIL,MOTRIN) 200 MG tablet Take 200 mg by mouth 2 (two) times daily. Scheduled      niacin 100 MG tablet Take 100 mg by mouth 2 (two) times daily.      atorvastatin (LIPITOR) 40 MG tablet Take 40 mg by mouth daily.      carvedilol (COREG) 12.5 MG tablet Take 12.5 mg by mouth 2 (two) times daily with a meal.      cyclobenzaprine (FLEXERIL) 10 MG tablet Take 10 mg by mouth 3 (three) times daily as needed. For muscle spasm.     folic acid ([name]) 1 MG tablet Take 1 mg by mouth daily.      glipiZIDE ([name])  5 MG 24 hr tablet Take 5 mg by mouth daily.      hydrOXYzine ([name]) 50 MG tablet Take 50 mg by mouth every 6 (six) hours as needed. For itching.     lisinopril ([name],[name]) 10 MG tablet Take 10 mg by mouth daily.      Tamsulosin HCl (FLOMAX) 0.4 MG CAPS Take 0.4 mg by mouth daily.        STOP taking these medications     AMLODIPINE BESYLATE PO      Bromfenac Sodium (BROMDAY) 0.09 % SOLN      clopidogrel (PLAVIX) 75 MG tablet      gatifloxacin (ZYMAXID) 0.5 % SOLN      prednisoLONE acetate (PRED FORTE) 1 % ophthalmic suspension      predniSONE (DELTASONE) 10 MG tablet         Discharged Condition: Stable   Consults: Neurosurgery  Significant Diagnostic Studies: Dg Lumbar Spine Complete  05/11/2011  *RADIOLOGY REPORT*  Clinical Data: Back pain status post fall.  LUMBAR SPINE - COMPLETE 4+ VIEW  Comparison: None.  Findings: Multilevel degenerative changes with facet arthropathy and anterior osteophytes. Minimal anterior wedging at L1 and T12. Otherwise, no acute fracture or dislocation identified.  Maintained vertebral body heights.  Atherosclerotic calcification of the aorta.  Otherwise overlying soft tissues are  within normal limits.  IMPRESSION: Multilevel degenerative changes.  Minimal anterior wedging at T12 and L1 is not favored to be acute. Correlate with point tenderness.  Original Report Authenticated By: Waneta Martins, M.D.   Dg Pelvis 1-2 Views  05/11/2011  *RADIOLOGY REPORT*  Clinical Data: Left hip pain status post fall.  PELVIS - 1-2 VIEW  Comparison: None.  Findings: Sacrum is partially obscured by overlying bowel gas.  No displaced fracture of the pelvis identified on this single view. Degenerative changes of the lower lumbar/lumbosacral spine.  Intact SI joints.  No pubic ramus fracture identified.  Femoral heads remain seated within the acetabula. Vascular atherosclerotic calcifications.  IMPRESSION: No acute osseous abnormality identified on  this single view of the pelvis.  Original Report Authenticated By: Waneta Martins, M.D.   Ct Head Wo Contrast  05/11/2011  *RADIOLOGY REPORT*  Clinical Data: Fall, trauma to the back of head.  CT HEAD WITHOUT CONTRAST  Technique:  Contiguous axial images were obtained from the base of the skull through the vertex without contrast.  Comparison: None.  Findings: Periventricular and subcortical white matter hypodensities are most in keeping with chronic microangiopathic change. There is no evidence for acute hemorrhage, hydrocephalus, mass lesion, or abnormal extra-axial fluid collection.  No definite CT evidence for acute infarction.  The visualized paranasal sinuses and mastoid air cells are predominately clear. Underpneumatized right mastoid air cells.  No displaced calvarial fracture. Vascular atherosclerotic calcification.  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  No definite acute intracranial abnormality.  Original Report Authenticated By: Waneta Martins, M.D.   Mr Lumbar Spine Wo Contrast  05/11/2011  *RADIOLOGY REPORT*  Clinical Data: Low back and left leg pain.  History of previous lumbar surgery.  MRI LUMBAR SPINE WITHOUT CONTRAST  Technique:  Multiplanar and multiecho pulse sequences of the lumbar spine were obtained without intravenous contrast.  Comparison: Lumbar spine series 05/11/2011.  Findings: The lumbar vertebral bodies are normally aligned.  They demonstrate normal marrow signal for age.  The last full intervertebral disc space is labeled L5-S1 and this correlates with the plain films.  The conus medullaris terminates at L1-2.  Moderate facet degenerative changes are noted in the lower lumbar spine without definite pars defects.  No significant paraspinal or retroperitoneal process.  L1-2:  No significant findings.  Mild asymmetric left-sided facet disease and slight ligamentum flavum thickening with minimal  impression on the posterior lateral aspect of the thecal sac.  No spinal or foraminal stenosis.  L2-3:  Diffuse annular bulge, short pedicles and facet disease with early spinal and bilateral lateral recess stenosis.  No foraminal stenosis.  L3-4:  Diffuse annular bulge, mild osteophytic ridging, short pedicles and facet disease contribute to mild to moderate spinal and bilateral lateral recess stenosis.  Minimal foraminal encroachment bilaterally.  L4-5:  Diffuse bulging annulus, short pedicles and advanced facet disease contribute to moderately severe to severe spinal and bilateral lateral recess stenosis.  There is also mild foraminal stenosis bilaterally.  L5-S1:  Mild bulging annulus and moderate facet disease but no significant spinal or lateral recess stenosis.  No foraminal stenosis.  There is a small synovial cyst noted on the left but no mass effect.  IMPRESSION:  Multilevel spinal and lateral recess stenosis.  The most significant level is L4-5.  Original Report Authenticated By: P. Loralie Champagne, M.D.    Lab Results: Results for orders placed during the hospital encounter of 05/10/11 (from the past 48  hour(s))  GLUCOSE, CAPILLARY     Status: Abnormal   Collection Time   05/12/11  4:34 PM      Component Value Range Comment   Glucose-Capillary 204 (*) 70 - 99 (mg/dL)   GLUCOSE, CAPILLARY     Status: Abnormal   Collection Time   05/12/11 11:28 PM      Component Value Range Comment   Glucose-Capillary 190 (*) 70 - 99 (mg/dL)   GLUCOSE, CAPILLARY     Status: Abnormal   Collection Time   05/13/11  6:52 AM      Component Value Range Comment   Glucose-Capillary 216 (*) 70 - 99 (mg/dL)   GLUCOSE, CAPILLARY     Status: Abnormal   Collection Time   05/13/11 10:58 AM      Component Value Range Comment   Glucose-Capillary 182 (*) 70 - 99 (mg/dL)    Comment 1 Notify RN     GLUCOSE, CAPILLARY     Status: Abnormal   Collection Time   05/13/11  4:06 PM      Component Value Range Comment    Glucose-Capillary 239 (*) 70 - 99 (mg/dL)    Comment 1 Notify RN     GLUCOSE, CAPILLARY     Status: Abnormal   Collection Time   05/13/11  9:55 PM      Component Value Range Comment   Glucose-Capillary 196 (*) 70 - 99 (mg/dL)    Comment 1 Notify RN     GLUCOSE, CAPILLARY     Status: Abnormal   Collection Time   05/14/11  6:48 AM      Component Value Range Comment   Glucose-Capillary 165 (*) 70 - 99 (mg/dL)    Comment 1 Notify RN     GLUCOSE, CAPILLARY     Status: Abnormal   Collection Time   05/14/11 11:13 AM      Component Value Range Comment   Glucose-Capillary 201 (*) 70 - 99 (mg/dL)    No results found for this or any previous visit (from the past 240 hour(s)).   Hospital Course:  -) Lower extremity weakness and frequent falls. -Patient presented to the ED because of frequent falls, he claims that while ambulating his "hips just giveaway". Apparently this has been going on for the past 1 or 2 months and only reason he presented to the ED was because he was falling frequently. -On exam he does not appear to have any focal deficits, address he does appear to have adequate strength on his lower extremities. -A CT of the head done on admission did not show any acute intracranial abnormality. MRI of his lumbar spine showed multilevel spinal and lateral recess stenosis, the most significant was at level L4 and L5. Patient has had prior spinal procedures done by Dr. [name] been ordered neurosurgery, as a result he was consulted. Initially the patient refused any further intervention, however the next day upon further evaluation by Dr. [name] he agreed to have intervention if it was deemed necessary. -It is at this time felt that the lumbar stenosis is actually not causing the symptoms, however he does need evaluation of his thoracic and cervical spine to see if his symptoms can be attributed to that region. Dr. [name] also felt that, he did not need further imaging of his brain with an MRI. He  thought that since the patient was agreeable to surgical intervention, the first step was to see and evaluate his cervical and thoracic spine, before embarking on  further interventions and neurology consultation. -Due to the fact that he has hardware in his spine Dr. [name] did not recommend MRI of his thoracic and cervical spine, he did suggest we go ahead with a cervical and thoracic myelogram. That was ordered however this patient is on Plavix, radiology suggested that we hold Plavix for at least 5 days before doing this procedure. Patient claims that his last does was most likely either on Tuesday or Monday. -I've spoken with Dr. [name] today, he feels that this can be done as an outpatient and patient should be okay for discharge. Unfortunately this patient lives alone, because of his significant high fall risk, physical therapy has recommended he be transferred to a skilled nursing facility. Patient initially was very reluctant however, he reluctantly agreed. Our case worker has set up an appointment for the patient at [facility] radiology for a myelogram of his thoracic and cervical region to be done on November  the 13th. Those results as needed to be forwarded to Dr. [name] for evaluation to see if the patient would benefit from further surgical intervention. -Patient would also need to be n.p.o. for 4 hours prior to the procedure on Tuesday.  -)CAD (coronary artery disease) -Patient is status post CABG. This is stable during this hospitalization. -This is currently stable, will hold Plavix, continue with statin and Continue with beta blockers. -We need to resume Plavix after the myelogram is completed.  -)DM2 (diabetes mellitus, type 2)  This is stable we'll continue him on glipizide and metformin.  -)HTN (hypertension)  Controlled with Coreg and lisinopril.  -) Dyslipidemia -Continue with statin and niacin.  -) Chronic low back pain -Continue with as needed narcotics and  Flexeril.   Discharge Exam: Blood pressure 130/80, pulse 98, temperature 98 F (36.7 C), temperature source Oral, resp. rate 18, weight 81.647 kg (180 lb), SpO2 99.00%.  #1. General exam-awake alert speech clear. #2. HEENT-atraumatic normocephalic, pupils are equally reactive to light and accommodation. #3. Chest- bilaterally clear to auscultation. #4. Cardiovascular-heart sounds are regular. #5. Abdomen-bowel sounds are present, abdomen soft nontender and nondistended. #6. Extremities-no edema in the lower extremities. Lower extremities are warm to touch. #7. Neurology-patient is nonfocal. He seems to have adequate strength in his lower extremity at rest, however on ambulation he is unsteady.  Disposition: Patient is stable to be discharged to a skilled nursing facility.  Total time spent for discharge-45 minutes   Discharge Orders    Future Appointments: Provider: Department: Dept Phone: Center:   05/18/2011 1:00 PM [facility] Dg C-Arm Rm 3 [facility] Diagnostic [phone] [facility] [address] [facility]   05/18/2011 1:30 PM [facility] Ct 1 [facility] Ct Imaging [phone] [facility] [address] [facility]   05/18/2011 2:00 PM [facility] Ct 1 [facility] Ct Imaging [phone] [facility] [address] [facility]     Future Orders Please Complete By Expires   Diet - low sodium heart healthy      Diet Carb Modified      Increase activity slowly         Follow-up Information    Follow up with BOTERO,ERNESTO M. (Patient is scheduled for cervical and thoracic myelogram on  05-18-11, those reports need to be sent to Dr Cassandria Santee office for evaluation ) patient will need followup with Dr. [name] as soon as cervical and thoracic myelogram results are available.    Contact information:   [address], [address] [place] [address] [phone]       Follow up with An[facility] Boss.-1-2 weeks after discharge from  the skilled nursing facility.    Contact information:   [address] [place] [address] [phone]        Follow up with [facility] [phone] @[phone] [employer] on 05/18/2011. (Patient is to have an outpatient myelogram of cervical and thoracic spine on 05/18/11 at 1230. Patient must be NPO for four hours prior to procedure with the exception of medications( take meds w/ sips of H2O). Bedrest for 24hrs after procedure . as need)          SignedJeoffrey Massed 05/14/2011, 12:56 PM

## 2011-05-14 NOTE — Progress Notes (Signed)
Physical Therapy Treatment Patient Details Name: Roy Hickman MRN: [id number] DOB: Oct 02, 1940 Today's Date: 05/14/2011  PT Assessment/Plan  PT - Assessment/Plan Comments on Treatment Session: Pt. noted to be up in room ad lib using unsafe technique.  Re-educated pt on need for up with assist only due to fall risk.  Continue to recommend SNF for rehab before DC home. PT Plan: Discharge plan remains appropriate PT Frequency: Min 4X/week Follow Up Recommendations: Skilled nursing facility Equipment Recommended: Defer to next venue PT Goals  Acute Rehab PT Goals PT Transfer Goal: Sit to Stand/Stand to Sit - Progress: Progressing toward goal PT Goal: Ambulate - Progress: Progressing toward goal  PT Treatment Precautions/Restrictions  Precautions Precautions: Fall Restrictions Weight Bearing Restrictions: No Mobility (including Balance) Bed Mobility Bed Mobility: No Transfers Transfers: Yes Sit to Stand: 5: Supervision Sit to Stand Details (indicate cue type and reason): for safety Stand to Sit: 5: Supervision;4: Min assist Stand to Sit Details: for safety and to control descent Ambulation/Gait Ambulation/Gait: Yes Ambulation/Gait Assistance: 4: Min assist Ambulation/Gait Assistance Details (indicate cue type and reason): . Pt.needed standing rest due to onset of hip pain.  Unsafe technique  Ambulation Distance (Feet): 40 Feet Assistive device: Rolling walker Gait Pattern: Decreased stance time - left;Antalgic  Static Standing Balance Static Standing - Balance Support: Bilateral upper extremity supported (noted pt. up in room "furniture walking") Exercise    End of Session PT - End of Session Equipment Utilized During Treatment: Gait belt;Other (comment) (rollling walker) Activity Tolerance: Patient limited by pain Patient left: in chair;with call bell in reach Nurse Communication: Mobility status for ambulation General Behavior During Session: [facility] for tasks  performed Cognition: [facility] for tasks performed  Ferman Hamming 05/14/2011, 1:49 PM Acute Rehabilitation Services [phone] (pager)

## 2011-05-14 NOTE — Progress Notes (Signed)
Clinical Social Worker met with pt at bedside to discuss bed offers. Pt chooses bed at [facility]. Clinical Social Worker contacted pt sister with the permission of pt to discuss pt transfer to [facility]. Clinical Social Worker to facilitate pt D/C needs.   Jacklynn Lewis, MSW, LCSWA  Clinical Social Work [phone]

## 2011-05-14 NOTE — Progress Notes (Signed)
Clinical Social Worker facilitated pt D/C needs including contacting facility, family, and arranging ambulance transportation to [facility] [place] via ambulance. No further CSW needs at this time.  Jacklynn Lewis, MSW, LCSWA  Clinical Social Work [phone]

## 2011-05-18 ENCOUNTER — Other Ambulatory Visit: Payer: Medicare Other

## 2011-05-18 ENCOUNTER — Ambulatory Visit (HOSPITAL_COMMUNITY): Admission: RE | Admit: 2011-05-18 | Payer: Medicare Other | Source: Ambulatory Visit

## 2011-05-20 ENCOUNTER — Other Ambulatory Visit: Payer: Self-pay | Admitting: Neurosurgery

## 2011-05-20 DIAGNOSIS — R2681 Unsteadiness on feet: Secondary | ICD-10-CM

## 2011-05-20 DIAGNOSIS — M549 Dorsalgia, unspecified: Secondary | ICD-10-CM

## 2011-05-20 DIAGNOSIS — R2689 Other abnormalities of gait and mobility: Secondary | ICD-10-CM

## 2011-05-26 ENCOUNTER — Other Ambulatory Visit: Payer: Medicare Other

## 2011-05-26 ENCOUNTER — Ambulatory Visit
Admission: RE | Admit: 2011-05-26 | Discharge: 2011-05-26 | Disposition: A | Discharge status: Home / Self Care | Payer: Medicare Other | Source: Ambulatory Visit | Attending: Neurosurgery | Admitting: Neurosurgery

## 2011-05-26 ENCOUNTER — Ambulatory Visit
Admission: RE | Admit: 2011-05-26 | Discharge: 2011-05-26 | Disposition: A | Discharge status: Home / Self Care | Payer: Medicare Other | Source: Ambulatory Visit | Attending: Internal Medicine | Admitting: Internal Medicine

## 2011-05-26 DIAGNOSIS — R2681 Unsteadiness on feet: Secondary | ICD-10-CM

## 2011-05-26 DIAGNOSIS — M545 Low back pain: Secondary | ICD-10-CM

## 2011-05-26 DIAGNOSIS — R2689 Other abnormalities of gait and mobility: Secondary | ICD-10-CM

## 2011-05-26 DIAGNOSIS — M549 Dorsalgia, unspecified: Secondary | ICD-10-CM

## 2011-05-26 MED ORDER — DIAZEPAM 5 MG PO TABS
5.0000 mg | ORAL_TABLET | Freq: Once | ORAL | Status: AC
  Administered 2011-05-26: 5 mg via ORAL

## 2011-05-26 MED ORDER — IOHEXOL 300 MG/ML  SOLN
10.0000 mL | Freq: Once | INTRAMUSCULAR | Status: AC | PRN
  Administered 2011-05-26: 10 mL via INTRATHECAL

## 2011-05-26 NOTE — Patient Instructions (Signed)
Myelogram Discharge Instructions  1. Go home and rest quietly for the next 24 hours.  It is important to lie flat for the next 24 hours.  Get up only to go to the restroom.  You may lie in the bed or on a couch on your back, your stomach, your left side or your right side.  You may have one pillow under your head.  You may have pillows between your knees while you are on your side or under your knees while you are on your back.  2. DO NOT drive today.  Recline the seat as far back as it will go, while still wearing your seat belt, on the way home.  3. You may get up to go to the bathroom as needed.  You may sit up for 10 minutes to eat.  You may resume your normal diet and medications unless otherwise indicated.  4. The incidence of headache, nausea, or vomiting is about 5% (one in 20 patients).  If you develop a headache, lie flat and drink plenty of fluids until the headache goes away.  Caffeinated beverages may be helpful.  If you develop severe nausea and vomiting or a headache that does not go away with flat bed rest, call [phone].  5. You may resume normal activities after your 24 hours of bed rest is over; however, do not exert yourself strongly or do any heavy lifting tomorrow.  6. Call your physician for a follow-up appointment.  The results of your myelogram will be sent directly to your physician by the following day.  7. If you have any questions or if complications develop after you arrive home, please call [phone].  Discharge instructions have been explained to the patient.  The patient, or the person responsible for the patient, fully understands these instructions.   RESUME PLAVIX TODAY.

## 2011-05-26 NOTE — Progress Notes (Signed)
Pt has been off plavix for the last 5 days, discharge instructions explained. Consent signed.

## 2011-06-10 ENCOUNTER — Other Ambulatory Visit: Payer: Self-pay | Admitting: Neurosurgery

## 2011-06-22 ENCOUNTER — Encounter (HOSPITAL_COMMUNITY)
Admission: RE | Admit: 2011-06-22 | Discharge: 2011-06-22 | Disposition: A | Discharge status: Home / Self Care | Payer: Medicare Other | Source: Ambulatory Visit | Attending: Neurosurgery | Admitting: Neurosurgery

## 2011-06-22 ENCOUNTER — Other Ambulatory Visit: Payer: Self-pay

## 2011-06-22 ENCOUNTER — Encounter (HOSPITAL_COMMUNITY)
Admission: RE | Admit: 2011-06-22 | Discharge: 2011-06-22 | Disposition: A | Discharge status: Home / Self Care | Payer: Medicare Other | Source: Ambulatory Visit | Attending: Anesthesiology | Admitting: Anesthesiology

## 2011-06-22 ENCOUNTER — Encounter (HOSPITAL_COMMUNITY): Payer: Self-pay

## 2011-06-22 DIAGNOSIS — Z01810 Encounter for preprocedural cardiovascular examination: Secondary | ICD-10-CM | POA: Insufficient documentation

## 2011-06-22 DIAGNOSIS — Z01812 Encounter for preprocedural laboratory examination: Secondary | ICD-10-CM | POA: Insufficient documentation

## 2011-06-22 DIAGNOSIS — Z01818 Encounter for other preprocedural examination: Secondary | ICD-10-CM | POA: Insufficient documentation

## 2011-06-22 HISTORY — DX: Gastro-esophageal reflux disease without esophagitis: K21.9

## 2011-06-22 LAB — BASIC METABOLIC PANEL
Calcium: 9.4 mg/dL (ref 8.4–10.5)
GFR calc Af Amer: >90 mL/min (ref >90)
GFR calc non Af Amer: 89 mL/min — ABNORMAL LOW (ref >90)
Sodium: 139 mEq/L (ref 135–145)

## 2011-06-22 LAB — CBC
MCH: 33.1 pg (ref 26.0–34.0)
Platelets: 225 10*3/uL (ref 150–400)
RBC: 3.93 MIL/uL — ABNORMAL LOW (ref 4.22–5.81)
WBC: 8.1 10*3/uL (ref 4.0–10.5)

## 2011-06-22 LAB — PROTIME-INR: Prothrombin Time: 14.1 seconds (ref 11.6–15.2)

## 2011-06-22 LAB — TYPE AND SCREEN

## 2011-06-22 LAB — SURGICAL PCR SCREEN: MRSA, PCR: POSITIVE — AB

## 2011-06-22 LAB — APTT: aPTT: 26 seconds (ref 24–37)

## 2011-06-22 NOTE — Progress Notes (Signed)
sehv called for office notes, any tests, ekg.brian tech called

## 2011-06-22 NOTE — Pre-Procedure Instructions (Signed)
[address] THOR[address] HOSANG  06/22/2011   Your procedure is scheduled on:  06/25/11  Report to [facility] at 700 AM.  Call this number if you have problems the morning of surgery: [phone]   Remember:   Do not eat food:After Midnight.  May have clear liquids: up to 4 Hours before arrival.  Clear liquids include soda, tea, black coffee, apple or grape juice, broth.  Take these medicines the morning of surgery with A SIP OF WATER: xanax, prilosec, pain med, flexerill, carvedilol, flomax,  stop asa, advil herbal meds, blood thinners now   Do not wear jewelry, make-up or nail polish.  Do not wear lotions, powders, or perfumes. You may wear deodorant.  Do not shave 48 hours prior to surgery.  Do not bring valuables to the hospital.  Contacts, dentures or bridgework may not be worn into surgery.  Leave suitcase in the car. After surgery it may be brought to your room.  For patients admitted to the hospital, checkout time is 11:00 AM the day of discharge.   Patients discharged the day of surgery will not be allowed to drive home.  Name and phone number of your driver:[facility] [phone]  [place] sister will also be here  Special Instructions: CHG Shower Use Special Wash: 1/2 bottle night before surgery and 1/2 bottle morning of surgery.   Please read over the following fact sheets that you were given: Pain Booklet, Coughing and Deep Breathing, MRSA Information and Surgical Site Infection Prevention

## 2011-06-23 ENCOUNTER — Encounter (HOSPITAL_COMMUNITY): Payer: Self-pay | Admitting: Vascular Surgery

## 2011-06-23 NOTE — Consult Note (Signed)
Anesthesia:  Patient is a 70 year old male scheduled for a C3-T1 laminectomy, posteriolateral arthrodesis on 06/25/11.  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    His primary Cardiologist is Dr. Allyson Sabal.  His last OV was on 05/13/10.  He rec: a Persantine Myoview to be done this year prior to his yearly follow-up.  According to [facility], this has not been done yet.  Currently his last stress test was on 08/17/06 which showed a mild perfusion defect due to infarct/scar with mod-severe peri-infarct ischemia seen in the mid-anterior, mild anterolateral and apical lateral regions correlating with his known occluded CX.  His EF was 52%.  His last echo was also on 08/17/06 showing EF 35-40%, mild MR/TR, his AV was mildly sclerotic.  His last cath was in 2007 prior to his CABG.  He did tolerate C4-5 corpectomies, foraminotomy C3-4, C4-5 and C5-6 fusion in September of 2011.  His EKG is stable, showing NSR and inferior infarct.  His preoperative CXR shows no acute process and preoperative labs are acceptable from an Anesthesia standpoint.  I have contacted Shanda Bumps at [facility] asking that they notify Dr. Allyson Sabal of the planned procedure to see if he thinks the stress test should be done preoperatively since previously canceled.  Plan reviewed with Dr. Chaney Malling as well.  Update: Dr. Allyson Sabal requests that the stress test be done prior to surgery.  Dr. Cassandria Santee office plans to reschedule once he is cleared from a Cardiac standpoint.

## 2011-06-25 ENCOUNTER — Encounter (HOSPITAL_COMMUNITY): Admission: RE | Payer: Self-pay | Source: Ambulatory Visit

## 2011-06-25 ENCOUNTER — Inpatient Hospital Stay (HOSPITAL_COMMUNITY): Admission: RE | Admit: 2011-06-25 | Payer: Medicare Other | Source: Ambulatory Visit | Admitting: Neurosurgery

## 2011-06-25 SURGERY — POSTERIOR CERVICAL FUSION/FORAMINOTOMY LEVEL 5
Anesthesia: General

## 2011-07-14 ENCOUNTER — Other Ambulatory Visit: Payer: Self-pay | Admitting: Neurosurgery

## 2011-07-20 MED ORDER — CEFAZOLIN SODIUM-DEXTROSE 2-3 GM-% IV SOLR
2.0000 g | INTRAVENOUS | Status: AC
  Administered 2011-07-21: 2 g via INTRAVENOUS
  Filled 2011-07-20: qty 50

## 2011-07-21 ENCOUNTER — Encounter (HOSPITAL_COMMUNITY): Payer: Self-pay | Admitting: Vascular Surgery

## 2011-07-21 ENCOUNTER — Encounter (HOSPITAL_COMMUNITY)
Admission: RE | Disposition: A | Discharge status: Skilled Nursing Facility | Payer: Self-pay | Source: Ambulatory Visit | Attending: Neurosurgery

## 2011-07-21 ENCOUNTER — Encounter (HOSPITAL_COMMUNITY): Payer: Self-pay | Admitting: Surgery

## 2011-07-21 ENCOUNTER — Encounter (HOSPITAL_COMMUNITY): Payer: Self-pay | Admitting: *Deleted

## 2011-07-21 ENCOUNTER — Inpatient Hospital Stay (HOSPITAL_COMMUNITY): Payer: Medicare Other | Admitting: Vascular Surgery

## 2011-07-21 ENCOUNTER — Inpatient Hospital Stay (HOSPITAL_COMMUNITY)
Admission: RE | Admit: 2011-07-21 | Discharge: 2011-07-27 | DRG: 473 | Disposition: A | Discharge status: Skilled Nursing Facility | Payer: Medicare Other | Source: Ambulatory Visit | Attending: Neurosurgery | Admitting: Neurosurgery

## 2011-07-21 ENCOUNTER — Inpatient Hospital Stay (HOSPITAL_COMMUNITY): Payer: Medicare Other

## 2011-07-21 DIAGNOSIS — I252 Old myocardial infarction: Secondary | ICD-10-CM

## 2011-07-21 DIAGNOSIS — I2510 Atherosclerotic heart disease of native coronary artery without angina pectoris: Secondary | ICD-10-CM | POA: Diagnosis present

## 2011-07-21 DIAGNOSIS — E119 Type 2 diabetes mellitus without complications: Secondary | ICD-10-CM | POA: Diagnosis present

## 2011-07-21 DIAGNOSIS — M4712 Other spondylosis with myelopathy, cervical region: Principal | ICD-10-CM | POA: Diagnosis present

## 2011-07-21 DIAGNOSIS — K219 Gastro-esophageal reflux disease without esophagitis: Secondary | ICD-10-CM | POA: Diagnosis present

## 2011-07-21 DIAGNOSIS — Z79899 Other long term (current) drug therapy: Secondary | ICD-10-CM

## 2011-07-21 DIAGNOSIS — I10 Essential (primary) hypertension: Secondary | ICD-10-CM | POA: Diagnosis present

## 2011-07-21 DIAGNOSIS — Z7902 Long term (current) use of antithrombotics/antiplatelets: Secondary | ICD-10-CM

## 2011-07-21 DIAGNOSIS — Z01810 Encounter for preprocedural cardiovascular examination: Secondary | ICD-10-CM

## 2011-07-21 DIAGNOSIS — Z01812 Encounter for preprocedural laboratory examination: Secondary | ICD-10-CM

## 2011-07-21 DIAGNOSIS — Z01818 Encounter for other preprocedural examination: Secondary | ICD-10-CM

## 2011-07-21 DIAGNOSIS — Z951 Presence of aortocoronary bypass graft: Secondary | ICD-10-CM

## 2011-07-21 DIAGNOSIS — Z981 Arthrodesis status: Secondary | ICD-10-CM

## 2011-07-21 DIAGNOSIS — Z794 Long term (current) use of insulin: Secondary | ICD-10-CM

## 2011-07-21 HISTORY — PX: POSTERIOR CERVICAL FUSION/FORAMINOTOMY: SHX5038

## 2011-07-21 LAB — CBC
HCT: 35.3 % — ABNORMAL LOW (ref 39.0–52.0)
Hemoglobin: 12.5 g/dL — ABNORMAL LOW (ref 13.0–17.0)
Hemoglobin: 11.3 g/dL — ABNORMAL LOW (ref 13.0–17.0)
MCH: 32.8 pg (ref 26.0–34.0)
MCV: 95.7 fL (ref 78.0–100.0)
RBC: 3.69 MIL/uL — ABNORMAL LOW (ref 4.22–5.81)
RBC: 3.44 MIL/uL — ABNORMAL LOW (ref 4.22–5.81)
WBC: 6.7 10*3/uL (ref 4.0–10.5)

## 2011-07-21 LAB — BASIC METABOLIC PANEL
CO2: 25 mEq/L (ref 19–32)
Chloride: 105 mEq/L (ref 96–112)
Creatinine, Ser: 0.65 mg/dL (ref 0.50–1.35)
GFR calc Af Amer: >90 mL/min (ref >90)
Potassium: 3.9 mEq/L (ref 3.5–5.1)
Sodium: 140 mEq/L (ref 135–145)

## 2011-07-21 LAB — GLUCOSE, CAPILLARY
Glucose-Capillary: 163 mg/dL — ABNORMAL HIGH (ref 70–99)
Glucose-Capillary: 159 mg/dL — ABNORMAL HIGH (ref 70–99)
Glucose-Capillary: 200 mg/dL — ABNORMAL HIGH (ref 70–99)

## 2011-07-21 LAB — CREATININE, SERUM: Creatinine, Ser: 0.68 mg/dL (ref 0.50–1.35)

## 2011-07-21 SURGERY — POSTERIOR CERVICAL FUSION/FORAMINOTOMY LEVEL 5
Anesthesia: General | Site: Neck | Wound class: Clean

## 2011-07-21 MED ORDER — DIAZEPAM 5 MG PO TABS
5.0000 mg | ORAL_TABLET | Freq: Four times a day (QID) | ORAL | Status: DC | PRN
  Administered 2011-07-21 – 2011-07-27 (×8): 5 mg via ORAL
  Filled 2011-07-21 (×8): qty 1

## 2011-07-21 MED ORDER — LISINOPRIL 10 MG PO TABS
10.0000 mg | ORAL_TABLET | Freq: Every day | ORAL | Status: DC
  Administered 2011-07-21 – 2011-07-27 (×6): 10 mg via ORAL
  Filled 2011-07-21 (×7): qty 1

## 2011-07-21 MED ORDER — MENTHOL 3 MG MT LOZG: 1.0000 | LOZENGE | OROMUCOSAL | Status: DC | PRN

## 2011-07-21 MED ORDER — VECURONIUM BROMIDE 10 MG IV SOLR
INTRAVENOUS | Status: DC | PRN
  Administered 2011-07-21: 1 mg via INTRAVENOUS
  Administered 2011-07-21: 2 mg via INTRAVENOUS
  Administered 2011-07-21 (×2): 1 mg via INTRAVENOUS

## 2011-07-21 MED ORDER — HYDROMORPHONE HCL PF 1 MG/ML IJ SOLN
0.2500 mg | INTRAMUSCULAR | Status: DC | PRN
  Administered 2011-07-21 (×4): 0.5 mg via INTRAVENOUS

## 2011-07-21 MED ORDER — HYDROXYZINE HCL 50 MG PO TABS
50.0000 mg | ORAL_TABLET | Freq: Four times a day (QID) | ORAL | Status: DC | PRN
  Filled 2011-07-21: qty 1

## 2011-07-21 MED ORDER — OXYCODONE-ACETAMINOPHEN 5-325 MG PO TABS
1.0000 | ORAL_TABLET | ORAL | Status: DC | PRN
  Administered 2011-07-21 – 2011-07-22 (×3): 2 via ORAL
  Administered 2011-07-22: 1 via ORAL
  Administered 2011-07-23 – 2011-07-27 (×13): 2 via ORAL
  Filled 2011-07-21 (×17): qty 2

## 2011-07-21 MED ORDER — DROPERIDOL 2.5 MG/ML IJ SOLN
0.6250 mg | INTRAMUSCULAR | Status: DC | PRN
  Administered 2011-07-21: 0.625 mg via INTRAVENOUS

## 2011-07-21 MED ORDER — ROCURONIUM BROMIDE 100 MG/10ML IV SOLN
INTRAVENOUS | Status: DC | PRN
  Administered 2011-07-21: 50 mg via INTRAVENOUS
  Administered 2011-07-21: 20 mg via INTRAVENOUS
  Administered 2011-07-21: 10 mg via INTRAVENOUS
  Administered 2011-07-21: 20 mg via INTRAVENOUS

## 2011-07-21 MED ORDER — SIMVASTATIN 20 MG PO TABS
20.0000 mg | ORAL_TABLET | Freq: Every day | ORAL | Status: DC
  Administered 2011-07-21 – 2011-07-27 (×7): 20 mg via ORAL
  Filled 2011-07-21 (×7): qty 1

## 2011-07-21 MED ORDER — HETASTARCH-ELECTROLYTES 6 % IV SOLN
INTRAVENOUS | Status: DC | PRN
  Administered 2011-07-21: 13:00:00 via INTRAVENOUS

## 2011-07-21 MED ORDER — SODIUM CHLORIDE 0.9 % IV SOLN
INTRAVENOUS | Status: DC
  Administered 2011-07-21: 18:00:00 via INTRAVENOUS

## 2011-07-21 MED ORDER — PANTOPRAZOLE SODIUM 20 MG PO TBEC
20.0000 mg | DELAYED_RELEASE_TABLET | Freq: Every day | ORAL | Status: DC | PRN
  Filled 2011-07-21: qty 1

## 2011-07-21 MED ORDER — HYDROMORPHONE HCL PF 1 MG/ML IJ SOLN
INTRAMUSCULAR | Status: AC
  Filled 2011-07-21: qty 1

## 2011-07-21 MED ORDER — GLIPIZIDE ER 5 MG PO TB24
5.0000 mg | ORAL_TABLET | Freq: Every day | ORAL | Status: DC
  Administered 2011-07-21 – 2011-07-27 (×7): 5 mg via ORAL
  Filled 2011-07-21 (×8): qty 1

## 2011-07-21 MED ORDER — BUPIVACAINE-EPINEPHRINE 0.5% -1:200000 IJ SOLN
INTRAMUSCULAR | Status: DC | PRN
  Administered 2011-07-21: 26 mL

## 2011-07-21 MED ORDER — CEFAZOLIN SODIUM 1-5 GM-% IV SOLN
1.0000 g | Freq: Three times a day (TID) | INTRAVENOUS | Status: AC
  Administered 2011-07-21 – 2011-07-22 (×2): 1 g via INTRAVENOUS
  Filled 2011-07-21 (×2): qty 50

## 2011-07-21 MED ORDER — SODIUM CHLORIDE 0.9 % IJ SOLN: 3.0000 mL | INTRAMUSCULAR | Status: DC | PRN

## 2011-07-21 MED ORDER — METFORMIN HCL 500 MG PO TABS
1000.0000 mg | ORAL_TABLET | Freq: Two times a day (BID) | ORAL | Status: DC
  Administered 2011-07-21 – 2011-07-27 (×12): 1000 mg via ORAL
  Filled 2011-07-21 (×14): qty 2

## 2011-07-21 MED ORDER — PROPOFOL 10 MG/ML IV EMUL
INTRAVENOUS | Status: DC | PRN
  Administered 2011-07-21: 50 mg via INTRAVENOUS
  Administered 2011-07-21: 150 mg via INTRAVENOUS

## 2011-07-21 MED ORDER — TAMSULOSIN HCL 0.4 MG PO CAPS
0.4000 mg | ORAL_CAPSULE | Freq: Every day | ORAL | Status: DC
  Administered 2011-07-21 – 2011-07-26 (×6): 0.4 mg via ORAL
  Filled 2011-07-21 (×7): qty 1

## 2011-07-21 MED ORDER — CHLORHEXIDINE GLUCONATE CLOTH 2 % EX PADS
6.0000 | MEDICATED_PAD | Freq: Every day | CUTANEOUS | Status: AC
  Administered 2011-07-22 – 2011-07-26 (×5): 6 via TOPICAL

## 2011-07-21 MED ORDER — SUFENTANIL CITRATE 50 MCG/ML IV SOLN
INTRAVENOUS | Status: DC | PRN
  Administered 2011-07-21: 20 ug via INTRAVENOUS

## 2011-07-21 MED ORDER — SODIUM CHLORIDE 0.9 % IV SOLN: 250.0000 mL | INTRAVENOUS | Status: DC

## 2011-07-21 MED ORDER — 0.9 % SODIUM CHLORIDE (POUR BTL) OPTIME
TOPICAL | Status: DC | PRN
  Administered 2011-07-21: 1000 mL

## 2011-07-21 MED ORDER — METFORMIN HCL 500 MG PO TABS: 1000.0000 mg | ORAL_TABLET | Freq: Two times a day (BID) | ORAL | Status: DC

## 2011-07-21 MED ORDER — ACETAMINOPHEN 650 MG RE SUPP: 650.0000 mg | RECTAL | Status: DC | PRN

## 2011-07-21 MED ORDER — ONDANSETRON HCL 4 MG/2ML IJ SOLN
4.0000 mg | INTRAMUSCULAR | Status: DC | PRN
  Filled 2011-07-21: qty 2

## 2011-07-21 MED ORDER — MUPIROCIN 2 % EX OINT
1.0000 "application " | TOPICAL_OINTMENT | Freq: Two times a day (BID) | CUTANEOUS | Status: AC
  Administered 2011-07-21 – 2011-07-26 (×10): 1 via NASAL
  Filled 2011-07-21 (×3): qty 22

## 2011-07-21 MED ORDER — GLYCOPYRROLATE 0.2 MG/ML IJ SOLN
INTRAMUSCULAR | Status: DC | PRN
  Administered 2011-07-21: .5 mg via INTRAVENOUS

## 2011-07-21 MED ORDER — PHENOL 1.4 % MT LIQD: 1.0000 | OROMUCOSAL | Status: DC | PRN

## 2011-07-21 MED ORDER — ZOLPIDEM TARTRATE 5 MG PO TABS
5.0000 mg | ORAL_TABLET | Freq: Every evening | ORAL | Status: DC | PRN
  Administered 2011-07-22: 5 mg via ORAL
  Filled 2011-07-21: qty 1

## 2011-07-21 MED ORDER — SODIUM CHLORIDE 0.9 % IJ SOLN
3.0000 mL | Freq: Two times a day (BID) | INTRAMUSCULAR | Status: DC
  Administered 2011-07-21 – 2011-07-26 (×11): 3 mL via INTRAVENOUS

## 2011-07-21 MED ORDER — POVIDONE-IODINE 10 % EX OINT
TOPICAL_OINTMENT | CUTANEOUS | Status: DC | PRN
  Administered 2011-07-21: 1 via TOPICAL

## 2011-07-21 MED ORDER — LACTATED RINGERS IV SOLN
INTRAVENOUS | Status: DC | PRN
  Administered 2011-07-21 (×2): via INTRAVENOUS

## 2011-07-21 MED ORDER — MIDAZOLAM HCL 5 MG/5ML IJ SOLN
INTRAMUSCULAR | Status: DC | PRN
  Administered 2011-07-21: 1 mg via INTRAVENOUS

## 2011-07-21 MED ORDER — INSULIN DETEMIR 100 UNIT/ML ~~LOC~~ SOLN
15.0000 [IU] | Freq: Every day | SUBCUTANEOUS | Status: DC
  Administered 2011-07-21 – 2011-07-26 (×6): 15 [IU] via SUBCUTANEOUS
  Filled 2011-07-21: qty 3

## 2011-07-21 MED ORDER — HEMOSTATIC AGENTS (NO CHARGE) OPTIME
TOPICAL | Status: DC | PRN
  Administered 2011-07-21: 1 via TOPICAL

## 2011-07-21 MED ORDER — CARVEDILOL 12.5 MG PO TABS
12.5000 mg | ORAL_TABLET | Freq: Two times a day (BID) | ORAL | Status: DC
  Administered 2011-07-21 – 2011-07-27 (×12): 12.5 mg via ORAL
  Filled 2011-07-21 (×15): qty 1

## 2011-07-21 MED ORDER — EPHEDRINE SULFATE 50 MG/ML IJ SOLN
INTRAMUSCULAR | Status: DC | PRN
  Administered 2011-07-21 (×3): 10 mg via INTRAVENOUS

## 2011-07-21 MED ORDER — HEPARIN SODIUM (PORCINE) 5000 UNIT/ML IJ SOLN
5000.0000 [IU] | Freq: Three times a day (TID) | INTRAMUSCULAR | Status: DC
  Administered 2011-07-21 – 2011-07-27 (×17): 5000 [IU] via SUBCUTANEOUS
  Filled 2011-07-21 (×20): qty 1

## 2011-07-21 MED ORDER — PHENYLEPHRINE HCL 10 MG/ML IJ SOLN
INTRAMUSCULAR | Status: DC | PRN
  Administered 2011-07-21: 120 ug via INTRAVENOUS

## 2011-07-21 MED ORDER — NEOSTIGMINE METHYLSULFATE 1 MG/ML IJ SOLN
INTRAMUSCULAR | Status: DC | PRN
  Administered 2011-07-21: 3 mg via INTRAVENOUS

## 2011-07-21 MED ORDER — ACETAMINOPHEN 325 MG PO TABS: 650.0000 mg | ORAL_TABLET | ORAL | Status: DC | PRN

## 2011-07-21 MED ORDER — DROPERIDOL 2.5 MG/ML IJ SOLN
INTRAMUSCULAR | Status: AC
  Filled 2011-07-21: qty 2

## 2011-07-21 MED ORDER — THROMBIN 20000 UNITS EX KIT
PACK | CUTANEOUS | Status: DC | PRN
  Administered 2011-07-21: 20000 [IU] via TOPICAL

## 2011-07-21 MED ORDER — MORPHINE SULFATE 4 MG/ML IJ SOLN
1.0000 mg | INTRAMUSCULAR | Status: DC | PRN
  Administered 2011-07-21: 4 mg via INTRAVENOUS
  Administered 2011-07-21 (×2): 2 mg via INTRAVENOUS
  Administered 2011-07-22 – 2011-07-27 (×14): 4 mg via INTRAVENOUS
  Filled 2011-07-21 (×19): qty 1

## 2011-07-21 MED ORDER — PHENYLEPHRINE HCL 10 MG/ML IJ SOLN
10.0000 mg | INTRAVENOUS | Status: DC | PRN
  Administered 2011-07-21: 40 ug/min via INTRAVENOUS

## 2011-07-21 SURGICAL SUPPLY — 83 items
APL SKNCLS STERI-STRIP NONHPOA (GAUZE/BANDAGES/DRESSINGS) ×1
BENZOIN TINCTURE PRP APPL 2/3 (GAUZE/BANDAGES/DRESSINGS) ×2 IMPLANT
BIT DRILL ANGLD TIP SCRW 3.5 (BIT) ×1 IMPLANT
BLADE SURG 11 STRL SS (BLADE) ×1 IMPLANT
BLADE SURG ROTATE 9660 (MISCELLANEOUS) ×2 IMPLANT
BLADE ULTRA TIP 2M (BLADE) ×2 IMPLANT
BRUSH SCRUB EZ 1% IODOPHOR (MISCELLANEOUS) IMPLANT
BRUSH SCRUB EZ PLAIN DRY (MISCELLANEOUS) ×2 IMPLANT
BUR ACORN 6.0 (BURR) ×1 IMPLANT
BUR MATCHSTICK NEURO 3.0 LAGG (BURR) ×2 IMPLANT
CANISTER SUCTION 2500CC (MISCELLANEOUS) ×2 IMPLANT
CAP ELLIPSE LOCKING (Cap) ×11 IMPLANT
CLOTH BEACON ORANGE TIMEOUT ST (SAFETY) ×2 IMPLANT
CONT SPEC 4OZ CLIKSEAL STRL BL (MISCELLANEOUS) ×2 IMPLANT
COVER MAYO STAND STRL (DRAPES) ×1 IMPLANT
DECANTER SPIKE VIAL GLASS SM (MISCELLANEOUS) ×2 IMPLANT
DRAPE LAPAROTOMY 100X72 PEDS (DRAPES) ×2 IMPLANT
DRAPE MICROSCOPE LEICA (MISCELLANEOUS) IMPLANT
DRAPE ORTHO SPLIT 77X108 STRL (DRAPES) ×2
DRAPE POUCH INSTRU U-SHP 10X18 (DRAPES) ×2 IMPLANT
DRAPE SURG ORHT 6 SPLT 77X108 (DRAPES) ×1 IMPLANT
DRSG PAD ABDOMINAL 8X10 ST (GAUZE/BANDAGES/DRESSINGS) ×1 IMPLANT
DURAPREP 6ML APPLICATOR 50/CS (WOUND CARE) ×2 IMPLANT
ELECT BLADE 4.0 EZ CLEAN MEGAD (MISCELLANEOUS) ×2
ELECT REM PT RETURN 9FT ADLT (ELECTROSURGICAL) ×2
ELECTRODE BLDE 4.0 EZ CLN MEGD (MISCELLANEOUS) IMPLANT
ELECTRODE REM PT RTRN 9FT ADLT (ELECTROSURGICAL) ×1 IMPLANT
EVACUATOR 3/16  PVC DRAIN (DRAIN) ×1
EVACUATOR 3/16 PVC DRAIN (DRAIN) IMPLANT
GAUZE SPONGE 4X4 12PLY STRL LF (GAUZE/BANDAGES/DRESSINGS) ×1 IMPLANT
GAUZE SPONGE 4X4 16PLY XRAY LF (GAUZE/BANDAGES/DRESSINGS) IMPLANT
GLOVE BIOGEL M 8.0 STRL (GLOVE) ×3 IMPLANT
GLOVE BIOGEL PI IND STRL 7.5 (GLOVE) IMPLANT
GLOVE BIOGEL PI IND STRL 8 (GLOVE) IMPLANT
GLOVE BIOGEL PI INDICATOR 7.5 (GLOVE) ×2
GLOVE BIOGEL PI INDICATOR 8 (GLOVE) ×3
GLOVE ECLIPSE 6.5 STRL STRAW (GLOVE) ×1 IMPLANT
GLOVE ECLIPSE 7.5 STRL STRAW (GLOVE) ×3 IMPLANT
GLOVE EXAM NITRILE LRG STRL (GLOVE) IMPLANT
GLOVE EXAM NITRILE MD LF STRL (GLOVE) ×1 IMPLANT
GLOVE EXAM NITRILE XL STR (GLOVE) IMPLANT
GLOVE EXAM NITRILE XS STR PU (GLOVE) IMPLANT
GLOVE INDICATOR 7.5 STRL GRN (GLOVE) ×2 IMPLANT
GLOVE INDICATOR 8.0 STRL GRN (GLOVE) ×2 IMPLANT
GOWN BRE IMP SLV AUR LG STRL (GOWN DISPOSABLE) ×4 IMPLANT
GOWN BRE IMP SLV AUR XL STRL (GOWN DISPOSABLE) ×1 IMPLANT
GOWN STRL REIN 2XL LVL4 (GOWN DISPOSABLE) ×3 IMPLANT
KIT BASIN OR (CUSTOM PROCEDURE TRAY) ×2 IMPLANT
KIT ROOM TURNOVER OR (KITS) ×2 IMPLANT
MILL MEDIUM DISP (BLADE) ×1 IMPLANT
NDL HYPO 18GX1.5 BLUNT FILL (NEEDLE) IMPLANT
NDL HYPO 25X1 1.5 SAFETY (NEEDLE) IMPLANT
NEEDLE HYPO 18GX1.5 BLUNT FILL (NEEDLE) IMPLANT
NEEDLE HYPO 25X1 1.5 SAFETY (NEEDLE) ×2 IMPLANT
NS IRRIG 1000ML POUR BTL (IV SOLUTION) ×2 IMPLANT
PACK FOAM VITOSS 10CC (Orthopedic Implant) ×2 IMPLANT
PACK LAMINECTOMY NEURO (CUSTOM PROCEDURE TRAY) ×2 IMPLANT
PAD ARMBOARD 7.5X6 YLW CONV (MISCELLANEOUS) ×6 IMPLANT
PATTIES SURGICAL .25X.25 (GAUZE/BANDAGES/DRESSINGS) IMPLANT
PIN MAYFIELD SKULL DISP (PIN) ×3 IMPLANT
ROD ELLIPSE 120MM (Rod) ×2 IMPLANT
RUBBERBAND STERILE (MISCELLANEOUS) IMPLANT
SCREW 3.5X12MM (Screw) ×16 IMPLANT
SCREW POLYAXIAL ELLIPSE 4X12 (Screw) ×2 IMPLANT
SPONGE GAUZE 4X4 12PLY (GAUZE/BANDAGES/DRESSINGS) IMPLANT
SPONGE LAP 4X18 X RAY DECT (DISPOSABLE) IMPLANT
STAPLER SKIN PROX WIDE 3.9 (STAPLE) IMPLANT
STRIP CLOSURE SKIN 1/2X4 (GAUZE/BANDAGES/DRESSINGS) ×1 IMPLANT
SUT ETHILON 3 0 FSL (SUTURE) IMPLANT
SUT ETHILON 4 0 PS 2 18 (SUTURE) IMPLANT
SUT VIC AB 0 CT1 18XCR BRD 8 (SUTURE) IMPLANT
SUT VIC AB 0 CT1 8-18 (SUTURE) ×6
SUT VIC AB 2-0 CP2 18 (SUTURE) ×4 IMPLANT
SUT VIC AB 3-0 SH 8-18 (SUTURE) ×2 IMPLANT
SYR 20ML ECCENTRIC (SYRINGE) ×2 IMPLANT
SYR 3ML LL SCALE MARK (SYRINGE) IMPLANT
TOWEL OR 17X24 6PK STRL BLUE (TOWEL DISPOSABLE) ×2 IMPLANT
TOWEL OR 17X26 10 PK STRL BLUE (TOWEL DISPOSABLE) ×2 IMPLANT
TRAP SPECIMEN MUCOUS 40CC (MISCELLANEOUS) ×1 IMPLANT
UNDERPAD 30X30 INCONTINENT (UNDERPADS AND DIAPERS) IMPLANT
WATER STERILE IRR 1000ML POUR (IV SOLUTION) ×2 IMPLANT
ellipse occipito cervico throacic IMPLANT
ellipseoccip-cervico-throa ×1 IMPLANT

## 2011-07-21 NOTE — H&P (Signed)
Roy Hickman is an 71 y.o. male.   Chief Complaint: difficult with balance. HPI: had anterior cervical decompression  in 2011.was admited to [place] by the [facility] service because imbalance. Mri l spine showed ddd. Had a cervical myelogram as outpatient which showed severe stenosis from c3 to c7 t1. He will be taken today for posterior decompression and fusion.   Past Medical History  Diagnosis Date  . Hypertension   . Diabetes mellitus   . MI (mitral incompetence)   . Myocardial infarction 2007  . Coronary artery disease   . GERD (gastroesophageal reflux disease)     Past Surgical History  Procedure Date  . Elbow surgery     left  . Laminectomy   . Cervical spine surgery   . Coronary artery bypass graft   . Back surgery     Family History  Problem Relation Age of Onset  . Heart failure Father   . Heart failure Sister    Social History:  reports that he quit smoking about 24 years ago. His smoking use included Cigarettes. He quit after 25 years of use. He has never used smokeless tobacco. He reports that he does not drink alcohol or use illicit drugs.  Allergies: No Known Allergies  Medications Prior to Admission  Medication Dose Route Frequency Provider Last Rate Last Dose  . ceFAZolin (ANCEF) IVPB 2 g/50 mL premix  2 g Intravenous 60 min Pre-Op Benny Lennert, PHARMD       Medications Prior to Admission  Medication Sig Dispense Refill  . ALPRAZolam (XANAX) 1 MG tablet Take 1 mg by mouth 2 (two) times daily as needed. For anxiety.      Marland Kitchen atorvastatin (LIPITOR) 40 MG tablet Take 40 mg by mouth at bedtime.       . carvedilol (COREG) 12.5 MG tablet Take 12.5 mg by mouth 2 (two) times daily with a meal.       . clopidogrel (PLAVIX) 75 MG tablet Take 75 mg by mouth daily with breakfast.      . cyclobenzaprine (FLEXERIL) 10 MG tablet Take 10 mg by mouth 3 (three) times daily as needed. For muscle spasm.      . diphenhydrAMINE (BENADRYL) 50 MG capsule Take 50 mg by  mouth every 4 (four) hours as needed. For severe itching      . folic acid ([name]) 1 MG tablet Take 1 mg by mouth daily.       Marland Kitchen glipiZIDE ([name]) 5 MG 24 hr tablet Take 5 mg by mouth daily.       . hydrOXYzine ([name]) 50 MG tablet Take 50 mg by mouth every 6 (six) hours as needed. For itching.      Marland Kitchen ibuprofen (ADVIL,MOTRIN) 200 MG tablet Take 200 mg by mouth 2 (two) times daily. Scheduled        . insulin detemir ([name]) 100 UNIT/ML injection Inject 15 Units into the skin at bedtime.      . lidocaine ([name]) 5 % Place 1 patch onto the skin See admin instructions. Apply to affected area on 12 hours and off 12 hours. Apply patch at 0800 and remove at 2000 pm.      . lisinopril ([name],[name]) 10 MG tablet Take 10 mg by mouth daily.       Marland Kitchen loratadine (CLARITIN) 10 MG tablet Take 10 mg by mouth daily.      . metFORMIN ([name]) 1000 MG tablet Take 1,000 mg by mouth 2 (two) times daily  with a meal.      . niacin ([name]) 1000 MG CR tablet Take 1,000 mg by mouth 2 (two) times daily.      . niacin 100 MG tablet Take 100 mg by mouth 2 (two) times daily.        . Omega-3 Fatty Acids (FISH OIL) 500 MG CAPS Take 500 mg by mouth 3 (three) times daily.      Marland Kitchen oxyCODONE-acetaminophen (PERCOCET) 7.5-325 MG per tablet Take 1 tablet by mouth every 8 (eight) hours as needed. For pain.      . pantoprazole (PROTONIX) 20 MG tablet Take 20 mg by mouth daily as needed. For indigestion      . ranitidine ([name]) 300 MG tablet Take 300 mg by mouth 2 (two) times daily.       . Tamsulosin HCl (FLOMAX) 0.4 MG CAPS Take 0.4 mg by mouth at bedtime.         Results for orders placed during the hospital encounter of 07/21/11 (from the past 48 hour(s))  GLUCOSE, CAPILLARY     Status: Abnormal   Collection Time   07/21/11  8:10 AM      Component Value Range Comment   Glucose-Capillary 163 (*) 70 - 99 (mg/dL)   BASIC METABOLIC PANEL     Status: Abnormal   Collection Time   07/21/11  8:52 AM        Component Value Range Comment   Sodium 140  135 - 145 (mEq/L)    Potassium 3.9  3.5 - 5.1 (mEq/L)    Chloride 105  96 - 112 (mEq/L)    CO2 25  19 - 32 (mEq/L)    Glucose, Bld 178 (*) 70 - 99 (mg/dL)    BUN 14  6 - 23 (mg/dL)    Creatinine, Ser 1.61  0.50 - 1.35 (mg/dL)    Calcium 9.2  8.4 - 10.5 (mg/dL)    GFR calc non Af Amer >90  >90 (mL/min)    GFR calc Af Amer >90  >90 (mL/min)   CBC     Status: Abnormal   Collection Time   07/21/11  8:52 AM      Component Value Range Comment   WBC 6.7  4.0 - 10.5 (K/uL)    RBC 3.69 (*) 4.22 - 5.81 (MIL/uL)    Hemoglobin 12.5 (*) 13.0 - 17.0 (g/dL)    HCT 09.6 (*) 04.5 - 52.0 (%)    MCV 95.7  78.0 - 100.0 (fL)    MCH 33.9  26.0 - 34.0 (pg)    MCHC 35.4  30.0 - 36.0 (g/dL)    RDW 40.9  81.1 - 91.4 (%)    Platelets 170  150 - 400 (K/uL)   GLUCOSE, CAPILLARY     Status: Abnormal   Collection Time   07/21/11 10:10 AM      Component Value Range Comment   Glucose-Capillary 156 (*) 70 - 99 (mg/dL)    No results found.  Review of Systems  Constitutional: Negative.   HENT: Negative.   Eyes: Negative.   Respiratory: Negative.   Cardiovascular: Negative.   Gastrointestinal: Negative.   Genitourinary: Positive for urgency.  Skin: Negative.   Neurological: Positive for tingling and focal weakness.  Endo/Heme/Allergies: Negative.   Psychiatric/Behavioral: Positive for memory loss.    Blood pressure 134/78, pulse 84, temperature 98.3 F (36.8 C), temperature source Oral, resp. rate 18, height 6' (1.829 m), weight 79.379 kg (175 lb), SpO2 98.00%. Physical Exampatient who came to  my office in a wheelchair.hent,nl,. Neck,anterior scar. Lungs,clear.cv,nl.abdomen,soft. Extremities,nl NEURO hyperreflexia in all 4 limbs. Weakness of biceps and interosseous muscles. Can not walk in a straight line. sensory,hypersthesia in lower and upper limbs. Myelogram severe stenosis c3 to t1.   Assessment/Plan Posterior decompression from c3 to t1 with lateral  masses screws. Fluoroscopy. Risks paralysis, infection,worsening, non fusion and all the risks associted with dm.  Hansini Clodfelter M 07/21/2011, 10:27 AM

## 2011-07-21 NOTE — Progress Notes (Signed)
Cardiac clearance received from Dr. Cassandria Santee office.  OR noted that awaiting cardiac offices' fax.//L. Donnabelle Blanchard,RN

## 2011-07-21 NOTE — Transfer of Care (Signed)
Immediate Anesthesia Transfer of Care Note  Patient: Roy Hickman  Procedure(s) Performed:  POSTERIOR CERVICAL FUSION/FORAMINOTOMY LEVEL 5 - Cervical three to Thoracic one Laminectomy, posterolateral arthrodesis, with Lateral Mass screws  Patient Location: PACU  Anesthesia Type: General  Level of Consciousness: awake and alert   Airway & Oxygen Therapy: Patient Spontanous Breathing and Patient connected to nasal cannula oxygen  Post-op Assessment: Report given to PACU RN, Post -op Vital signs reviewed and stable and Patient moving all extremities  Post vital signs: Reviewed and stable Filed Vitals:   07/21/11 0808  BP: 134/78  Pulse: 84  Temp: 36.8 C  Resp: 18    Complications: No apparent anesthesia complications

## 2011-07-21 NOTE — Progress Notes (Signed)
Copies of cardiac test, recent EKG's and office visit notes received from Dr. Benay Spice office.  Roy Hickman, given copies & ok'd for pt to proceed to OR.//L. Deavon Podgorski,RN

## 2011-07-21 NOTE — Progress Notes (Signed)
[facility], Dr. Benay Spice office, notified that we need copies of ECHO, office visit notes, and recent EKG's.  Baxter Hire from their Medical Records stated she would fax any info.  Dr. Cassandria Santee office notified of need for cardiac clearance verification. Medical records at office stated they would fax.  Awaiting all faxes to give to Annice Needy, anesthiosiology PA.

## 2011-07-21 NOTE — Anesthesia Preprocedure Evaluation (Signed)
Anesthesia Evaluation  Patient identified by MRN, date of birth, ID band Patient awake    Reviewed: Allergy & Precautions, H&P , NPO status , Patient's Chart, lab work & pertinent test results  History of Anesthesia Complications Negative for: history of anesthetic complications  Airway       Dental   Pulmonary neg pulmonary ROS, former smoker clear to auscultation  Pulmonary exam normal       Cardiovascular hypertension, Pt. on medications + CAD, + Past MI and + CABG Regular Normal    Neuro/Psych Negative Psych ROS   GI/Hepatic Neg liver ROS, GERD-  Medicated and Controlled,  Endo/Other  Diabetes mellitus-, Oral Hypoglycemic Agents  Renal/GU negative Renal ROS  Genitourinary negative   Musculoskeletal   Abdominal   Peds  Hematology   Anesthesia Other Findings   Reproductive/Obstetrics                           Anesthesia Physical Anesthesia Plan  ASA: III  Anesthesia Plan: General   Post-op Pain Management:    Induction: Intravenous  Airway Management Planned: Oral ETT  Additional Equipment:   Intra-op Plan:   Post-operative Plan: Extubation in OR  Informed Consent: I have reviewed the patients History and Physical, chart, labs and discussed the procedure including the risks, benefits and alternatives for the proposed anesthesia with the patient or authorized representative who has indicated his/her understanding and acceptance.     Plan Discussed with: CRNA, Anesthesiologist and Surgeon  Anesthesia Plan Comments:         Anesthesia Quick Evaluation

## 2011-07-21 NOTE — Anesthesia Postprocedure Evaluation (Signed)
Anesthesia Post Note  Patient: Roy Hickman  Procedure(s) Performed:  POSTERIOR CERVICAL FUSION/FORAMINOTOMY LEVEL 5 - Cervical three to Thoracic one Laminectomy, posterolateral arthrodesis, with Lateral Mass screws  Anesthesia type: general  Patient location: PACU  Post pain: Pain level controlled  Post assessment: Patient's Cardiovascular Status Stable  Last Vitals:  Filed Vitals:   07/21/11 1600  BP:   Pulse: 101  Temp:   Resp: 21    Post vital signs: Reviewed and stable  Level of consciousness: sedated  Complications: No apparent anesthesia complications

## 2011-07-21 NOTE — Progress Notes (Signed)
Notified Dr. Krista Blue that Type & Screen not obtained during lab draw.  He stated that pt would not need at this time, so, pt does not have to be re-stuck.//L. Vallarie Fei,RN

## 2011-07-21 NOTE — Progress Notes (Signed)
Pt. Much calmer and cooperative at this time, pt. Stating neck is still hurting but better.

## 2011-07-21 NOTE — Progress Notes (Signed)
Pt. Hollering , stating neck hurts, pt. Given pain meds , still kicking and grabbing at staff

## 2011-07-21 NOTE — Progress Notes (Signed)
Patient ID: Roy Hickman, male   DOB: [birth year], 71 y.o.   MRN: [id number] Posterior cervical decompression and fusion. Op note [id number]

## 2011-07-22 ENCOUNTER — Encounter (HOSPITAL_COMMUNITY): Payer: Self-pay | Admitting: Neurosurgery

## 2011-07-22 DIAGNOSIS — M4712 Other spondylosis with myelopathy, cervical region: Secondary | ICD-10-CM

## 2011-07-22 LAB — GLUCOSE, CAPILLARY: Glucose-Capillary: 171 mg/dL — ABNORMAL HIGH (ref 70–99)

## 2011-07-22 NOTE — Progress Notes (Signed)
Physical Therapy Evaluation Patient Details Name: Roy Hickman MRN: [id number] DOB: Mar 11, 1941 Today's Date: 07/22/2011  Problem List:  Patient Active Problem List  Diagnoses  . Difficulty walking  . CAD (coronary artery disease)  . DM2 (diabetes mellitus, type 2)  . HTN (hypertension)  . Low back pain    Past Medical History:  Past Medical History  Diagnosis Date  . Hypertension   . Diabetes mellitus   . MI (mitral incompetence)   . Myocardial infarction 2007  . Coronary artery disease   . GERD (gastroesophageal reflux disease)    Past Surgical History:  Past Surgical History  Procedure Date  . Elbow surgery     left  . Laminectomy   . Cervical spine surgery   . Coronary artery bypass graft   . Back surgery     PT Assessment/Plan/Recommendation PT Assessment Clinical Impression Statement: Patient presents S/P C3-T1 laminectomies and PLA secondary to severe stenosis. Per reports patient was having balance issues at home with frequent falls. Although patient would benefit from SNF he is unlikely to agree to go secondary to prior experience. At this time patient will require intensive PT to enable him to return home alone. PT Recommendation/Assessment: Patient will need skilled PT in the acute care venue PT Problem List: Decreased strength;Decreased activity tolerance;Decreased balance;Decreased mobility;Decreased knowledge of precautions;Decreased knowledge of use of DME;Decreased cognition;Pain Barriers to Discharge: Decreased caregiver support PT Therapy Diagnosis : Difficulty walking;Acute pain PT Plan PT Frequency: Min 5X/week PT Treatment/Interventions: DME instruction;Gait training;Functional mobility training;Therapeutic activities;Therapeutic exercise;Balance training;Patient/family education;Cognitive remediation PT Recommendation Recommendations for Other Services: Rehab consult Follow Up Recommendations: Inpatient Rehab Equipment Recommended: Defer to next  venue PT Goals  Acute Rehab PT Goals PT Goal Formulation: With patient Time For Goal Achievement: 2 weeks Pt will Roll Supine to Right Side: with modified independence PT Goal: Rolling Supine to Right Side - Progress: Goal set today Pt will Roll Supine to Left Side: with modified independence PT Goal: Rolling Supine to Left Side - Progress: Goal set today Pt will go Supine/Side to Sit: with modified independence;with HOB 0 degrees PT Goal: Supine/Side to Sit - Progress: Goal set today Pt will go Sit to Supine/Side: with modified independence;with HOB 0 degrees PT Goal: Sit to Supine/Side - Progress: Goal set today Pt will go Sit to Stand: with supervision;with upper extremity assist PT Goal: Sit to Stand - Progress: Goal set today Pt will go Stand to Sit: with supervision;with upper extremity assist PT Goal: Stand to Sit - Progress: Goal set today Pt will Transfer Bed to Chair/Chair to Bed: with supervision PT Transfer Goal: Bed to Chair/Chair to Bed - Progress: Goal set today Pt will Stand: with supervision;with bilateral upper extremity support;1 - 2 min PT Goal: Stand - Progress: Goal set today Pt will Ambulate: >150 feet;with supervision PT Goal: Ambulate - Progress: Goal set today  PT Evaluation Precautions/Restrictions  Precautions Precautions: Fall Precaution Comments: Cervical Required Braces or Orthoses: Yes Cervical Brace: Hard collar;Applied in sitting position Restrictions Weight Bearing Restrictions: Yes Other Position/Activity Restrictions: limit UE use with cervical precautions Prior Functioning  Home Living Lives With: Alone Receives Help From: Friend(s);Family Type of Home: Apartment Home Layout: One level Home Access: Stairs to enter [employer] of Steps: 2 Bathroom Shower/Tub: Forensic scientist: Standard Home Adaptive Equipment: Shower chair with back;Walker - rolling Additional Comments: pt has teacup poodle "sugar" 14  yos. 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 Prior  Function Level of Independence: Independent with basic ADLs;Independent with homemaking with ambulation Able to Take Stairs?: Yes Driving: No Vocation: Retired Comments: friends Agricultural consultant Arousal/Alertness: Awake/alert Overall Cognitive Status: Impaired (Poor judgement regarding D/C home based on current function) Orientation Level: Oriented X4 Sensation/Coordination Sensation Light Touch: Impaired Detail Light Touch Impaired Details: Impaired LUE;Impaired RUE Additional Comments: Bilateral hands - decreased sensation to light touch Coordination Gross Motor Movements are Fluid and Coordinated: Yes Extremity Assessment RLE Assessment RLE Assessment: Exceptions to [facility] RLE AROM (degrees) Overall AROM Right Lower Extremity: Within functional limits for tasks assessed RLE Strength RLE Overall Strength: Deficits RLE Overall Strength Comments: Decreased hip flexor strength 3/5 and hip extensor strength 3/5. Grossly 4/5 all others LLE Assessment LLE Assessment: Within Functional Limits Mobility (including Balance) Transfers Transfers: Yes Sit to Stand: 2: Max assist;With upper extremity assist;From chair/3-in-1 Sit to Stand Details (indicate cue type and reason): Patient required verbal cues for safe scooting to edge of chair for stand initiation as tendency to pull on rolling walker rather than use arm rests. Verbal cues for correct hand placement to and from rollling walker and education not to excessively push with upper extremities secondary to surgery. Patient with poor sequencing of stand leading to posterior lean secondary to premature hip extension and latent knee extension. Stand to Sit: To chair/3-in-1;With upper extremity assist;3: Mod assist Stand to Sit Details: Poor control of descent without assistance  Posture/Postural Control Posture/Postural Control:  Postural limitations Postural Limitations: Trunk and knees flexed with weight posteriorly. Patient needs max cues to correct posture. Balance Balance Assessed: Yes Static Standing Balance Static Standing - Balance Support: Bilateral upper extremity supported Static Standing - Level of Assistance: 3: Mod assist End of Session PT - End of Session Equipment Utilized During Treatment: Gait belt Activity Tolerance: Patient limited by fatigue (and weakness resulting in inability to ambulate) Patient left: in chair;with call bell in reach Nurse Communication: Mobility status for transfers General Behavior During Session: [facility] for tasks performed Edwyna Perfect, PT  Pager [phone]  07/22/2011, 11:15 AM

## 2011-07-22 NOTE — Consult Note (Signed)
Physical Medicine and Rehabilitation Consult Reason for Consult: C3-T1 laminectomies Referring Phsyician: Dr. Jamesetta So is an 71 y.o. male.   HPI: 53 right-handed white male with history of anterior cervical decompression and 2011. Admitted January 16 with decrease in balance and upper extremity weakness.The patient has had multiple falls prior to admission due to poor balance. Cervical myelogram showed severe stenosis cervical 3-7 T-1. Underwent posterior cervical fusion foraminotomies cervical 3 to thoracic 1 laminectomy posterior lateral arthrodesis with lateral mass screws on January 16 per Dr. [name]. Fitted with cervical collar. Initial bouts of confusion but continues to improve. Pain control with Dilaudid. Physical therapy evaluation completed January 17. Required Max assist for sit to stand and moderate assist stand to sit. Ambulation was not tested. Occupational therapy evaluation pending. Physical medicine and rehabilitation was consulted at the request of physical therapy to consider inpatient rehabilitation services.  Review of Systems  Constitutional: Positive for malaise/fatigue.  HENT: Positive for neck pain.   Eyes: Negative for double vision.  Respiratory: Negative for cough and shortness of breath.   Cardiovascular: Negative for chest pain.  Gastrointestinal: Positive for constipation. Negative for nausea.  Genitourinary: Negative.   Musculoskeletal: Positive for myalgias.  Skin: Negative.   Neurological: Negative for dizziness and headaches.  Psychiatric/Behavioral: Negative.    Past Medical History  Diagnosis Date  . Hypertension   . Diabetes mellitus   . MI (mitral incompetence)   . Myocardial infarction 2007  . Coronary artery disease   . GERD (gastroesophageal reflux disease)    Past Surgical History  Procedure Date  . Elbow surgery     left  . Laminectomy   . Cervical spine surgery   . Coronary artery bypass graft   . Back surgery    Family  History  Problem Relation Age of Onset  . Heart failure Father   . Heart failure Sister    Social History:  reports that he quit smoking about 24 years ago. His smoking use included Cigarettes. He quit after 25 years of use. He has never used smokeless tobacco. He reports that he does not drink alcohol or use illicit drugs. Allergies: No Known Allergies Medications Prior to Admission  Medication Dose Route Frequency Provider Last Rate Last Dose  . 0.9 %  sodium chloride infusion  250 mL Intravenous Continuous Karn Cassis, MD      . 0.9 %  sodium chloride infusion   Intravenous Continuous Karn Cassis, MD      . acetaminophen (TYLENOL) tablet 650 mg  650 mg Oral Q4H PRN Karn Cassis, MD       Or  . acetaminophen (TYLENOL) suppository 650 mg  650 mg Rectal Q4H PRN Karn Cassis, MD      . carvedilol (COREG) tablet 12.5 mg  12.5 mg Oral BID WC Karn Cassis, MD   12.5 mg at 07/22/11 3329  . ceFAZolin (ANCEF) IVPB 1 g/50 mL premix  1 g Intravenous Q8H Karn Cassis, MD   1 g at 07/22/11 0548  . ceFAZolin (ANCEF) IVPB 2 g/50 mL premix  2 g Intravenous 60 min Pre-Op Benny Lennert, PHARMD   2 g at 07/21/11 1215  . Chlorhexidine Gluconate Cloth 2 % PADS 6 each  6 each Topical Q0600 Karn Cassis, MD   6 each at 07/22/11 0600  . diazepam (VALIUM) tablet 5 mg  5 mg Oral Q6H PRN Karn Cassis, MD   5 mg at 07/21/11 2341  .  droperidol (INAPSINE) 2.5 MG/ML injection           . glipiZIDE ([name]) 24 hr tablet 5 mg  5 mg Oral Daily Karn Cassis, MD   5 mg at 07/22/11 1045  . heparin injection 5,000 Units  5,000 Units Subcutaneous Q8H Karn Cassis, MD   5,000 Units at 07/22/11 0600  . HYDROmorphone (DILAUDID) 1 MG/ML injection           . HYDROmorphone (DILAUDID) 1 MG/ML injection           . hydrOXYzine ([name]) tablet 50 mg  50 mg Oral Q6H PRN Karn Cassis, MD      . insulin detemir ([name]) injection 15 Units  15 Units Subcutaneous QHS Karn Cassis, MD   15 Units at 07/21/11 2159  . lisinopril ([name],[name]) tablet 10 mg  10 mg Oral Daily Karn Cassis, MD   10 mg at 07/22/11 1045  . menthol-cetylpyridinium (CEPACOL) lozenge 3 mg  1 lozenge Oral PRN Karn Cassis, MD       Or  . phenol (CHLORASEPTIC) mouth spray 1 spray  1 spray Mouth/Throat PRN Karn Cassis, MD      . metFORMIN ([name]) tablet 1,000 mg  1,000 mg Oral BID WC Karn Cassis, MD   1,000 mg at 07/22/11 1610  . morphine 4 MG/ML injection 1-4 mg  1-4 mg Intravenous Q3H PRN Karn Cassis, MD   4 mg at 07/22/11 0751  . mupirocin ointment (BACTROBAN) 2 % 1 application  1 application Nasal BID Karn Cassis, MD   1 application at 07/22/11 1045  . ondansetron (ZOFRAN) injection 4 mg  4 mg Intravenous Q4H PRN Karn Cassis, MD      . oxyCODONE-acetaminophen (PERCOCET) 5-325 MG per tablet 1-2 tablet  1-2 tablet Oral Q4H PRN Karn Cassis, MD   2 tablet at 07/22/11 0947  . pantoprazole (PROTONIX) EC tablet 20 mg  20 mg Oral Daily PRN Karn Cassis, MD      . simvastatin (ZOCOR) tablet 20 mg  20 mg Oral Daily Karn Cassis, MD   20 mg at 07/22/11 1045  . sodium chloride 0.9 % injection 3 mL  3 mL Intravenous Q12H Karn Cassis, MD   3 mL at 07/22/11 1000  . sodium chloride 0.9 % injection 3 mL  3 mL Intravenous PRN Karn Cassis, MD      . Tamsulosin HCl [facility]) capsule 0.4 mg  0.4 mg Oral QHS Karn Cassis, MD   0.4 mg at 07/21/11 2200  . zolpidem (AMBIEN) tablet 5 mg  5 mg Oral QHS PRN Karn Cassis, MD   5 mg at 07/22/11 0226  . DISCONTD: 0.9 % irrigation (POUR BTL)    PRN Karn Cassis, MD   1,000 mL at 07/21/11 1215  . DISCONTD: bupivacaine-EPINEPHrine (MARCAINE W/ EPI) 0.5 % (with pres) injection    PRN Karn Cassis, MD   26 mL at 07/21/11 1251  . DISCONTD: droperidol (INAPSINE) injection 0.625 mg  0.625 mg Intravenous PRN Remonia Richter, MD   0.625 mg at 07/21/11 1559  . DISCONTD: hemostatic agents    PRN  Karn Cassis, MD   1 application at 07/21/11 1215  . DISCONTD: HYDROmorphone (DILAUDID) injection 0.25-0.5 mg  0.25-0.5 mg Intravenous Q5 min PRN Remonia Richter, MD   0.5 mg at 07/21/11 1639  . DISCONTD: metFORMIN ([name]) tablet 1,000 mg  1,000  mg Oral BID WC Karn Cassis, MD      . DISCONTD: povidone-iodine (BETADINE) 10 % ointment    PRN Karn Cassis, MD   1 application at 07/21/11 1240  . DISCONTD: thrombin spray    PRN Karn Cassis, MD   20,000 Units at 07/21/11 1220   Medications Prior to Admission  Medication Sig Dispense Refill  . ALPRAZolam (XANAX) 1 MG tablet Take 1 mg by mouth 2 (two) times daily as needed. For anxiety.      Marland Kitchen atorvastatin (LIPITOR) 40 MG tablet Take 40 mg by mouth at bedtime.       . carvedilol (COREG) 12.5 MG tablet Take 12.5 mg by mouth 2 (two) times daily with a meal.       . clopidogrel (PLAVIX) 75 MG tablet Take 75 mg by mouth daily with breakfast.      . cyclobenzaprine (FLEXERIL) 10 MG tablet Take 10 mg by mouth 3 (three) times daily as needed. For muscle spasm.      . diphenhydrAMINE (BENADRYL) 50 MG capsule Take 50 mg by mouth every 4 (four) hours as needed. For severe itching      . folic acid ([name]) 1 MG tablet Take 1 mg by mouth daily.       Marland Kitchen glipiZIDE ([name]) 5 MG 24 hr tablet Take 5 mg by mouth daily.       . hydrOXYzine ([name]) 50 MG tablet Take 50 mg by mouth every 6 (six) hours as needed. For itching.      Marland Kitchen ibuprofen (ADVIL,MOTRIN) 200 MG tablet Take 200 mg by mouth 2 (two) times daily. Scheduled        . insulin detemir ([name]) 100 UNIT/ML injection Inject 15 Units into the skin at bedtime.      . lidocaine ([name]) 5 % Place 1 patch onto the skin See admin instructions. Apply to affected area on 12 hours and off 12 hours. Apply patch at 0800 and remove at 2000 pm.      . lisinopril ([name],[name]) 10 MG tablet Take 10 mg by mouth daily.       Marland Kitchen loratadine (CLARITIN) 10 MG tablet Take 10 mg by  mouth daily.      . metFORMIN ([name]) 1000 MG tablet Take 1,000 mg by mouth 2 (two) times daily with a meal.      . niacin ([name]) 1000 MG CR tablet Take 1,000 mg by mouth 2 (two) times daily.      . niacin 100 MG tablet Take 100 mg by mouth 2 (two) times daily.        . Omega-3 Fatty Acids (FISH OIL) 500 MG CAPS Take 500 mg by mouth 3 (three) times daily.      Marland Kitchen oxyCODONE-acetaminophen (PERCOCET) 7.5-325 MG per tablet Take 1 tablet by mouth every 8 (eight) hours as needed. For pain.      . pantoprazole (PROTONIX) 20 MG tablet Take 20 mg by mouth daily as needed. For indigestion      . ranitidine ([name]) 300 MG tablet Take 300 mg by mouth 2 (two) times daily.       . Tamsulosin HCl (FLOMAX) 0.4 MG CAPS Take 0.4 mg by mouth at bedtime.         Home: Home Living Lives With: Alone Receives Help From: Friend(s);Family Type of Home: Apartment Home Layout: One level Home Access: Stairs to enter [employer] of Steps: 2 Bathroom Shower/Tub: Chief Operating Officer Equipment:  Shower chair with back;Walker - rolling Additional Comments: pt has teacup poodle "sugar" 14 yos. 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  Functional History: Prior Function Level of Independence: Independent with basic ADLs;Independent with homemaking with ambulation Able to Take Stairs?: Yes Driving: No Vocation: Retired Comments: friends Pension scheme manager Status:  Mobility:   Transfers Transfers: Yes Sit to Stand: 2: Max assist;With upper extremity assist;From chair/3-in-1 Sit to Stand Details (indicate cue type and reason): Patient required verbal cues for safe scooting to edge of chair for stand initiation as tendency to pull on rolling walker rather than use arm rests. Verbal cues for correct hand placement to and from rollling walker and education not to excessively push with upper extremities secondary to  surgery. Patient with poor sequencing of stand leading to posterior lean secondary to premature hip extension and latent knee extension. Stand to Sit: To chair/3-in-1;With upper extremity assist;3: Mod assist Stand to Sit Details: Poor control of descent without assistance      ADL: ADL Eating/Feeding: Performed;Set up;Other (comment) (limited PO intake of breakfast) Where Assessed - Eating/Feeding: Chair Lower Body Bathing: Simulated;Moderate assistance Lower Body Bathing Details (indicate cue type and reason): Pt with increased pain when crossing legs to reach bil lower legs.  Mod assist to reach bil lower legs Where Assessed - Lower Body Bathing: Sit to stand from chair Lower Body Dressing: Performed;Moderate assistance Lower Body Dressing Details (indicate cue type and reason): pt able to c Lt LE but unable to cross Rt LE with sitting. Pt able to touch Lt LE foot Where Assessed - Lower Body Dressing: Sitting, chair;Unsupported ADL Comments: Pt total A with cerical collar. Pt with two aspen collars present in room. MD [name] present briefly during evaluation and states pt able to wear either collar that helps pt with comfort level.  Cognition: Cognition Arousal/Alertness: Awake/alert Orientation Level: Oriented X4 Cognition Arousal/Alertness: Awake/alert Overall Cognitive Status: Impaired (Poor judgement regarding D/C home based on current function) Orientation Level: Oriented X4  Blood pressure 119/62, pulse 93, temperature 97.9 F (36.6 C), temperature source Oral, resp. rate 15, height 6' (1.829 m), weight 87.8 kg (193 lb 9 oz), SpO2 99.00%. Physical Exam  Constitutional: He is oriented to person, place, and time. He appears well-developed.  HENT:  Head: Normocephalic.  Neck:       Hard cervical collar in place  Cardiovascular: Normal rate and regular rhythm.   Pulmonary/Chest: Effort normal. He has no wheezes.  Abdominal: He exhibits no distension. There is no tenderness.    Musculoskeletal: He exhibits no edema.  Neurological: He is alert and oriented to person, place, and time.  Skin:       Surgical site clean and dry  Psychiatric: He has a normal mood and affect.  motor strength is 4/5 in the right deltoid bicep triceps grip. In the left upper extremity has 5 over 5 strength in the deltoid bicep triceps grip In the lower extremity he has/5 strength in the hip flexor, knee extensors, ankle dorsiflexor and plantar flexor bilaterally. Sensation is reduced to light touch in the left foot but intact in the right foot. He has paresthesias in his fingers but is able to distinguish which fingers touched Cognition appears intact. Deep tendon reflexes are 3+ bilateral patellar and bilateral Achilles Results for orders placed during the hospital encounter of 07/21/11 (from the past 24 hour(s))  GLUCOSE, CAPILLARY     Status: Abnormal   Collection Time  07/21/11  4:01 PM      Component Value Range   Glucose-Capillary 159 (*) 70 - 99 (mg/dL)   Comment 1 Documented in Chart     Comment 2 Notify RN    CBC     Status: Abnormal   Collection Time   07/21/11  6:11 PM      Component Value Range   WBC 10.3  4.0 - 10.5 (K/uL)   RBC 3.44 (*) 4.22 - 5.81 (MIL/uL)   Hemoglobin 11.3 (*) 13.0 - 17.0 (g/dL)   HCT 40.9 (*) 81.1 - 52.0 (%)   MCV 95.3  78.0 - 100.0 (fL)   MCH 32.8  26.0 - 34.0 (pg)   MCHC 34.5  30.0 - 36.0 (g/dL)   RDW 91.4  78.2 - 95.6 (%)   Platelets 168  150 - 400 (K/uL)  CREATININE, SERUM     Status: Normal   Collection Time   07/21/11  6:11 PM      Component Value Range   Creatinine, Ser 0.68  0.50 - 1.35 (mg/dL)   GFR calc non Af Amer >90  >90 (mL/min)   GFR calc Af Amer >90  >90 (mL/min)  GLUCOSE, CAPILLARY     Status: Abnormal   Collection Time   07/21/11  7:30 PM      Component Value Range   Glucose-Capillary 200 (*) 70 - 99 (mg/dL)   Comment 1 Notify RN     Comment 2 Documented in Chart    GLUCOSE, CAPILLARY     Status: Abnormal   Collection  Time   07/22/11  8:17 AM      Component Value Range   Glucose-Capillary 125 (*) 70 - 99 (mg/dL)   Dg Cervical Spine 2-3 Views  07/21/2011  *RADIOLOGY REPORT*  Clinical Data: 71 year old male undergoing cervical spine surgery.  CERVICAL SPINE - 2-3 VIEW  Comparison: CT cervical myelogram 05/26/2011.  Fluoroscopy time of 0.3 minutes was utilized.  Findings: Intraoperative fluoroscopic spot views of the cervical spine in the frontal and lateral projection.  On the lateral view from 1438 hours the surgical probe is at the C2-C3 lamina level, and multiple laminar screws have been placed posteriorly from C3 inferiorly. Also, the C3 and inferior levels appear to have been decompressed.  C3 ACDF hardware re-identified.  On the frontal view, the surgical probe projects over the lateral masses of C2. Bilateral laminar screws again noted.  IMPRESSION: Posterior cervical decompression and placement of posterior laminar screws from C3 inferiorly.  Original Report Authenticated By: Harley Hallmark, M.D.    Assessment/Plan: Diagnosis: cervical myelopathy status post posterior decompression and fusion C3-T1 levels. The patient has residual gait disorder as well as paresthesias in the right upper extremity and has increased tone in the lower extremities 1. Does the need for close, 24 hr/day medical supervision in concert with the patient's rehab needs make it unreasonable for this patient to be served in a less intensive setting? Yes 2. Co-Morbidities requiring supervision/potential complications: diabetes, coronary artery disease, hypertension, low back pain, pain control 3. Due to bladder management, bowel management, skin/wound care, disease management, medication administration and pain management, does the patient require 24 hr/day rehab nursing? Yes 4. Does the patient require coordinated care of a physician, rehab nurse, PT (1-2 hrs/day, 5 days/week) and OT (1-2 hrs/day, 5 days/week) to address physical and  functional deficits in the context of the above medical diagnosis(es)? Yes Addressing deficits in the following areas: balance, bathing, dressing, endurance, grooming, locomotion, strength, toileting  and transferring 5. Can the patient actively participate in an intensive therapy program of at least 3 hrs of therapy per day at least 5 days per week? Yes 6. The potential for patient to make measurable gains while on inpatient rehab is excellent 7. Anticipated functional outcomes upon discharge from inpatients are modified independent to supervision PT, modified independent to supervision with ADLs OT, not applicable SLP 8. Estimated rehab length of stay to reach the above functional goals is: 10 days 9. Does the patient have adequate social supports to accommodate these discharge functional goals? Potentially 10. Anticipated D/C setting: Home 11. Anticipated post D/C treatments: HH therapy 12. Overall Rehab/Functional Prognosis: good  RECOMMENDATIONS: This patient's condition is appropriate for continued rehabilitative care in the following setting: CIR Patient has agreed to participate in recommended program. Yes Note that insurance prior authorization may be required for reimbursement for recommended care.  Comment:   ANGIULLI,DANIEL J. 07/22/2011

## 2011-07-22 NOTE — Op Note (Signed)
NAMELINSEY, Roy Hickman NO.:  [id number]  MEDICAL RECORD NO.:  [id number]  LOCATION:  3112                         FACILITY:  [facility]  PHYSICIAN:  Hilda Lias, M.D.   DATE OF BIRTH:  Oct 14, 1940  DATE OF PROCEDURE:  07/21/2011 DATE OF DISCHARGE:                              OPERATIVE REPORT   PREOPERATIVE DIAGNOSES:  Cervical myelopathy, status post C3-C7 anterior cervical fusion.  POSTOPERATIVE DIAGNOSES:  Cervical myelopathy, status post C3-C7 anterior cervical fusion.  PROCEDURE PERFORMED:  Bilateral 3, 4, 5, 6, 7 and thoracic 1 laminectomy, posterolateral arthrodesis with lateral screw mass from C3- C7, posterolateral arthrodesis with autograft, Vitoss.  Fluoroscopy.  SURGEON:  Hilda Lias, MD  ASSISTANT:  Coletta Memos, MD  CLINICAL HISTORY:  Roy Hickman is a gentleman who underwent anterior decompression.  He was seen in my office on several occasions but he ended up in the emergency room because he was unable to ambulate.  He was admitted by the medical service.  MRI of the lumbar spine mostly showed degenerative disk disease.  The patient was on p.o. Plavix.  The patient was taken off Plavix.  He had an outpatient cervical myelogram, which showed degenerative disk disease with stenosis from C3 down to thoracic 1.  The risk was fully explained to him and his family.  They were in my office several weeks ago.  They knew about the possibility of no improvement whatsoever, infection, CSF leak, hematoma, paralysis, and need for further surgery.  PROCEDURE IN DETAIL:  Roy Hickman was taken to the OR.  After intubation, 3 pin hip holder was applied to the hip.  Then he was positioned on prone manner.  The back of the hip and neck were shaved with DuraPrep. Drapes were applied.  Midline incision for C1-C2 down to C7, T1, T2 was done.  Muscle retracted laterally.  At the end, we were able to identify spinal stenosis from C2 down to C7-T1.   There was  severe arthropathy up to the point that in the left side upper part as well as on the right side, it was difficult to see any anatomy in relation to the facet.  We started dissection by removing the spinal process of 3 down to thoracic 1 and with the Kerrison punch as well as the drill, laminectomy for C3 to thoracic 1 was done.  Good decompression was achieved.  Then using the C-arm, fluoroscopy, AP view and taking lateral view, we drilled the lateral mass of C3, 4, 5, 6, 7.  Screws of about 3.5-4 mm with a length of 12 were inserted at those 2 levels.  Since he has solid fusion anterior at the level of C7-T1, we decided not to involve thoracic 1 in the fusion.  Then the screws were kept in place with the rod.  Caps were used to keep the screws as well as the rods in place.  Then with the drill, the correction point, we drilled the lateral aspect of the facet from C2-3 down to thoracic 1.  Then a mixture of Vitoss as well as autograft was used for arthrodesis. Although we achieved hemostasis, we left a large Hemovac drain.  Then the wound was closed in different layers with Vicryl and the skin with the staple.  The patient is going to go to the floor.  Eventually we were able to get involved rehabilitation to help [place] with this rehabilitation.          ______________________________ Hilda Lias, M.D.     EB/MEDQ  D:  07/21/2011  T:  07/22/2011  Job:  [id number]

## 2011-07-22 NOTE — Progress Notes (Signed)
Patient ID: Roy Hickman, male   DOB: 24-Sep-1940, 71 y.o.   MRN: [id number] hemovac working well. Neuro unchanged. Pt helping . rehab to see.

## 2011-07-22 NOTE — Progress Notes (Addendum)
Inpatient Diabetes Program Recommendations  AACE/ADA: New Consensus Statement on Inpatient Glycemic Control (2009)  Target Ranges:  Prepandial:   less than 140 mg/dL      Peak postprandial:   less than 180 mg/dL (1-2 hours)      Critically ill patients:  140 - 180 mg/dL   Reason for Visit: Results for Roy Hickman, Roy Hickman (MRN [id number]) as of 07/22/2011 11:45  Ref. Range 07/21/2011 08:10 07/21/2011 10:10 07/21/2011 16:01 07/21/2011 19:30 07/22/2011 08:17  Glucose-Capillary Latest Range: 70-99 mg/dL 409 (H) 811 (H) 914 (H) 200 (H) 125 (H)  Please check CBG's tid with meals and HS.  Patient is on [name] insulin in the hospital.  Inpatient Diabetes Program Recommendations Correction (SSI): Add moderate correction Novolog tid with meals HgbA1C: To determine prehospitalization glycemic control.  Note: Will follow.

## 2011-07-22 NOTE — Progress Notes (Signed)
Occupational Therapy Evaluation Patient Details Name: Roy Hickman MRN: [id number] DOB: [birth year] Today's Date: 07/22/2011  Problem List:  Patient Active Problem List  Diagnoses  . Difficulty walking  . CAD (coronary artery disease)  . DM2 (diabetes mellitus, type 2)  . HTN (hypertension)  . Low back pain    Past Medical History:  Past Medical History  Diagnosis Date  . Hypertension   . Diabetes mellitus   . MI (mitral incompetence)   . Myocardial infarction 2007  . Coronary artery disease   . GERD (gastroesophageal reflux disease)    Past Surgical History:  Past Surgical History  Procedure Date  . Elbow surgery     left  . Laminectomy   . Cervical spine surgery   . Coronary artery bypass graft   . Back surgery   . Posterior cervical fusion/foraminotomy 07/21/2011    Procedure: POSTERIOR CERVICAL FUSION/FORAMINOTOMY LEVEL 5;  Surgeon: Karn Cassis, MD;  Location: [facility];  Service: Neurosurgery;  Laterality: N/A;  Cervical three to Thoracic one Laminectomy, posterolateral arthrodesis, with Lateral Mass screws    OT Assessment/Plan/Recommendation OT Assessment OT Recommendation/Assessment: Patient will need skilled OT in the acute care venue OT Problem List: Decreased activity tolerance;Impaired balance (sitting and/or standing);Decreased cognition;Decreased knowledge of use of DME or AE;Decreased knowledge of precautions;Impaired sensation;Pain Barriers to Discharge: Decreased caregiver support OT Therapy Diagnosis : Acute pain;Generalized weakness;Cognitive deficits OT Plan OT Frequency: Min 1X/week OT Treatment/Interventions: Self-care/ADL training;DME and/or AE instruction;Therapeutic activities;Balance training OT Recommendation Follow Up Recommendations: Skilled nursing facility Equipment Recommended: Defer to next venue Individuals Consulted Consulted and Agree with Results and Recommendations: Patient OT Goals Acute Rehab OT Goals OT Goal  Formulation: With patient Time For Goal Achievement: 2 weeks ADL Goals Pt Will Perform Grooming: with modified independence;Standing at sink Pt Will Perform Lower Body Dressing: with modified independence;Sit to stand from chair;Sit to stand from bed Pt Will Transfer to Toilet: with modified independence;Stand pivot transfer;with DME;3-in-1 Miscellaneous OT Goals Miscellaneous OT Goal #1: Pt performed bed mobility with mod I in prep for EOB ADLs.  OT Evaluation Precautions/Restrictions  Precautions Precautions: Fall Precaution Comments: Cervical Required Braces or Orthoses: Yes Cervical Brace: Hard collar;Applied in sitting position Restrictions Weight Bearing Restrictions: Yes Other Position/Activity Restrictions: limit UE use with cervical precautions Prior Functioning Home Living Lives With: Alone Receives Help From: Friend(s);Family Type of Home: Apartment Home Layout: One level Home Access: Stairs to enter [employer] of Steps: 2 Bathroom Shower/Tub: Forensic scientist: Standard Home Adaptive Equipment: Shower chair with back;Walker - rolling Additional Comments: pt has teacup poodle "sugar" 14 yos. 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 Prior Function Level of Independence: Independent with basic ADLs;Independent with homemaking with ambulation Able to Take Stairs?: Yes Driving: No Vocation: Retired ADL ADL Eating/Feeding: Performed;Set up;Other (comment) Where Assessed - Eating/Feeding: Chair Lower Body Bathing: Simulated;Moderate assistance Lower Body Bathing Details (indicate cue type and reason): Pt with increased pain when crossing legs to reach bil lower legs.  Mod assist to reach bil lower legs Where Assessed - Lower Body Bathing: Sit to stand from chair Lower Body Dressing: Performed;Moderate assistance Lower Body Dressing Details (indicate cue type and reason): pt able to c Lt LE but unable  to cross Rt LE with sitting. Pt able to touch Lt LE foot Where Assessed - Lower Body Dressing: Sitting, chair;Unsupported ADL Comments: Pt total A with cerical collar. Pt with two aspen collars present in room. MD  Botero present briefly during evaluation and states pt able to wear either collar that helps pt with comfort level. Vision/Perception    Cognition Cognition Arousal/Alertness: Awake/alert Overall Cognitive Status: Impaired (Poor judgement regarding D/C home based on current function) Orientation Level: Oriented X4 Sensation/Coordination Sensation Light Touch: Impaired Detail Light Touch Impaired Details: Impaired LUE;Impaired RUE Additional Comments: Bilateral hands - decreased sensation to light touch Coordination Gross Motor Movements are Fluid and Coordinated: Yes Extremity Assessment RUE Assessment RUE Assessment: Within Functional Limits LUE Assessment LUE Assessment: Within Functional Limits Mobility  Transfers Sit to Stand: 2: Max assist;From chair/3-in-1;With upper extremity assist;With armrests Stand to Sit: 2: Max assist;With upper extremity assist;With armrests;To chair/3-in-1 Exercises   End of Session OT - End of Session Equipment Utilized During Treatment: Gait belt;Cervical collar Activity Tolerance: Patient limited by fatigue Patient left: in chair;with call bell in reach General Behavior During Session: [facility] for tasks performed   Cipriano Mile 07/22/2011, 3:26 PM  07/22/2011 Cipriano Mile OTR/L Pager [phone] Office [phone]

## 2011-07-23 LAB — GLUCOSE, CAPILLARY
Glucose-Capillary: 168 mg/dL — ABNORMAL HIGH (ref 70–99)
Glucose-Capillary: 153 mg/dL — ABNORMAL HIGH (ref 70–99)
Glucose-Capillary: 149 mg/dL — ABNORMAL HIGH (ref 70–99)

## 2011-07-23 MED ORDER — DIPHENHYDRAMINE HCL 25 MG PO CAPS: 50.0000 mg | ORAL_CAPSULE | Freq: Four times a day (QID) | ORAL | Status: DC | PRN

## 2011-07-23 NOTE — Progress Notes (Deleted)
Pt not able to urinate. Bladder scan showed 710 ml. I/O cath drained 600 ml of urine.

## 2011-07-23 NOTE — Progress Notes (Signed)
Patient ID: Roy Hickman, male   DOB: Sep 23, 1940, 71 y.o.   MRN: [id number] Stable.hemovac to de removed. C/o incisional pain. Seen by rehabilitation medicine.

## 2011-07-23 NOTE — Progress Notes (Signed)
[employer] Medicare will not approve inpatient acute rehabilitation. They will approve SNF. I have informed patient and he is very hesitant to agree. He has been to [facility] before and does not want to go there again. I contacted his sister, Roy Hickman, by phone and she is aware. Roy Hickman arriving after lunch and would like to discuss with SW SNF placement if patient can be convinced he can not go home without 24/7 support which is not available. I will alert SW. Please call with any questions. Pager [phone].

## 2011-07-23 NOTE — Progress Notes (Signed)
UR COMPLETED  

## 2011-07-23 NOTE — Progress Notes (Signed)
Clinical Social Worker received a consult for "SNF." CIR is recommending SNF at this time. CSW met with pt to address consult. CSW introduced herself and explained role of social work. CSW explained SNF. Pt shared that he lives by himself and has supportive sisters and neighbors. PT would like this CSW to meet with him and his sisters when they arrive to discuss discharge plan.   CSW followed up with pt when his sisters were present. Pt's sister confirmed that pt was at a facility prior to this hospital admission. Pt and sisters are agreeable to SNF placement as pt does not have 24 hour supervision. However, they are interested in facilities closer to where they live. Pt's sisters believed pt would benefit from rehab prior to returning home.   CSW will initate SNF search for [facility] and follow up with bed offers. CSW will continue to follow to facilitate transfer to SNF.   Dede Query, MSW, Theresia Majors [phone]

## 2011-07-23 NOTE — Progress Notes (Signed)
Pt not able to urinate. Bladder scan showed 610 ml. I/O cath drained 600 ml of urine

## 2011-07-23 NOTE — Progress Notes (Signed)
Physical Therapy Treatment Patient Details Name: Roy Hickman MRN: [id number] DOB: 04-Jun-1941 Today's Date: 07/23/2011  PT Assessment/Plan  PT - Assessment/Plan Comments on Treatment Session: Patient with improved functional mobility today. States he has a total of 5 sisters 4 of which can help. He also states assistance available from others in housing area. Patient states needs rehab for improved ambulatory and transfer abilities only as can receive assistance for all other aspects of care. at discharge PT Plan: Discharge plan remains appropriate;Frequency remains appropriate Follow Up Recommendations: Inpatient Rehab Equipment Recommended: Defer to next venue PT Goals  Acute Rehab PT Goals PT Goal: Rolling Supine to Right Side - Progress: Progressing toward goal PT Goal: Supine/Side to Sit - Progress: Progressing toward goal PT Goal: Sit to Stand - Progress: Progressing toward goal PT Goal: Stand to Sit - Progress: Progressing toward goal PT Transfer Goal: Bed to Chair/Chair to Bed - Progress: Progressing toward goal PT Goal: Stand - Progress: Progressing toward goal  PT Treatment Precautions/Restrictions  Precautions Precautions: Fall Precaution Comments: Cervical Required Braces or Orthoses: Yes Cervical Brace: Hard collar (at all times) Restrictions Weight Bearing Restrictions: No Other Position/Activity Restrictions: limit UE use with cervical precautions Mobility (including Balance) Bed Mobility Bed Mobility: Yes Rolling Right: 5: Supervision Rolling Right Details (indicate cue type and reason): Increased time to complete task. Patient using momentum to prevent excessive upper extremity use. Verbal cues for sequencing of task. Right Sidelying to Sit: 4: Min assist Right Sidelying to Sit Details (indicate cue type and reason): Min-guard assistance - Patient able to complete task without physical assistance, but cues provided for efficiency as patient having difficulty  elevating trunk with technique used. Sitting - Scoot to Edge of Bed: 4: Min assist Sitting - Scoot to [place] of Bed Details (indicate cue type and reason): Assistance provided to prevent excessive upper extremity use. Transfers Sit to Stand: 3: Mod assist;From bed;With upper extremity assist Sit to Stand Details (indicate cue type and reason): Verbal cues for upright posture and minimizing arm use. Also provided facilitation to improve sequencing of hip and knee extension. Stand to Sit: 4: Min assist Stand to Sit Details: Assistance to control descent. Poor safety - sat prematurely and cues provided for safer execution of task. Stand Pivot Transfers: 3: Mod assist Stand Pivot Transfer Details (indicate cue type and reason): Patient performed pivot without stepping. Assistance provided for safety and cues to prevent pull on arm rest of recliner to initiate  Posture/Postural Control Postural Limitations: Improved posture today with decline in posterior lean Static Standing Balance Static Standing - Balance Support: No upper extremity supported Static Standing - Level of Assistance: 3: Mod assist Exercise  General Exercises - Lower Extremity Ankle Circles/Pumps: AROM;Both;15 reps;Supine Quad Sets: AROM;Both;5 reps Gluteal Sets: AROM;Both;5 reps End of Session PT - End of Session Equipment Utilized During Treatment: Gait belt Activity Tolerance: Patient tolerated treatment well Patient left: in chair;with call bell in reach Nurse Communication: Mobility status for transfers General Behavior During Session: [facility] for tasks performed Cognition: Impaired Cognitive Impairment: Decreased safety/judgement  Edwyna Perfect, PT  Pager [phone]  07/23/2011, 8:43 AM

## 2011-07-24 LAB — GLUCOSE, CAPILLARY
Glucose-Capillary: 117 mg/dL — ABNORMAL HIGH (ref 70–99)
Glucose-Capillary: 151 mg/dL — ABNORMAL HIGH (ref 70–99)

## 2011-07-24 NOTE — Progress Notes (Signed)
Physical Therapy Treatment Patient Details Name: Roy Hickman MRN: [id number] DOB: 02-12-1941 Today's Date: 07/24/2011  PT Assessment/Plan  PT - Assessment/Plan Comments on Treatment Session: Improved mobiltiy today as pt was able to ambulate. Still needs 2 person assist for safety. Noted that insurance will not approve/authorize CIR. Pt  will need ST-SNF before discharge home to increased independencd with mobility for safety at home. PT Plan: Frequency remains appropriate;Discharge plan needs to be updated PT Frequency: Min 5X/week Follow Up Recommendations: Skilled nursing facility Equipment Recommended: Defer to next venue PT Goals  Acute Rehab PT Goals PT Goal: Rolling Supine to Right Side - Progress: Progressing toward goal PT Goal: Supine/Side to Sit - Progress: Progressing toward goal PT Goal: Sit to Stand - Progress: Progressing toward goal PT Goal: Stand to Sit - Progress: Progressing toward goal PT Goal: Ambulate - Progress: Progressing toward goal  PT Treatment Precautions/Restrictions  Precautions Precautions: Fall Precaution Comments: Cervical Required Braces or Orthoses: Yes Cervical Brace: Hard collar (on at all times) Restrictions Weight Bearing Restrictions: No Other Position/Activity Restrictions: limit UE use with cervical precautions Mobility (including Balance) Bed Mobility Rolling Right: With rail;Other (comment);5: Supervision (HOB flat) Rolling Right Details (indicate cue type and reason): cues for correct technique to spare neck from increased pain(log roll). assist needed with trunk transitions. Right Sidelying to Sit: 4: Min assist;With rails;HOB flat Right Sidelying to Sit Details (indicate cue type and reason): assist to transistion trunk from lying to sitting postion. Sitting - Scoot to Edge of Bed: 5: Supervision Sitting - Scoot to [place] of Bed Details (indicate cue type and reason): cues for reciprocal hip scooting without using arms Transfers Sit  to Stand: 3: Mod assist;From bed;With upper extremity assist Sit to Stand Details (indicate cue type and reason): cues for ant wt shifting and pushing down through legs to decrease UE use with standing. cues/assist needed for hip/trunk ext once standing. Stand to Sit: 4: Min assist;With upper extremity assist;To chair/3-in-1 Stand to Sit Details: assist needed to control descent into chair. cues for hand plancement with sitting. Ambulation/Gait Ambulation/Gait: Yes Ambulation/Gait Assistance: 3: Mod assist;Other (comment) (+1 for safety) Ambulation/Gait Assistance Details (indicate cue type and reason): cues for upright posture, to decrease posterior lean and for increased step lenght with increased DF to increase foot clearance. Ambulation Distance (Feet): 14 Feet Assistive device: 1 person hand held assist;Other (Comment) (pt held onto PTA  forearms for support) Gait Pattern: Step-to pattern;Shuffle;Decreased stride length;Decreased step length - right;Decreased step length - left;Decreased dorsiflexion - right;Decreased dorsiflexion - left (posterior trunk lean) Gait velocity: decreased gait speed  Posture/Postural Control Posture/Postural Control: Postural limitations Postural Limitations: posterior lean with standing and gait that requires assist/cues to correct for safety with mobility Exercise    End of Session PT - End of Session Equipment Utilized During Treatment: Gait belt;Cervical collar Activity Tolerance: Patient limited by pain;Patient limited by fatigue;Patient tolerated treatment well Patient left: in chair;with call bell in reach Nurse Communication: Mobility status for transfers;Mobility status for ambulation General Behavior During Session: [facility] for tasks performed Cognition: Impaired Cognitive Impairment: decreasd saftey awareness, decreased knowledge of deficits and decreased insight into mobility limitations.  Sallyanne Kuster 07/24/2011, 2:17 PM  Sallyanne Kuster,  PTA Office- [phone]

## 2011-07-24 NOTE — Progress Notes (Signed)
Patient ID: Roy Hickman, male   DOB: September 02, 1940, 71 y.o.   MRN: [id number] Subjective: Patient reports neck soreness. PT says he's still unsteady on feet. Walked for first time today.  Objective: Vital signs in last 24 hours: Temp:  [97.5 F (36.4 C)-98.4 F (36.9 C)] 97.6 F (36.4 C) (01/19 0828) Pulse Rate:  [80-99] 82  (01/19 0828) Resp:  [18-20] 20  (01/19 0828) BP: (105-132)/(67-80) 126/80 mmHg (01/19 0828) SpO2:  [94 %-98 %] 94 % (01/19 0828)  Intake/Output from previous day: 01/18 0701 - 01/19 0700 In: 1120 [P.O.:1120] Out: 600 [Urine:600] Intake/Output this shift:    Neurologic: Gait: Antalgic Grips ok  Lab Results: Lab Results  Component Value Date   WBC 10.3 07/21/2011   HGB 11.3* 07/21/2011   HCT 32.8* 07/21/2011   MCV 95.3 07/21/2011   PLT 168 07/21/2011   Lab Results  Component Value Date   INR 1.07 06/22/2011   BMET Lab Results  Component Value Date   NA 140 07/21/2011   K 3.9 07/21/2011   CL 105 07/21/2011   CO2 25 07/21/2011   GLUCOSE 178* 07/21/2011   BUN 14 07/21/2011   CREATININE 0.68 07/21/2011   CALCIUM 9.2 07/21/2011    Studies/Results: No results found.  Assessment/Plan: Await SNF, cont PT/OT   LOS: 3 days    Roy Hickman S 07/24/2011, 9:10 AM

## 2011-07-25 LAB — GLUCOSE, CAPILLARY
Glucose-Capillary: 119 mg/dL — ABNORMAL HIGH (ref 70–99)
Glucose-Capillary: 127 mg/dL — ABNORMAL HIGH (ref 70–99)
Glucose-Capillary: 143 mg/dL — ABNORMAL HIGH (ref 70–99)
Glucose-Capillary: 135 mg/dL — ABNORMAL HIGH (ref 70–99)
Glucose-Capillary: 115 mg/dL — ABNORMAL HIGH (ref 70–99)

## 2011-07-25 NOTE — Progress Notes (Signed)
Discussed above change in dc plan with Sallyanne Kuster, PTA; In agreement with dc plan update. Thanks,  [place], [place] [phone]

## 2011-07-25 NOTE — Progress Notes (Signed)
Pt passed urine in the urinal .

## 2011-07-25 NOTE — Progress Notes (Signed)
Doing well. C/o appropriate incisional soreness. Unsteady on feet  Temp:  [97.4 F (36.3 C)-98.1 F (36.7 C)] 97.9 F (36.6 C) (01/20 0600) Pulse Rate:  [80-87] 83  (01/20 0600) Resp:  [16-20] 18  (01/20 0600) BP: (94-113)/(61-72) 97/61 mmHg (01/20 0600) SpO2:  [94 %-99 %] 94 % (01/20 0600) Good strength and sensation Incision CDI  Plan: contnue Tx's - ?placement

## 2011-07-26 LAB — GLUCOSE, CAPILLARY
Glucose-Capillary: 99 mg/dL (ref 70–99)
Glucose-Capillary: 131 mg/dL — ABNORMAL HIGH (ref 70–99)

## 2011-07-26 NOTE — Progress Notes (Signed)
Physical Therapy Treatment Patient Details Name: Roy Hickman MRN: [id number] DOB: 01/20/1941 Today's Date: 07/26/2011  PT Assessment/Plan  PT - Assessment/Plan Comments on Treatment Session: pt continues to improve mobility today.  requiring decreased A for mobility and increasing ambulation distance.  pt still remains painful and cueing for safety.   PT Plan: Discharge plan remains appropriate;Frequency remains appropriate PT Frequency: Min 5X/week Follow Up Recommendations: Skilled nursing facility Equipment Recommended: Defer to next venue PT Goals  Acute Rehab PT Goals PT Goal: Rolling Supine to Right Side - Progress: Progressing toward goal PT Goal: Rolling Supine to Left Side - Progress: Not met PT Goal: Supine/Side to Sit - Progress: Progressing toward goal PT Goal: Sit to Supine/Side - Progress: Not met PT Goal: Sit to Stand - Progress: Progressing toward goal PT Goal: Stand to Sit - Progress: Progressing toward goal PT Transfer Goal: Bed to Chair/Chair to Bed - Progress: Progressing toward goal PT Goal: Stand - Progress: Progressing toward goal PT Goal: Ambulate - Progress: Progressing toward goal  PT Treatment Precautions/Restrictions  Precautions Precautions: Fall Precaution Comments: Cervical Required Braces or Orthoses: Yes Cervical Brace: Hard collar;Applied in sitting position Restrictions Weight Bearing Restrictions: No Other Position/Activity Restrictions: limit UE use with cervical precautions Mobility (including Balance) Bed Mobility Bed Mobility: Yes Rolling Right: 5: Supervision;With rail Rolling Right Details (indicate cue type and reason): pt needs increased time and cues for use of rail and movement of LEs Right Sidelying to Sit: 4: Min assist;With rails;HOB flat Right Sidelying to Sit Details (indicate cue type and reason): A for trunk only and cues for technique Sitting - Scoot to Edge of Bed: 5: Supervision Sitting - Scoot to [place] of Bed Details  (indicate cue type and reason): cues to minimize UE use and attempt sccot reciprocally.   Transfers Transfers: Yes Sit to Stand: 3: Mod assist;With upper extremity assist;From bed Sit to Stand Details (indicate cue type and reason): cues for anterior wt shift, use of UEs, trunk/hip extension.   Stand to Sit: 4: Min assist;With upper extremity assist;To chair/3-in-1 Stand to Sit Details: cues for UE use and to control descent.   Ambulation/Gait Ambulation/Gait: Yes Ambulation/Gait Assistance: 4: Min assist Ambulation/Gait Assistance Details (indicate cue type and reason): cues for positioning in RW, upright posture, increased step length, encouragement to increase distance  Ambulation Distance (Feet): 80 Feet Assistive device: Rolling walker Gait Pattern: Step-through pattern;Decreased stride length;Shuffle Stairs: No Wheelchair Mobility Wheelchair Mobility: No    Exercise    End of Session PT - End of Session Equipment Utilized During Treatment: Gait belt;Cervical collar Activity Tolerance: Patient limited by fatigue;Patient limited by pain Patient left: in chair;with call bell in reach Nurse Communication: Mobility status for transfers General Behavior During Session: [facility] for tasks performed Cognition: Impaired Cognitive Impairment: decreased safety awareness  Sunny Schlein,  [phone] 07/26/2011, 12:42 PM

## 2011-07-26 NOTE — Progress Notes (Signed)
CSW met with pt and family to address bed offers. CSW will expand search and follow up with "considering offer" as bed offers were limited. CSW provided support to pt and family. CSW will continue to follow to facilitate discharge to SNF.   Dede Query, MSW, Theresia Majors [phone]

## 2011-07-27 LAB — GLUCOSE, CAPILLARY
Glucose-Capillary: 81 mg/dL (ref 70–99)
Glucose-Capillary: 147 mg/dL — ABNORMAL HIGH (ref 70–99)

## 2011-07-27 NOTE — Progress Notes (Signed)
Pt is ready for discharge today to [facility]. Facility has received discharge paperwork. Pt and family are agreeable to discharge to SNF. [facility] is providing transportation. CSW signing off as no further social work needs identified.   Dede Query, MSW, Theresia Majors [phone]

## 2011-07-27 NOTE — Progress Notes (Signed)
Pt d/c instructions provided. Pt verbalized understanding. Pt iv d/c intact. Pt under no s/s distress. 

## 2011-07-27 NOTE — Progress Notes (Signed)
Utilization review completed. Zaccheaus Storlie, RN, BSN. 07/27/11 

## 2011-07-27 NOTE — Discharge Summary (Signed)
Physician Discharge Summary  Patient ID: GERSHON SHORTEN MRN: [id number] DOB/AGE: 1940-10-24 71 y.o.  Admit date: 07/21/2011 Discharge date: 07/27/2011  Admission Diagnoses:Cervical myelopathy, status post C3-C7 anterior  cervical fusion.   Discharge Diagnoses: Cervical myelopathy, status post C3-C7 anterior  cervical fusion.  Active Problems:  * No active hospital problems. *    Discharged Condition: fair  Hospital Course: Pt admitted on day of surgery. Underwent procedure below.  Pt making slow progress with therapy.  Still with problems ambhulating and SNF rehab recommended.  Pt d/c to SNF when accepted to continue rehab  Consults: none  Significant Diagnostic Studies: none  Treatments: surgery: Bilateral 3, 4, 5, 6, 7 and thoracic 1  laminectomy, posterolateral arthrodesis with lateral screw mass from C3-  C7, posterolateral arthrodesis with autograft, Vitoss. Fluoroscopy.   Discharge Exam: Blood pressure 162/83, pulse 78, temperature 97.7 F (36.5 C), temperature source Oral, resp. rate 20, height 6' (1.829 m), weight 87.8 kg (193 lb 9 oz), SpO2 95.00%. Wound:c/d/i  Disposition: SNF   Medication List  As of 07/27/2011  1:45 PM   TAKE these medications         ALPRAZolam 1 MG tablet   Commonly known as: XANAX   Take 1 mg by mouth 2 (two) times daily as needed. For anxiety.      atorvastatin 40 MG tablet   Commonly known as: LIPITOR   Take 40 mg by mouth at bedtime.      carvedilol 12.5 MG tablet   Commonly known as: COREG   Take 12.5 mg by mouth 2 (two) times daily with a meal.      clopidogrel 75 MG tablet   Commonly known as: PLAVIX   Take 75 mg by mouth daily with breakfast.      cyclobenzaprine 10 MG tablet   Commonly known as: FLEXERIL   Take 10 mg by mouth 3 (three) times daily as needed. For muscle spasm.      diphenhydrAMINE 50 MG capsule   Commonly known as: BENADRYL   Take 50 mg by mouth every 4 (four) hours as needed. For severe itching     Fish Oil 500 MG Caps   Take 500 mg by mouth 3 (three) times daily.      folic acid 1 MG tablet   Commonly known as: [name]   Take 1 mg by mouth daily.      glipiZIDE 5 MG 24 hr tablet   Commonly known as: [name]   Take 5 mg by mouth daily.      hydrOXYzine 50 MG tablet   Commonly known as: [name]   Take 50 mg by mouth every 6 (six) hours as needed. For itching.      ibuprofen 200 MG tablet   Commonly known as: ADVIL,MOTRIN   Take 200 mg by mouth 2 (two) times daily. Scheduled        insulin detemir 100 UNIT/ML injection   Commonly known as: [name]   Inject 15 Units into the skin at bedtime.      lidocaine 5 %   Commonly known as: [name]   Place 1 patch onto the skin See admin instructions. Apply to affected area on 12 hours and off 12 hours. Apply patch at 0800 and remove at 2000 pm.      lisinopril 10 MG tablet   Commonly known as: [name],[name]   Take 10 mg by mouth daily.      loratadine 10 MG tablet   Commonly known  as: CLARITIN   Take 10 mg by mouth daily.      metFORMIN 1000 MG tablet   Commonly known as: [name]   Take 1,000 mg by mouth 2 (two) times daily with a meal.      niacin 100 MG tablet   Take 100 mg by mouth 2 (two) times daily.      niacin 1000 MG CR tablet   Commonly known as: [name]   Take 1,000 mg by mouth 2 (two) times daily.      oxyCODONE-acetaminophen 7.5-325 MG per tablet   Commonly known as: PERCOCET   Take 1 tablet by mouth every 8 (eight) hours as needed. For pain.      pantoprazole 20 MG tablet   Commonly known as: PROTONIX   Take 20 mg by mouth daily as needed. For indigestion      ranitidine 300 MG tablet   Commonly known as: [name]   Take 300 mg by mouth 2 (two) times daily.      Tamsulosin HCl 0.4 MG Caps   Commonly known as: FLOMAX   Take 0.4 mg by mouth at bedtime.           F/U with Dr [name] in 1-2 weeks  Signed: Clydene Fake, MD 07/27/2011, 1:45 PM

## 2011-07-27 NOTE — Progress Notes (Signed)
Physical Therapy Treatment Patient Details Name: Roy Hickman MRN: [id number] DOB: 1941-05-04 Today's Date: 07/27/2011  PT Assessment/Plan   PT Goals     PT Treatment Precautions/Restrictions  Precautions Precautions: Fall Precaution Comments: Cervical Required Braces or Orthoses: Yes Cervical Brace: Hard collar;Applied in sitting position Restrictions Weight Bearing Restrictions: No Other Position/Activity Restrictions: limit UE use with cervical precautions Mobility (including Balance) Bed Mobility Rolling Right: 4: Min assist;With rail Right Sidelying to Sit: 3: Mod assist Right Sidelying to Sit Details (indicate cue type and reason): assist needed to get sitting balance EOB Sitting - Scoot to Edge of Bed: 4: Min assist Transfers Transfers: Yes Sit to Stand: 4: Min assist;With armrests;From bed Sit to Stand Details (indicate cue type and reason): pt putting hands on RW Stand to Sit: 2: Max assist Stand to Sit Details: pt unable to step backwards due to weakness and inability to extend trunk, max assist needed to control sit on EOB Stand Pivot Transfers: 2: Max assist Stand Pivot Transfer Details (indicate cue type and reason): assistance needed to manage rw and to hold trunk erect to step backwards for transfer. Ambulation/Gait Ambulation/Gait: Yes Ambulation/Gait Assistance: 3: Mod assist Ambulation Distance (Feet): 4 Feet Assistive device: Rolling walker Gait Pattern: Decreased step length - right;Decreased step length - left;Trunk flexed (pt not advancing feet and stepping in place) Stairs: No Wheelchair Mobility Wheelchair Mobility: No    Exercise  General Exercises - Lower Extremity Ankle Circles/Pumps: AROM;Both;10 reps Heel Slides: AAROM;Both;10 reps End of Session PT - End of Session Equipment Utilized During Treatment: Gait belt;Cervical collar Activity Tolerance: Patient limited by fatigue Patient left: in bed;with call bell in reach General Behavior  During Session: [facility] for tasks performed Cognition: Impaired Cognitive Impairment: difficulty processing cues for mobility.  Julian Reil 07/27/2011, 4:29 PM

## 2011-07-27 NOTE — Progress Notes (Signed)
Subjective: Patient reports "I'm a little better."  Objective: Vital signs in last 24 hours: Temp:  [97.6 F (36.4 C)-98.5 F (36.9 C)] 97.7 F (36.5 C) (01/22 0943) Pulse Rate:  [67-87] 78  (01/22 0943) Resp:  [18-20] 20  (01/22 0943) BP: (112-162)/(67-84) 162/83 mmHg (01/22 0943) SpO2:  [90 %-98 %] 95 % (01/22 0943)  Intake/Output from previous day: 01/21 0701 - 01/22 0700 In: 1000 [P.O.:1000] Out: 1600 [Urine:1600] Intake/Output this shift:    Alert, conversant. Denies pain at present. Tingling/numbness BUE persist, though improved.  Drsg dry, intact.  Pt would like to go home, but verbalizes understanding for need of SNF for safety and therapy.  Lab Results: No results found for this basename: WBC:2,HGB:2,HCT:2,PLT:2 in the last 72 hours BMET No results found for this basename: NA:2,K:2,CL:2,CO2:2,GLUCOSE:2,BUN:2,CREATININE:2,CALCIUM:2 in the last 72 hours  Studies/Results: No results found.  Assessment/Plan: Improved.  LOS: 6 days  D/c to SNF when bed available.   Georgiann Cocker 07/27/2011, 1:32 PM

## 2011-08-13 ENCOUNTER — Encounter (HOSPITAL_COMMUNITY): Payer: Self-pay | Admitting: Emergency Medicine

## 2011-08-13 ENCOUNTER — Emergency Department (HOSPITAL_COMMUNITY): Payer: Medicare Other

## 2011-08-13 ENCOUNTER — Inpatient Hospital Stay (HOSPITAL_COMMUNITY)
Admission: EM | Admit: 2011-08-13 | Discharge: 2011-08-20 | DRG: 871 | Disposition: A | Discharge status: Skilled Nursing Facility | Payer: Medicare Other | Attending: Internal Medicine | Admitting: Internal Medicine

## 2011-08-13 ENCOUNTER — Other Ambulatory Visit: Payer: Self-pay

## 2011-08-13 DIAGNOSIS — I10 Essential (primary) hypertension: Secondary | ICD-10-CM | POA: Diagnosis present

## 2011-08-13 DIAGNOSIS — R339 Retention of urine, unspecified: Secondary | ICD-10-CM

## 2011-08-13 DIAGNOSIS — E119 Type 2 diabetes mellitus without complications: Secondary | ICD-10-CM | POA: Diagnosis present

## 2011-08-13 DIAGNOSIS — D638 Anemia in other chronic diseases classified elsewhere: Secondary | ICD-10-CM | POA: Diagnosis present

## 2011-08-13 DIAGNOSIS — M7989 Other specified soft tissue disorders: Secondary | ICD-10-CM

## 2011-08-13 DIAGNOSIS — Z951 Presence of aortocoronary bypass graft: Secondary | ICD-10-CM

## 2011-08-13 DIAGNOSIS — N179 Acute kidney failure, unspecified: Secondary | ICD-10-CM | POA: Diagnosis present

## 2011-08-13 DIAGNOSIS — A419 Sepsis, unspecified organism: Principal | ICD-10-CM | POA: Diagnosis present

## 2011-08-13 DIAGNOSIS — E860 Dehydration: Secondary | ICD-10-CM

## 2011-08-13 DIAGNOSIS — G9340 Encephalopathy, unspecified: Secondary | ICD-10-CM | POA: Diagnosis present

## 2011-08-13 DIAGNOSIS — N400 Enlarged prostate without lower urinary tract symptoms: Secondary | ICD-10-CM | POA: Diagnosis present

## 2011-08-13 DIAGNOSIS — E876 Hypokalemia: Secondary | ICD-10-CM | POA: Diagnosis not present

## 2011-08-13 DIAGNOSIS — I2510 Atherosclerotic heart disease of native coronary artery without angina pectoris: Secondary | ICD-10-CM | POA: Diagnosis present

## 2011-08-13 DIAGNOSIS — I82409 Acute embolism and thrombosis of unspecified deep veins of unspecified lower extremity: Secondary | ICD-10-CM | POA: Diagnosis present

## 2011-08-13 DIAGNOSIS — I252 Old myocardial infarction: Secondary | ICD-10-CM

## 2011-08-13 DIAGNOSIS — IMO0001 Reserved for inherently not codable concepts without codable children: Secondary | ICD-10-CM | POA: Diagnosis present

## 2011-08-13 DIAGNOSIS — I82402 Acute embolism and thrombosis of unspecified deep veins of left lower extremity: Secondary | ICD-10-CM

## 2011-08-13 DIAGNOSIS — G92 Toxic encephalopathy: Secondary | ICD-10-CM | POA: Diagnosis present

## 2011-08-13 DIAGNOSIS — N390 Urinary tract infection, site not specified: Secondary | ICD-10-CM

## 2011-08-13 DIAGNOSIS — Z794 Long term (current) use of insulin: Secondary | ICD-10-CM

## 2011-08-13 DIAGNOSIS — K219 Gastro-esophageal reflux disease without esophagitis: Secondary | ICD-10-CM | POA: Diagnosis present

## 2011-08-13 DIAGNOSIS — I824Y9 Acute embolism and thrombosis of unspecified deep veins of unspecified proximal lower extremity: Secondary | ICD-10-CM | POA: Diagnosis present

## 2011-08-13 DIAGNOSIS — D649 Anemia, unspecified: Secondary | ICD-10-CM | POA: Diagnosis present

## 2011-08-13 DIAGNOSIS — N289 Disorder of kidney and ureter, unspecified: Secondary | ICD-10-CM

## 2011-08-13 DIAGNOSIS — G929 Unspecified toxic encephalopathy: Secondary | ICD-10-CM | POA: Diagnosis present

## 2011-08-13 DIAGNOSIS — Z981 Arthrodesis status: Secondary | ICD-10-CM

## 2011-08-13 DIAGNOSIS — F29 Unspecified psychosis not due to a substance or known physiological condition: Secondary | ICD-10-CM | POA: Diagnosis present

## 2011-08-13 LAB — URINE MICROSCOPIC-ADD ON

## 2011-08-13 LAB — BASIC METABOLIC PANEL
BUN: 43 mg/dL — ABNORMAL HIGH (ref 6–23)
CO2: 19 mEq/L (ref 19–32)
Chloride: 99 mEq/L (ref 96–112)
Creatinine, Ser: 2.45 mg/dL — ABNORMAL HIGH (ref 0.50–1.35)
GFR calc Af Amer: 29 mL/min — ABNORMAL LOW (ref >90)
Potassium: 4.7 mEq/L (ref 3.5–5.1)

## 2011-08-13 LAB — URINALYSIS, ROUTINE W REFLEX MICROSCOPIC
Glucose, UA: NEGATIVE mg/dL
Hgb urine dipstick: NEGATIVE
Ketones, ur: 15 mg/dL — AB
Specific Gravity, Urine: 1.033 — ABNORMAL HIGH (ref 1.005–1.030)
pH: 5 (ref 5.0–8.0)

## 2011-08-13 LAB — CBC
HCT: 28.1 % — ABNORMAL LOW (ref 39.0–52.0)
Hemoglobin: 9.5 g/dL — ABNORMAL LOW (ref 13.0–17.0)
MCHC: 33.8 g/dL (ref 30.0–36.0)
MCV: 96.2 fL (ref 78.0–100.0)
RDW: 13.1 % (ref 11.5–15.5)

## 2011-08-13 LAB — DIFFERENTIAL
Basophils Absolute: 0 10*3/uL (ref 0.0–0.1)
Basophils Relative: 0 % (ref 0–1)
Lymphocytes Relative: 7 % — ABNORMAL LOW (ref 12–46)
Lymphs Abs: 1.1 10*3/uL (ref 0.7–4.0)
Monocytes Relative: 9 % (ref 3–12)
Neutro Abs: 13.3 10*3/uL — ABNORMAL HIGH (ref 1.7–7.7)

## 2011-08-13 LAB — LACTIC ACID, PLASMA: Lactic Acid, Venous: 1.5 mmol/L (ref 0.5–2.2)

## 2011-08-13 LAB — POCT I-STAT TROPONIN I: Troponin i, poc: 0 ng/mL (ref 0.00–0.08)

## 2011-08-13 MED ORDER — SODIUM CHLORIDE 0.9 % IV SOLN
INTRAVENOUS | Status: DC
  Administered 2011-08-13: 19:00:00 via INTRAVENOUS

## 2011-08-13 MED ORDER — CLOPIDOGREL BISULFATE 75 MG PO TABS
75.0000 mg | ORAL_TABLET | Freq: Every day | ORAL | Status: DC
  Administered 2011-08-14 – 2011-08-20 (×7): 75 mg via ORAL
  Filled 2011-08-13 (×8): qty 1

## 2011-08-13 MED ORDER — CARVEDILOL 12.5 MG PO TABS
12.5000 mg | ORAL_TABLET | Freq: Two times a day (BID) | ORAL | Status: DC
  Administered 2011-08-14 – 2011-08-20 (×14): 12.5 mg via ORAL
  Filled 2011-08-13 (×18): qty 1

## 2011-08-13 MED ORDER — ONDANSETRON HCL 4 MG PO TABS
4.0000 mg | ORAL_TABLET | Freq: Four times a day (QID) | ORAL | Status: DC | PRN
  Administered 2011-08-15: 4 mg via ORAL
  Filled 2011-08-13: qty 1

## 2011-08-13 MED ORDER — ENOXAPARIN SODIUM 80 MG/0.8ML ~~LOC~~ SOLN
1.0000 mg/kg | Freq: Every day | SUBCUTANEOUS | Status: DC
  Administered 2011-08-14 (×2): 80 mg via SUBCUTANEOUS
  Filled 2011-08-13 (×3): qty 0.8

## 2011-08-13 MED ORDER — INSULIN ASPART 100 UNIT/ML ~~LOC~~ SOLN
0.0000 [IU] | Freq: Three times a day (TID) | SUBCUTANEOUS | Status: DC
  Administered 2011-08-14: 8 [IU] via SUBCUTANEOUS
  Administered 2011-08-14 – 2011-08-15 (×3): 5 [IU] via SUBCUTANEOUS
  Administered 2011-08-15: 3 [IU] via SUBCUTANEOUS
  Administered 2011-08-16: 2 [IU] via SUBCUTANEOUS
  Administered 2011-08-16: 3 [IU] via SUBCUTANEOUS
  Administered 2011-08-16: 2 [IU] via SUBCUTANEOUS
  Administered 2011-08-17: 3 [IU] via SUBCUTANEOUS
  Administered 2011-08-17: 2 [IU] via SUBCUTANEOUS
  Administered 2011-08-17 – 2011-08-19 (×4): 3 [IU] via SUBCUTANEOUS
  Administered 2011-08-19: 5 [IU] via SUBCUTANEOUS
  Administered 2011-08-19: 2 [IU] via SUBCUTANEOUS
  Administered 2011-08-20: 3 [IU] via SUBCUTANEOUS
  Administered 2011-08-20: 2 [IU] via SUBCUTANEOUS
  Administered 2011-08-20: 5 [IU] via SUBCUTANEOUS
  Filled 2011-08-13 (×2): qty 3

## 2011-08-13 MED ORDER — ACETAMINOPHEN 325 MG PO TABS
650.0000 mg | ORAL_TABLET | Freq: Four times a day (QID) | ORAL | Status: DC | PRN
  Administered 2011-08-16: 650 mg via ORAL
  Filled 2011-08-13: qty 2

## 2011-08-13 MED ORDER — LIDOCAINE 5 % EX PTCH
1.0000 | MEDICATED_PATCH | CUTANEOUS | Status: DC
  Administered 2011-08-14 – 2011-08-20 (×7): 1 via TRANSDERMAL
  Filled 2011-08-13 (×9): qty 1

## 2011-08-13 MED ORDER — DEXTROSE 5 % IV SOLN
1.0000 g | Freq: Every day | INTRAVENOUS | Status: DC
  Administered 2011-08-14 – 2011-08-19 (×6): 1 g via INTRAVENOUS
  Filled 2011-08-13 (×8): qty 10

## 2011-08-13 MED ORDER — MORPHINE SULFATE 2 MG/ML IJ SOLN
1.0000 mg | Freq: Four times a day (QID) | INTRAMUSCULAR | Status: DC | PRN
  Administered 2011-08-14 – 2011-08-19 (×12): 1 mg via INTRAVENOUS
  Filled 2011-08-13 (×12): qty 1

## 2011-08-13 MED ORDER — DEXTROSE 5 % IV SOLN
1.0000 g | Freq: Once | INTRAVENOUS | Status: AC
  Administered 2011-08-13: 22:00:00 via INTRAVENOUS
  Filled 2011-08-13: qty 10

## 2011-08-13 MED ORDER — TAMSULOSIN HCL 0.4 MG PO CAPS
0.4000 mg | ORAL_CAPSULE | Freq: Every day | ORAL | Status: DC
  Administered 2011-08-14 – 2011-08-19 (×6): 0.4 mg via ORAL
  Filled 2011-08-13 (×8): qty 1

## 2011-08-13 MED ORDER — ACETAMINOPHEN 650 MG RE SUPP: 650.0000 mg | Freq: Four times a day (QID) | RECTAL | Status: DC | PRN

## 2011-08-13 MED ORDER — ALBUTEROL SULFATE (5 MG/ML) 0.5% IN NEBU: 2.5000 mg | INHALATION_SOLUTION | Freq: Four times a day (QID) | RESPIRATORY_TRACT | Status: DC | PRN

## 2011-08-13 MED ORDER — ROSUVASTATIN CALCIUM 20 MG PO TABS
20.0000 mg | ORAL_TABLET | Freq: Every day | ORAL | Status: DC
  Administered 2011-08-14 – 2011-08-20 (×7): 20 mg via ORAL
  Filled 2011-08-13 (×8): qty 1

## 2011-08-13 MED ORDER — SODIUM CHLORIDE 0.9 % IV BOLUS (SEPSIS)
250.0000 mL | Freq: Once | INTRAVENOUS | Status: AC
  Administered 2011-08-13: 250 mL via INTRAVENOUS

## 2011-08-13 MED ORDER — SENNA 8.6 MG PO TABS
1.0000 | ORAL_TABLET | Freq: Two times a day (BID) | ORAL | Status: DC
  Administered 2011-08-14 – 2011-08-20 (×11): 8.6 mg via ORAL
  Filled 2011-08-13 (×11): qty 1

## 2011-08-13 MED ORDER — DOCUSATE SODIUM 100 MG PO CAPS
100.0000 mg | ORAL_CAPSULE | Freq: Two times a day (BID) | ORAL | Status: DC
Start: 2011-08-13 — End: 2011-08-20
  Administered 2011-08-14 – 2011-08-20 (×12): 100 mg via ORAL
  Filled 2011-08-13 (×17): qty 1

## 2011-08-13 MED ORDER — ONDANSETRON HCL 4 MG/2ML IJ SOLN
4.0000 mg | Freq: Four times a day (QID) | INTRAMUSCULAR | Status: DC | PRN
  Administered 2011-08-15: 4 mg via INTRAVENOUS
  Filled 2011-08-13: qty 2

## 2011-08-13 MED ORDER — NIACIN ER (ANTIHYPERLIPIDEMIC) 500 MG PO TBCR
1000.0000 mg | EXTENDED_RELEASE_TABLET | Freq: Two times a day (BID) | ORAL | Status: DC
  Administered 2011-08-14 – 2011-08-18 (×10): 1000 mg via ORAL
  Filled 2011-08-13 (×14): qty 2

## 2011-08-13 MED ORDER — SODIUM CHLORIDE 0.9 % IV SOLN
INTRAVENOUS | Status: DC
  Administered 2011-08-13 – 2011-08-14 (×2): via INTRAVENOUS
  Administered 2011-08-14: 125 mL/h via INTRAVENOUS
  Administered 2011-08-14: 1000 mL via INTRAVENOUS
  Administered 2011-08-15 – 2011-08-19 (×5): via INTRAVENOUS

## 2011-08-13 MED ORDER — SODIUM CHLORIDE 0.9 % IV BOLUS (SEPSIS): 250.0000 mL | Freq: Once | INTRAVENOUS | Status: DC

## 2011-08-13 NOTE — ED Notes (Signed)
Attempted in and out cath. Tube kept curling up, will try again in about 10 mins

## 2011-08-13 NOTE — Progress Notes (Signed)
*  PRELIMINARY RESULTS* Vascular Ultrasound Bilateral lower extremity venous duplex has been completed.  Preliminary findings: Right leg negative for DVT  Extensive acute deep and superficial vein thrombosis of the left lower extremity.  The thrombosis extends from the distal calf up into the common femoral vein, (this is as proximal as we image).  RN notified of results @ 1700.  Roy Hickman 08/13/2011, 5:04 PM

## 2011-08-13 NOTE — ED Provider Notes (Signed)
History     CSN: [id number]  Arrival date & time 08/13/11  1410   First MD Initiated Contact with Patient 08/13/11 1513      Chief Complaint  Patient presents with  . Abnormal Lab  . Urinary Tract Infection    The history is provided by the nursing home, the EMS personnel and the patient. History Limited By: poor historian.  Pt was seen at 1520.  Per EMS, NH report and pt, c/o gradual onset and worsening of persistent generalized weakness, decreased appetite, and intermittent confusion for the past week.  NH states pt's "WBC is elevated" and he is being tx for "UTI."  Pt himself denies abd pain, no back pain, no CP/SOB, no cough.    PMD:  Dr. Benedetto Goad Past Medical History  Diagnosis Date  . Hypertension   . Diabetes mellitus   . MI (mitral incompetence)   . Myocardial infarction 2007  . Coronary artery disease   . GERD (gastroesophageal reflux disease)     Past Surgical History  Procedure Date  . Elbow surgery     left  . Laminectomy   . Cervical spine surgery   . Coronary artery bypass graft   . Back surgery   . Posterior cervical fusion/foraminotomy 07/21/2011    Procedure: POSTERIOR CERVICAL FUSION/FORAMINOTOMY LEVEL 5;  Surgeon: Karn Cassis, MD;  Location: [facility];  Service: Neurosurgery;  Laterality: N/A;  Cervical three to Thoracic one Laminectomy, posterolateral arthrodesis, with Lateral Mass screws    Family History  Problem Relation Age of Onset  . Heart failure Father   . Heart failure Sister     History  Substance Use Topics  . Smoking status: Former Smoker -- 25 years    Types: Cigarettes    Quit date: 02/27/1987  . Smokeless tobacco: Never Used  . Alcohol Use: No     quit drinking in 1984      Review of Systems  Unable to perform ROS: Other    Allergies  Review of patient's allergies indicates no known allergies.  Home Medications   Current Outpatient Rx  Name Route Sig Dispense Refill  . ATORVASTATIN CALCIUM 40 MG PO TABS  Oral Take 40 mg by mouth at bedtime.     Marland Kitchen CARVEDILOL 12.5 MG PO TABS Oral Take 12.5 mg by mouth 2 (two) times daily with a meal.     . CIPROFLOXACIN HCL 500 MG PO TABS Oral Take 500 mg by mouth 2 (two) times daily.    Marland Kitchen CLOPIDOGREL BISULFATE 75 MG PO TABS Oral Take 75 mg by mouth daily with breakfast.    . CYCLOBENZAPRINE HCL 10 MG PO TABS Oral Take 10 mg by mouth 3 (three) times daily as needed. For muscle spasm.    Marland Kitchen FOLIC ACID 1 MG PO TABS Oral Take 1 mg by mouth daily.     Marland Kitchen GLIPIZIDE ER 5 MG PO TB24 Oral Take 5 mg by mouth daily.     Marland Kitchen HYDROXYZINE HCL 50 MG PO TABS Oral Take 50 mg by mouth every 6 (six) hours as needed. For itching.    . IBUPROFEN 200 MG PO TABS Oral Take 200 mg by mouth 2 (two) times daily. Scheduled      . INSULIN DETEMIR 100 UNIT/ML [place] SOLN Subcutaneous Inject 15 Units into the skin at bedtime.    Marland Kitchen LIDOCAINE 5 % EX PTCH Transdermal Place 1 patch onto the skin See admin instructions. Apply to affected area on 12 hours  and off 12 hours. Apply patch at 0800 and remove at 2000 pm.    . LISINOPRIL 10 MG PO TABS Oral Take 10 mg by mouth daily.     Marland Kitchen LORATADINE 10 MG PO TABS Oral Take 10 mg by mouth daily.    Marland Kitchen METFORMIN HCL 1000 MG PO TABS Oral Take 1,000 mg by mouth 2 (two) times daily with a meal.    . NIACIN ER (ANTIHYPERLIPIDEMIC) 1000 MG PO TBCR Oral Take 1,000 mg by mouth 2 (two) times daily.    Marland Kitchen FISH OIL 500 MG PO CAPS Oral Take 500 mg by mouth 3 (three) times daily.    . OXYCODONE-ACETAMINOPHEN 7.5-325 MG PO TABS Oral Take 1 tablet by mouth every 8 (eight) hours as needed. For pain.    Marland Kitchen PANTOPRAZOLE SODIUM 20 MG PO TBEC Oral Take 20 mg by mouth daily as needed. For indigestion    . RANITIDINE HCL 300 MG PO TABS Oral Take 300 mg by mouth 2 (two) times daily.     Marland Kitchen TAMSULOSIN HCL 0.4 MG PO CAPS Oral Take 0.4 mg by mouth at bedtime.     . ALPRAZOLAM 1 MG PO TABS Oral Take 1 mg by mouth 2 (two) times daily as needed. For anxiety.      BP 90/50  Pulse 102   Temp(Src) 97.6 F (36.4 C) (Rectal)  Resp 14  SpO2 99%  Physical Exam 1525: Physical examination:  Nursing notes reviewed; Vital signs and O2 SAT reviewed;  Constitutional: Well developed, Well nourished, In no acute distress; Head:  Normocephalic, atraumatic; Eyes: EOMI, PERRL, No scleral icterus; ENMT: Mouth and pharynx normal, Mucous membranes dry; Neck: In rigid cervical collar, trachea midline; Cardiovascular: Regular rate and rhythm, No murmur or gallop; Respiratory: Breath sounds clear & equal bilaterally, No wheezes, Normal respiratory effort/excursion; Chest: Nontender, Movement normal; Abdomen: Soft, Nontender, Nondistended, Normal bowel sounds; Extremities: Pulses normal, No tenderness, +LLE with 1-2+ edema from foot to thigh with calf asymmetry.; Neuro: AA&Ox3, poor historian, Major CN grossly intact. Speech clear, no facial droop.  Moves all ext on stretcher.; Skin: Color normal, Warm, Dry.   ED Course  Procedures   MDM  MDM Reviewed: nursing note, vitals and previous chart Reviewed previous: ECG Interpretation: labs, ECG, x-ray and CT scan    Date: 08/13/2011  Rate: 107  Rhythm: sinus tachycardia  QRS Axis: normal  Intervals: normal  ST/T Wave abnormalities: normal  Conduction Disutrbances:none  Narrative Interpretation:   Old EKG Reviewed: unchanged; no significant changes from previous EKG dated 06/22/2011.  Results for orders placed during the hospital encounter of 08/13/11  URINALYSIS, ROUTINE W REFLEX MICROSCOPIC      Component Value Range   Color, Urine RED (*) YELLOW    APPearance CLOUDY (*) CLEAR    Specific Gravity, Urine 1.033 (*) 1.005 - 1.030    pH 5.0  5.0 - 8.0    Glucose, UA NEGATIVE  NEGATIVE (mg/dL)   Hgb urine dipstick NEGATIVE  NEGATIVE    Bilirubin Urine MODERATE (*) NEGATIVE    Ketones, ur 15 (*) NEGATIVE (mg/dL)   Protein, ur NEGATIVE  NEGATIVE (mg/dL)   Urobilinogen, UA 2.0 (*) 0.0 - 1.0 (mg/dL)   Nitrite POSITIVE (*) NEGATIVE     Leukocytes, UA MODERATE (*) NEGATIVE   BASIC METABOLIC PANEL      Component Value Range   Sodium 135  135 - 145 (mEq/L)   Potassium 4.7  3.5 - 5.1 (mEq/L)   Chloride 99  96 -  112 (mEq/L)   CO2 19  19 - 32 (mEq/L)   Glucose, Bld 231 (*) 70 - 99 (mg/dL)   BUN 43 (*) 6 - 23 (mg/dL)   Creatinine, Ser 6.57 (*) 0.50 - 1.35 (mg/dL)   Calcium 9.8  8.4 - 84.6 (mg/dL)   GFR calc non Af Amer 25 (*) >90 (mL/min)   GFR calc Af Amer 29 (*) >90 (mL/min)  CBC      Component Value Range   WBC 16.0 (*) 4.0 - 10.5 (K/uL)   RBC 2.92 (*) 4.22 - 5.81 (MIL/uL)   Hemoglobin 9.5 (*) 13.0 - 17.0 (g/dL)   HCT 96.2 (*) 95.2 - 52.0 (%)   MCV 96.2  78.0 - 100.0 (fL)   MCH 32.5  26.0 - 34.0 (pg)   MCHC 33.8  30.0 - 36.0 (g/dL)   RDW 84.1  32.4 - 40.1 (%)   Platelets 356  150 - 400 (K/uL)  DIFFERENTIAL      Component Value Range   Neutrophils Relative 83 (*) 43 - 77 (%)   Lymphocytes Relative 7 (*) 12 - 46 (%)   Monocytes Relative 9  3 - 12 (%)   Eosinophils Relative 1  0 - 5 (%)   Basophils Relative 0  0 - 1 (%)   Neutro Abs 13.3 (*) 1.7 - 7.7 (K/uL)   Lymphs Abs 1.1  0.7 - 4.0 (K/uL)   Monocytes Absolute 1.4 (*) 0.1 - 1.0 (K/uL)   Eosinophils Absolute 0.2  0.0 - 0.7 (K/uL)   Basophils Absolute 0.0  0.0 - 0.1 (K/uL)   WBC Morphology MILD LEFT SHIFT (1-5% METAS, OCC MYELO, OCC BANDS)    LACTIC ACID, PLASMA      Component Value Range   Lactic Acid, Venous 1.5  0.5 - 2.2 (mmol/L)  POCT I-STAT TROPONIN I      Component Value Range   Troponin i, poc 0.00  0.00 - 0.08 (ng/mL)   Comment 3           URINE MICROSCOPIC-ADD ON      Component Value Range   Squamous Epithelial / LPF RARE  RARE    WBC, UA 3-6  <3 (WBC/hpf)   Bacteria, UA FEW (*) RARE    Dg Chest 2 View 08/13/2011  *RADIOLOGY REPORT*  Clinical Data: Coronary artery disease, diabetes  CHEST - 2 VIEW  Comparison: Chest x-ray of 06/22/2011  Findings: No active infiltrate or effusion is seen.  There is a poorly defined nodular opacity at the left  lung base near the costophrenic angle which was present on chest x-ray of 2011 and therefore most likely is benign.  Mediastinal contours are stable. Median sternotomy sutures are noted from prior CABG.  Hardware for posterior and anterior fusion of the cervical spine is noted.  The heart is within normal in size.  The bones are diffusely osteopenic with degenerative change.  IMPRESSION: No active lung disease.  Original Report Authenticated By: Juline Patch, M.D.   Ct Head Wo Contrast 08/13/2011  *RADIOLOGY REPORT*  Clinical Data: Elevated white blood cell count.  Effusion.  Fall.  CT HEAD WITHOUT CONTRAST  Technique:  Contiguous axial images were obtained from the base of the skull through the vertex without contrast.  Comparison: Head CT 05/11/2011  Findings:  No acute intracranial hemorrhage.  No focal mass lesion. No CT evidence of acute infarction.   No midline shift or mass effect.  No hydrocephalus.  Basilar cisterns are patent.  Generalized cortical atrophy  unchanged from prior.  Periventricular and subcortical white matter hypodensities which are also unchanged from prior.  Surgical site in the posterior neck consistent recent cervical fusion.  No fluid collection evident.  IMPRESSION: 1.  No acute intracranial findings. 2.  Atrophy and microvascular disease are not changed from prior. 3.  Surgical site not fully imaged but does not appear complicated.  Original Report Authenticated By: Genevive Bi, M.D.   *PRELIMINARY RESULTS*  Vascular Ultrasound  Bilateral lower extremity venous duplex has been completed. Preliminary findings: Right leg negative for DVT Extensive acute deep and superficial vein thrombosis of the left lower extremity. The thrombosis extends from the distal calf up into the common femoral vein, (this is as proximal as we image). RN notified of results @ 1700.  Vanna Scotland  08/13/2011, 5:04 PM    Results for LEVY, CEDANO (MRN [id number]) as of 08/13/2011 19:37  Ref.  Range 07/21/2011 08:52 08/13/2011 16:20  BUN Latest Range: 6-23 mg/dL 14 43 (H)  Creat Latest Range: 0.50-1.35 mg/dL 0.86 5.78 (H)    4696:  BUN/Cr elevated from baseline.  +UTI, UC pending, will order rocephin IV.  SBP increased to 110's from 90's after IVF bolus.  Dx testing d/w pt and family.  Questions answered.  Verb understanding, agreeable to admit. T/C to [name] [name], case discussed, including:  HPI, pertinent PM/SHx, VS/PE, dx testing, ED course and treatment.  Agreeable to admit.  Requests to obtain step down bed to team 5.  He will order anticoagulation for LLE DVT.       Maha Fischel Allison Quarry, DO 08/14/11 [phone]

## 2011-08-13 NOTE — ED Notes (Signed)
RUE:[id number]<JW> Expected date:<BR> Expected time: 1:57 PM<BR> Means of arrival:<BR> Comments:<BR> M41 - 70yoM WBC elevated, UTI hx, confusion last few days

## 2011-08-13 NOTE — Plan of Care (Signed)
Problem: Consults Goal: General Medical Patient Education See Patient Education Module for specific education. Outcome: Progressing Urosepsis and DVT

## 2011-08-13 NOTE — H&P (Signed)
PCP:   [name], PA, PA  Confimred with pt Dr. [name] in [facility]   Chief Complaint:  Confusion, failure to thrive, UTI at SNF  HPI: 70yoM with h/o CAD s/p CABG, DM, prior neck and back surgeries, and recent 07/2011 posterior  decompression/fusion presents with likely SIRS/urinary source, possibly evolving urosepsis,  ARF, extensive LLE DVT, worsened anemia.   Pt was last admitted by [name] in 05/2010, and set up for outpt cervical myelogram, which showed  severe stenosis from C3-C7 and T1, and so admitted by Dr. [name] on 07/21/2011 for posterior  decompression and fusion. He was discharged 1/22 to a SNF. Two sisters state he was discharged  to [facility] and has not done well there. Has been talking out of his head, not eating  drinking, falling out of his wheelchair and falling off the bed or chair. Left the hospital  with a Foley and was left in a few weeks, until just a few days ago when it was taken out and  he had problems urinating, so he was in and out cathed. He feels like he has to pee all the  time, but cannot get anything out. At [facility], they noted he had a UTI and had treated him  with Bactrim, but then changed to Cipro jsut a few days ago because they thought the infection  was "more serious."  In the ED, he was initially hypotensive to 90/50 but this improved quickly with IVF's to  114/51, pulse 102-111, afebrile. Labs with ARF 43/2.45, glucose 231. Lactate 1.5. WBC 16.0 with  mild left shift. Hct 28.1. UA positive for infection with mod LE/pos nitrite, but 3-6 WBC and  few bacteria. UCx pending. Trop POC negative. CXR nothing active. CT head nothing active, and  the part of surgical site visualized appeared normal, but not fully imaged. Pt also noted to  have gross LLE swelling, and doppler confirmed LLE DVT in common femoral, femoral, profunda  femorral, popliteal, posterior tibial, peroneal, saphenofemoral jxn, greater saphenous, lesser  saphenous.     No known fevers, chills, sweats, feeling systemically ill. No cardiopulmonary symptoms, no GI  symptoms. Neck has been doing well and there's no complaint on that end. When told abt the DVT,  neither he nor the sisters had noticed the leg is swollen. Pt has not been walking much. ROS otherwise unremarkable.   Past Medical History  Diagnosis Date  . Hypertension   . Diabetes mellitus   . MI (mitral incompetence)   . Myocardial infarction 2007    s/p CABG   . Coronary artery disease   . GERD (gastroesophageal reflux disease)     Past Surgical History  Procedure Date  . Elbow surgery     left  . Laminectomy   . Cervical spine surgery   . Coronary artery bypass graft 2008  . Back surgery   . Posterior cervical fusion/foraminotomy 07/21/2011    Procedure: POSTERIOR CERVICAL FUSION/FORAMINOTOMY LEVEL 5;  Surgeon: Karn Cassis, MD;  Location: [facility];  Service: Neurosurgery;  Laterality: N/A;  Cervical three to Thoracic one Laminectomy, posterolateral arthrodesis, with Lateral Mass screws    Medications:  HOME MEDS: Reconciled fairly by pt and family, not 100% accurate though. List entered by pharm tech from SNF's list Prior to Admission medications   Medication Sig Start Date End Date Taking? Authorizing Provider  atorvastatin (LIPITOR) 40 MG tablet Take 40 mg by mouth at bedtime.    Yes Historical Provider, MD  carvedilol (  COREG) 12.5 MG tablet Take 12.5 mg by mouth 2 (two) times daily with a meal.    Yes Historical Provider, MD  ciprofloxacin (CIPRO) 500 MG tablet Take 500 mg by mouth 2 (two) times daily. 08/10/11 08/19/11 Yes Historical Provider, MD  clopidogrel (PLAVIX) 75 MG tablet Take 75 mg by mouth daily with breakfast.   Yes Historical Provider, MD  cyclobenzaprine (FLEXERIL) 10 MG tablet Take 10 mg by mouth 3 (three) times daily as needed. For muscle spasm.   Yes Historical Provider, MD  folic acid ([name]) 1 MG tablet Take 1 mg by mouth daily.    Yes Historical  Provider, MD  glipiZIDE ([name]) 5 MG 24 hr tablet Take 5 mg by mouth daily.    Yes Historical Provider, MD  hydrOXYzine ([name]) 50 MG tablet Take 50 mg by mouth every 6 (six) hours as needed. For itching.   Yes Historical Provider, MD  ibuprofen (ADVIL,MOTRIN) 200 MG tablet Take 200 mg by mouth 2 (two) times daily. Scheduled     Yes Historical Provider, MD  insulin detemir ([name]) 100 UNIT/ML injection Inject 15 Units into the skin at bedtime.   Yes Historical Provider, MD  lidocaine ([name]) 5 % Place 1 patch onto the skin See admin instructions. Apply to affected area on 12 hours and off 12 hours. Apply patch at 0800 and remove at 2000 pm. 05/14/11  Yes Jeoffrey Massed, MD  lisinopril ([name],[name]) 10 MG tablet Take 10 mg by mouth daily.    Yes Historical Provider, MD  loratadine (CLARITIN) 10 MG tablet Take 10 mg by mouth daily.   Yes Historical Provider, MD  metFORMIN ([name]) 1000 MG tablet Take 1,000 mg by mouth 2 (two) times daily with a meal. 05/14/11  Yes Jeoffrey Massed, MD  niacin ([name]) 1000 MG CR tablet Take 1,000 mg by mouth 2 (two) times daily. 05/14/11  Yes Jeoffrey Massed, MD  Omega-3 Fatty Acids (FISH OIL) 500 MG CAPS Take 500 mg by mouth 3 (three) times daily.   Yes Historical Provider, MD  oxyCODONE-acetaminophen (PERCOCET) 7.5-325 MG per tablet Take 1 tablet by mouth every 8 (eight) hours as needed. For pain. 05/14/11  Yes Shanker Ghimire, MD  pantoprazole (PROTONIX) 20 MG tablet Take 20 mg by mouth daily as needed. For indigestion   Yes Historical Provider, MD  ranitidine ([name]) 300 MG tablet Take 300 mg by mouth 2 (two) times daily.    Yes Historical Provider, MD  Tamsulosin HCl (FLOMAX) 0.4 MG CAPS Take 0.4 mg by mouth at bedtime.    Yes Historical Provider, MD  ALPRAZolam [name]) 1 MG tablet Take 1 mg by mouth 2 (two) times daily as needed. For anxiety. 05/14/11   Jeoffrey Massed, MD    Allergies:  No Known Allergies  Social History:    reports that he quit smoking about 24 years ago. His smoking use included Cigarettes. He has a 50 pack-year smoking history. He has never used smokeless tobacco. He reports that he does not drink alcohol or use illicit drugs. Has been at [facility] [place]. Was living in an apt by himself before being at [facility] since 05/2010. Has 5 sisters, no brothers. [phone] or [phone]. [name], [employer] Sizemore.  Family History: Family History  Problem Relation Age of Onset  . Heart failure Father   . Heart failure Sister     Physical Exam: Filed Vitals:   08/13/11 1423 08/13/11 1632 08/13/11 1714 08/13/11 2105  BP: 90/50  114/51 120/63  Pulse: 102  111 105  Temp: 97.5 F (36.4 C) 97.6 F (36.4 C)  98.4 F (36.9 C)  TempSrc:  Rectal  Rectal  Resp: 14  14 20   SpO2: 99%  97% 99%   Blood pressure 120/63, pulse 105, temperature 98.4 F (36.9 C), temperature source Rectal, resp. rate 20, SpO2 99.00%.  Gen: Elderly M in neck brace, in ED stretcher, occasionally answers incorrectly but for the  most part is mentally clear and can answer simple questions well. No distress, breathing  comfortably, not in pain HEENT: Pupils round ~3-47mm, reactive, EOMI, sclera/irises clear and normal. Mouth/tongue quite  dry appearing Lungs: Anterolaterally are CTAB no w/c/r, good air movement Heart: regular and not tachycardic, no m/g, normal exam Abd: Soft, scaphoid, non tender, non distended, benign, no facial grimacing Extremities: BLE's are covered with excorations from itching, and LLE is grossly larger than  the right, with soft pitting edema around the shins. Both are warm, well perfused. Radials  palpable Neuro: Alert, attentive, conversant, able to answer appropriately for the most part, able to  follow commands, moves extremities on his own. Requires assistance to roll to the side, but is  able. Rectal: Small, possibly stage 1 on his sacrum, rectal exam normal, prostate minimally  enlarged  but smooth, and stool is guaic negative, brown, normal    Labs & Imaging Results for orders placed during the hospital encounter of 08/13/11 (from the past 48 hour(s))  BASIC METABOLIC PANEL     Status: Abnormal   Collection Time   08/13/11  4:20 PM      Component Value Range Comment   Sodium 135  135 - 145 (mEq/L)    Potassium 4.7  3.5 - 5.1 (mEq/L)    Chloride 99  96 - 112 (mEq/L)    CO2 19  19 - 32 (mEq/L)    Glucose, Bld 231 (*) 70 - 99 (mg/dL)    BUN 43 (*) 6 - 23 (mg/dL)    Creatinine, Ser 1.61 (*) 0.50 - 1.35 (mg/dL)    Calcium 9.8  8.4 - 10.5 (mg/dL)    GFR calc non Af Amer 25 (*) >90 (mL/min)    GFR calc Af Amer 29 (*) >90 (mL/min)   CBC     Status: Abnormal   Collection Time   08/13/11  4:20 PM      Component Value Range Comment   WBC 16.0 (*) 4.0 - 10.5 (K/uL)    RBC 2.92 (*) 4.22 - 5.81 (MIL/uL)    Hemoglobin 9.5 (*) 13.0 - 17.0 (g/dL)    HCT 09.6 (*) 04.5 - 52.0 (%)    MCV 96.2  78.0 - 100.0 (fL)    MCH 32.5  26.0 - 34.0 (pg)    MCHC 33.8  30.0 - 36.0 (g/dL)    RDW 40.9  81.1 - 91.4 (%)    Platelets 356  150 - 400 (K/uL)   DIFFERENTIAL     Status: Abnormal   Collection Time   08/13/11  4:20 PM      Component Value Range Comment   Neutrophils Relative 83 (*) 43 - 77 (%)    Lymphocytes Relative 7 (*) 12 - 46 (%)    Monocytes Relative 9  3 - 12 (%)    Eosinophils Relative 1  0 - 5 (%)    Basophils Relative 0  0 - 1 (%)    Neutro Abs 13.3 (*) 1.7 - 7.7 (K/uL)    Lymphs Abs 1.1  0.7 -  4.0 (K/uL)    Monocytes Absolute 1.4 (*) 0.1 - 1.0 (K/uL)    Eosinophils Absolute 0.2  0.0 - 0.7 (K/uL)    Basophils Absolute 0.0  0.0 - 0.1 (K/uL)    WBC Morphology MILD LEFT SHIFT (1-5% METAS, OCC MYELO, OCC BANDS)     PROCALCITONIN     Status: Normal   Collection Time   08/13/11  4:20 PM      Component Value Range Comment   Procalcitonin 0.30     POCT I-STAT TROPONIN I     Status: Normal   Collection Time   08/13/11  4:42 PM      Component Value Range Comment   Troponin  i, poc 0.00  0.00 - 0.08 (ng/mL)    Comment 3            LACTIC ACID, PLASMA     Status: Normal   Collection Time   08/13/11  4:47 PM      Component Value Range Comment   Lactic Acid, Venous 1.5  0.5 - 2.2 (mmol/L)   URINALYSIS, ROUTINE W REFLEX MICROSCOPIC     Status: Abnormal   Collection Time   08/13/11  5:13 PM      Component Value Range Comment   Color, Urine RED (*) YELLOW  BIOCHEMICALS MAY BE AFFECTED BY COLOR   APPearance CLOUDY (*) CLEAR     Specific Gravity, Urine 1.033 (*) 1.005 - 1.030     pH 5.0  5.0 - 8.0     Glucose, UA NEGATIVE  NEGATIVE (mg/dL)    Hgb urine dipstick NEGATIVE  NEGATIVE     Bilirubin Urine MODERATE (*) NEGATIVE     Ketones, ur 15 (*) NEGATIVE (mg/dL)    Protein, ur NEGATIVE  NEGATIVE (mg/dL)    Urobilinogen, UA 2.0 (*) 0.0 - 1.0 (mg/dL)    Nitrite POSITIVE (*) NEGATIVE     Leukocytes, UA MODERATE (*) NEGATIVE    URINE MICROSCOPIC-ADD ON     Status: Abnormal   Collection Time   08/13/11  5:13 PM      Component Value Range Comment   Squamous Epithelial / LPF RARE  RARE     WBC, UA 3-6  <3 (WBC/hpf)    Bacteria, UA FEW (*) RARE    OCCULT BLOOD, POC DEVICE     Status: Normal   Collection Time   08/13/11  8:31 PM      Component Value Range Comment   Fecal Occult Bld NEGATIVE      Dg Chest 2 View  08/13/2011  *RADIOLOGY REPORT*  Clinical Data: Coronary artery disease, diabetes  CHEST - 2 VIEW  Comparison: Chest x-ray of 06/22/2011  Findings: No active infiltrate or effusion is seen.  There is a poorly defined nodular opacity at the left lung base near the costophrenic angle which was present on chest x-ray of 2011 and therefore most likely is benign.  Mediastinal contours are stable. Median sternotomy sutures are noted from prior CABG.  Hardware for posterior and anterior fusion of the cervical spine is noted.  The heart is within normal in size.  The bones are diffusely osteopenic with degenerative change.  IMPRESSION: No active lung disease.  Original Report  Authenticated By: Juline Patch, M.D.   Ct Head Wo Contrast  08/13/2011  *RADIOLOGY REPORT*  Clinical Data: Elevated white blood cell count.  Effusion.  Fall.  CT HEAD WITHOUT CONTRAST  Technique:  Contiguous axial images were obtained from the base of the skull  through the vertex without contrast.  Comparison: Head CT 05/11/2011  Findings:  No acute intracranial hemorrhage.  No focal mass lesion. No CT evidence of acute infarction.   No midline shift or mass effect.  No hydrocephalus.  Basilar cisterns are patent.  Generalized cortical atrophy unchanged from prior.  Periventricular and subcortical white matter hypodensities which are also unchanged from prior.  Surgical site in the posterior neck consistent recent cervical fusion.  No fluid collection evident.  IMPRESSION: 1.  No acute intracranial findings. 2.  Atrophy and microvascular disease are not changed from prior. 3.  Surgical site not fully imaged but does not appear complicated.  Original Report Authenticated By: Genevive Bi, M.D.    Impression Present on Admission:  .Urosepsis .Acute renal failure .DVT of leg (deep venous thrombosis) .Anemia .CAD (coronary artery disease) .DM2 (diabetes mellitus, type 2)  70yoM with h/o CAD s/p CABG, DM, prior neck and back surgeries, and recent 07/2011 posterior  decompression/fusion presents with likely SIRS/urinary source, possibly evolving urosepsis,  ARF, extensive LLE DVT, worsened anemia.   1. Urosepsis: Has SIRS criteria of tachycardia and leukocytosis, and minimally hypotense on  arrival that quickly reversed, but is in ARF. Urinary seems likely source. We have no prior  urine cultures for comparison, but pt apparently failed outpt bactrim already per report.   - Continue Ceftriaxone and follow up UCx, continue IVF's  - Holding coreg, lisinopril   2. Confusion: Likely subacute infections/toxic metabolic encephalopathy, but sisters also  requesting to stop xanax that was being given  at SNF, which I've not ordered here. Seems mild  at present, would just monitor.   3. ARF: Likely pre-renal and due to SIRS/sepsis, will give IVF's and trend BMET.   4. DVT: Extensive left leg DVT, likely due to poor mobility at SNF and recent surgery. I have  had risk / benefit conversation with family and pt and they elect for anticoagulation. Would  see if he could get Rivaroxaban on discharge but will start with Lovenox. Also, guaic is  negative (has new anemia). No current evidence of pulmonary embolism.  - Have spoken to pharmacy and will start Lovenox once daily despite renal fxn, have asked them  to help monitor and adjust   5. Anemia: Hct now 28, but this is down from 32.8 presumably before the surgery since this was  on 1/16, so this could have been peripoperative blood loss. Guaic negative as above. Anemia  panel with am labs and just monitor for now.   6. Hyperglycemia: will continue his detemir and give moderate SSI   7. ? urinary retention: Pt states he feels like he has to pee but can't which could be BPH type  symptoms, or feeling of urgency from a UTI.  - continue flomax, get bladder scan, leave foley out for now, voiding trials  8. CAD s/p CABG: continue plavix, statin, niacin. Holding coreg     9. Holding all other various non essential medications    SDU, [facility] team 5 Full code as discussed with family/pt  Other plans as per orders.  Carlota Raspberry, MD 08/13/2011 931pm

## 2011-08-13 NOTE — ED Notes (Signed)
Per EMS.  Pt from [facility].  Pt has elevated WBC, number not noted on paperwork.  Staff also reports that he has had a UTI for the past week with increased confusion and falls.  Pt also has decreased appetite and staff reports he has not eaten today.  Per EMS pt is alert and oriented.  Pt has neck brace for surgery in January.

## 2011-08-14 LAB — VITAMIN B12: Vitamin B-12: 292 pg/mL (ref 211–911)

## 2011-08-14 LAB — FERRITIN: Ferritin: 488 ng/mL — ABNORMAL HIGH (ref 22–322)

## 2011-08-14 LAB — CBC
MCH: 32 pg (ref 26.0–34.0)
Platelets: 375 10*3/uL (ref 150–400)
RBC: 2.91 MIL/uL — ABNORMAL LOW (ref 4.22–5.81)
WBC: 15.5 10*3/uL — ABNORMAL HIGH (ref 4.0–10.5)

## 2011-08-14 LAB — COMPREHENSIVE METABOLIC PANEL
AST: 15 U/L (ref 0–37)
BUN: 41 mg/dL — ABNORMAL HIGH (ref 6–23)
CO2: 19 mEq/L (ref 19–32)
Chloride: 100 mEq/L (ref 96–112)
Creatinine, Ser: 2.39 mg/dL — ABNORMAL HIGH (ref 0.50–1.35)
GFR calc non Af Amer: 25 mL/min — ABNORMAL LOW (ref >90)
Glucose, Bld: 206 mg/dL — ABNORMAL HIGH (ref 70–99)
Total Bilirubin: 0.3 mg/dL (ref 0.3–1.2)

## 2011-08-14 LAB — GLUCOSE, CAPILLARY: Glucose-Capillary: 236 mg/dL — ABNORMAL HIGH (ref 70–99)

## 2011-08-14 LAB — IRON AND TIBC
Saturation Ratios: 34 % (ref 20–55)
TIBC: 183 ug/dL — ABNORMAL LOW (ref 215–435)

## 2011-08-14 LAB — RETICULOCYTES
RBC.: 2.91 MIL/uL — ABNORMAL LOW (ref 4.22–5.81)
Retic Ct Pct: 1.1 % (ref 0.4–3.1)

## 2011-08-14 MED ORDER — INSULIN DETEMIR 100 UNIT/ML ~~LOC~~ SOLN
15.0000 [IU] | Freq: Every day | SUBCUTANEOUS | Status: DC
  Administered 2011-08-14 – 2011-08-19 (×6): 15 [IU] via SUBCUTANEOUS
  Filled 2011-08-14: qty 3

## 2011-08-14 NOTE — Progress Notes (Signed)
Pt w/o any urine output noted while on floor. Mid level paged awaiting call back.

## 2011-08-14 NOTE — Progress Notes (Signed)
Subjective: Patient alert and denies any complaints at this a.m., per nursing staff unable to void overnight and in and out catheterization done about 6 AM today. Intermittent confusion noted per nursing.  Objective: Vital signs in last 24 hours: Temp:  [97.5 F (36.4 C)-98.4 F (36.9 C)] 98.4 F (36.9 C) (02/09 0800) Pulse Rate:  [102-111] 107  (02/08 2315) Resp:  [14-20] 17  (02/08 2315) BP: (90-130)/(50-63) 130/53 mmHg (02/09 0842) SpO2:  [97 %-100 %] 100 % (02/08 2315) Weight:  [83.9 kg (184 lb 15.5 oz)] 83.9 kg (184 lb 15.5 oz) (02/08 2315) Last BM Date: 08/13/11 Intake/Output from previous day: 02/08 0701 - 02/09 0700 In: 566.7 [I.V.:566.7] Out: 450 [Urine:450] Intake/Output this shift:      General Appearance:    Alert and oriented x1, cooperative, no distress, appears stated age he answers simple questions appropriately  Neck-has a cervical collar on.   Lungs:     Clear to auscultation bilaterally, respirations unlabored   Heart:    Regular rate and rhythm, S1 and S2 normal, no murmur, rub   or gallop  Abdomen:     Soft, non-tender, bowel sounds active all four quadrants,    no masses, no organomegaly  Extremities:    no cyanosis or edema  Neurologic:   CNII-XII intact, normal strength, nonfocal.      Weight change:   Intake/Output Summary (Last 24 hours) at 08/14/11 0959 Last data filed at 08/14/11 0554  Gross per 24 hour  Intake 566.67 ml  Output    450 ml  Net 116.67 ml    Lab Results:   Basename 08/14/11 0330 08/13/11 1620  NA 134* 135  K 4.4 4.7  CL 100 99  CO2 19 19  GLUCOSE 206* 231*  BUN 41* 43*  CREATININE 2.39* 2.45*  CALCIUM 9.6 9.8    Basename 08/14/11 0330 08/13/11 1620  WBC 15.5* 16.0*  HGB 9.3* 9.5*  HCT 28.1* 28.1*  PLT 375 356  MCV 96.6 96.2   PT/INR No results found for this basename: LABPROT:2,INR:2 in the last 72 hours ABG No results found for this basename: PHART:2,PCO2:2,PO2:2,HCO3:2 in the last 72 hours  Micro  Results: Recent Results (from the past 240 hour(s))  MRSA PCR SCREENING     Status: Normal   Collection Time   08/13/11 11:08 PM      Component Value Range Status Comment   MRSA by PCR NEGATIVE  NEGATIVE  Final    Studies/Results: Dg Chest 2 View  08/13/2011  *RADIOLOGY REPORT*  Clinical Data: Coronary artery disease, diabetes  CHEST - 2 VIEW  Comparison: Chest x-ray of 06/22/2011  Findings: No active infiltrate or effusion is seen.  There is a poorly defined nodular opacity at the left lung base near the costophrenic angle which was present on chest x-ray of 2011 and therefore most likely is benign.  Mediastinal contours are stable. Median sternotomy sutures are noted from prior CABG.  Hardware for posterior and anterior fusion of the cervical spine is noted.  The heart is within normal in size.  The bones are diffusely osteopenic with degenerative change.  IMPRESSION: No active lung disease.  Original Report Authenticated By: Juline Patch, M.D.   Ct Head Wo Contrast  08/13/2011  *RADIOLOGY REPORT*  Clinical Data: Elevated white blood cell count.  Effusion.  Fall.  CT HEAD WITHOUT CONTRAST  Technique:  Contiguous axial images were obtained from the base of the skull through the vertex without contrast.  Comparison: Head CT  05/11/2011  Findings:  No acute intracranial hemorrhage.  No focal mass lesion. No CT evidence of acute infarction.   No midline shift or mass effect.  No hydrocephalus.  Basilar cisterns are patent.  Generalized cortical atrophy unchanged from prior.  Periventricular and subcortical white matter hypodensities which are also unchanged from prior.  Surgical site in the posterior neck consistent recent cervical fusion.  No fluid collection evident.  IMPRESSION: 1.  No acute intracranial findings. 2.  Atrophy and microvascular disease are not changed from prior. 3.  Surgical site not fully imaged but does not appear complicated.  Original Report Authenticated By: Genevive Bi, M.D.    Medications: Scheduled Meds:   . carvedilol  12.5 mg Oral BID WC  . cefTRIAXone (ROCEPHIN)  IV  1 g Intravenous Once  . cefTRIAXone (ROCEPHIN)  IV  1 g Intravenous QHS  . clopidogrel  75 mg Oral Q breakfast  . docusate sodium  100 mg Oral BID  . enoxaparin  1 mg/kg (Adjusted) Subcutaneous QHS  . insulin aspart  0-15 Units Subcutaneous TID WC  . lidocaine  1 patch Transdermal Q24H  . niacin  1,000 mg Oral BID  . rosuvastatin  20 mg Oral Daily  . senna  1 tablet Oral BID  . sodium chloride  250 mL Intravenous Once  . Tamsulosin HCl  0.4 mg Oral QHS  . DISCONTD: sodium chloride  250 mL Intravenous Once   Continuous Infusions:   . sodium chloride 125 mL/hr at 08/14/11 0346  . DISCONTD: sodium chloride 75 mL/hr at 08/13/11 1858   PRN Meds:.acetaminophen, acetaminophen, albuterol, morphine, ondansetron (ZOFRAN) IV, ondansetron Assessment/Plan: Present on Admission:  1. UTI with sepsis syndrome  - Continue Ceftriaxone, no significant change and leukocytosis and follow up UCx and adjust antibiotics as appropriate.,  -continue IVF's  - Holding coreg, lisinopril  2. Confusion - Likely subacute infections/toxic metabolic encephalopathy,  -also xanax discontinued as requested.   3. ARF:  Likely pre-renal and due to SIRS/sepsis -will continue IVF's and trend BMET.   4. DVT  Extensive left leg DVT, likely due to poor mobility at SNF and recent surgery.  - risk / benefit conversation per admitting M.D. with family and pt and they elect for anticoagulation.   -guaic is negative (has new anemia). No current evidence of pulmonary embolism.   5. Anemia:  -Hemoglobin stable this a.m., follow up on anemia panel.  Hct now 28, but this is down from 32.8 presumably before the surgery since this was  on 1/16, so this could have been peripoperative blood loss. Guaic negative as above. Anemia  panel with am labs and just monitor for now.   6. diabetes mellitus, uncontrolled: will continue  his detemir and give moderate SSI   7. ? urinary retention: -Likely secondary to BPH - continue flomax,  -If still not voiding following in and out catheterization Will need Foley catheter placed.  8. CAD s/p CABG:  Chest pain free, continue plavix, statin, niacin. Holding coreg   -Patient medically stable for transfer to telemetry bed.  LOS: 1 day   Sukhraj Esquivias C 08/14/2011, 9:59 AM [phone]

## 2011-08-15 LAB — GLUCOSE, CAPILLARY: Glucose-Capillary: 234 mg/dL — ABNORMAL HIGH (ref 70–99)

## 2011-08-15 LAB — BASIC METABOLIC PANEL
BUN: 30 mg/dL — ABNORMAL HIGH (ref 6–23)
Chloride: 106 mEq/L (ref 96–112)
Creatinine, Ser: 1.3 mg/dL (ref 0.50–1.35)
Glucose, Bld: 220 mg/dL — ABNORMAL HIGH (ref 70–99)
Potassium: 4.6 mEq/L (ref 3.5–5.1)

## 2011-08-15 LAB — CBC
HCT: 27 % — ABNORMAL LOW (ref 39.0–52.0)
Hemoglobin: 9.1 g/dL — ABNORMAL LOW (ref 13.0–17.0)
MCV: 95.4 fL (ref 78.0–100.0)
WBC: 18.2 10*3/uL — ABNORMAL HIGH (ref 4.0–10.5)

## 2011-08-15 LAB — PROTIME-INR: Prothrombin Time: 15.7 seconds — ABNORMAL HIGH (ref 11.6–15.2)

## 2011-08-15 LAB — URINE CULTURE: Culture  Setup Time: 201302090143

## 2011-08-15 MED ORDER — ALPRAZOLAM 0.5 MG PO TABS
0.5000 mg | ORAL_TABLET | Freq: Once | ORAL | Status: AC
  Administered 2011-08-15: 0.5 mg via ORAL
  Filled 2011-08-15: qty 1

## 2011-08-15 MED ORDER — WARFARIN SODIUM 5 MG PO TABS
5.0000 mg | ORAL_TABLET | Freq: Once | ORAL | Status: AC
  Administered 2011-08-15: 5 mg via ORAL
  Filled 2011-08-15 (×2): qty 1

## 2011-08-15 MED ORDER — ENOXAPARIN SODIUM 80 MG/0.8ML ~~LOC~~ SOLN
1.0000 mg/kg | Freq: Two times a day (BID) | SUBCUTANEOUS | Status: AC
  Administered 2011-08-15 – 2011-08-19 (×10): 80 mg via SUBCUTANEOUS
  Filled 2011-08-15 (×10): qty 0.8

## 2011-08-15 MED ORDER — PANTOPRAZOLE SODIUM 40 MG PO TBEC
40.0000 mg | DELAYED_RELEASE_TABLET | Freq: Every morning | ORAL | Status: DC
  Administered 2011-08-15 – 2011-08-20 (×6): 40 mg via ORAL
  Filled 2011-08-15 (×7): qty 1

## 2011-08-15 NOTE — Progress Notes (Signed)
ANTICOAGULATION CONSULT NOTE - Initial Consult  Pharmacy Consult for Lovenox & Coumadin Indication: DVT  No Known Allergies  Patient Measurements: Height: 6' (182.9 cm) Weight: 186 lb 1.1 oz (84.4 kg) IBW/kg (Calculated) : 77.6   Vital Signs: Temp: 99.2 F (37.3 C) (02/10 0800) Temp src: Oral (02/10 0800) BP: 154/77 mmHg (02/10 0908) Pulse Rate: 115  (02/10 0908)  Labs:  Basename 08/15/11 1015 08/15/11 0319 08/14/11 0330 08/13/11 1620  HGB -- 9.1* 9.3* --  HCT -- 27.0* 28.1* 28.1*  PLT -- 351 375 356  APTT -- -- -- --  LABPROT 15.7* -- -- --  INR 1.22 -- -- --  HEPARINUNFRC -- -- -- --  CREATININE -- 1.30 2.39* 2.45*  CKTOTAL -- -- -- --  CKMB -- -- -- --  TROPONINI -- -- -- --   Estimated Creatinine Clearance: 56.4 ml/min (by C-G formula based on Cr of 1.3).  Medical History: Past Medical History  Diagnosis Date  . Hypertension   . Diabetes mellitus   . MI (mitral incompetence)   . Myocardial infarction 2007    s/p CABG   . Coronary artery disease   . GERD (gastroesophageal reflux disease)    Medications:  Scheduled:     . carvedilol  12.5 mg Oral BID WC  . cefTRIAXone (ROCEPHIN)  IV  1 g Intravenous QHS  . clopidogrel  75 mg Oral Q breakfast  . docusate sodium  100 mg Oral BID  . enoxaparin  1 mg/kg (Adjusted) Subcutaneous Q12H  . insulin aspart  0-15 Units Subcutaneous TID WC  . insulin detemir  15 Units Subcutaneous QHS  . lidocaine  1 patch Transdermal Q24H  . niacin  1,000 mg Oral BID  . pantoprazole  40 mg Oral q morning - 10a  . rosuvastatin  20 mg Oral Daily  . senna  1 tablet Oral BID  . Tamsulosin HCl  0.4 mg Oral QHS  . DISCONTD: enoxaparin  1 mg/kg (Adjusted) Subcutaneous QHS   Anti-infectives     Start     Dose/Rate Route Frequency Ordered Stop   08/14/11 2200   cefTRIAXone (ROCEPHIN) 1 g in dextrose 5 % 50 mL IVPB        1 g 100 mL/hr over 30 Minutes Intravenous Daily at bedtime 08/13/11 2313     08/13/11 1930   cefTRIAXone  (ROCEPHIN) 1 g in dextrose 5 % 50 mL IVPB        1 g 100 mL/hr over 30 Minutes Intravenous  Once 08/13/11 1923 08/13/11 2227         Assessment:  70yo M from NH, admitted with confusion, FTT, UTI, ARF, LLE DVT, anemia.  Lovenox 1mg /kg q24h started 08/13/11.  Starting Coumadin today so  today will be overlap day 1 of 5 minimum.  SCr has improved, CrCl now >30.  Recent back surgery.  Hgb has been trending down but no bleeding reported/documented.  Concurrent Plavix is noted.  Goal of Therapy:  INR 2-3   Plan:   Increase Lovenox to 1mg /kg q12h.  Coumadin 5mg  today.  Daily INR.  Reece Packer 08/15/2011,10:50 AM

## 2011-08-15 NOTE — Progress Notes (Signed)
Spoke with Dr Suanne Marker regarding dressing on pt's neck. Dressing was stained with old blood and completely saturated with seemingly new serosanguineous fluid. Ordered given for nurse to change cervical dressing q day, states she will call Dr. Maida Sale (neuro) and make him aware pt is here at [facility].

## 2011-08-15 NOTE — Progress Notes (Signed)
Report called to Elwin Mocha , RN.

## 2011-08-15 NOTE — Progress Notes (Signed)
Subjective: Complaining of reflux symptoms., per nursing staff patient arm was able to void spontaneously of this a.m. Objective: Vital signs in last 24 hours: Temp:  [97.6 F (36.4 C)-99.2 F (37.3 C)] 99.2 F (37.3 C) (02/10 0800) Pulse Rate:  [90-114] 114  (02/10 0335) Resp:  [15-23] 18  (02/10 0335) BP: (103-138)/(47-101) 124/101 mmHg (02/10 0335) SpO2:  [97 %-99 %] 99 % (02/10 0335) Weight:  [84.4 kg (186 lb 1.1 oz)] 84.4 kg (186 lb 1.1 oz) (02/10 0000) Last BM Date: 08/15/11 Intake/Output from previous day: 02/09 0701 - 02/10 0700 In: 3540 [P.O.:490; I.V.:3000; IV Piggyback:50] Out: 1225 [Urine:1225] Intake/Output this shift: Total I/O In: 125 [I.V.:125] Out: -     General Appearance:    Alert and oriented x1, cooperative, no distress, appears stated age he answers simple questions appropriately  Neck-has a cervical collar on.   Lungs:     Clear to auscultation bilaterally, respirations unlabored   Heart:    Regular rate and rhythm, S1 and S2 normal, no murmur, rub   or gallop  Abdomen:     Soft, non-tender, bowel sounds active all four quadrants,    no masses, no organomegaly  Extremities:    no cyanosis or edema  Neurologic:   CNII-XII intact, normal strength, nonfocal.      Weight change: 0.5 kg (1 lb 1.6 oz)  Intake/Output Summary (Last 24 hours) at 08/15/11 0903 Last data filed at 08/15/11 0800  Gross per 24 hour  Intake   3290 ml  Output   1225 ml  Net   2065 ml    Lab Results:   [facility] 08/15/11 0319 08/14/11 0330  NA 137 134*  K 4.6 4.4  CL 106 100  CO2 21 19  GLUCOSE 220* 206*  BUN 30* 41*  CREATININE 1.30 2.39*  CALCIUM 8.9 9.6    Basename 08/15/11 0319 08/14/11 0330  WBC 18.2* 15.5*  HGB 9.1* 9.3*  HCT 27.0* 28.1*  PLT 351 375  MCV 95.4 96.6   PT/INR No results found for this basename: LABPROT:2,INR:2 in the last 72 hours ABG No results found for this basename: PHART:2,PCO2:2,PO2:2,HCO3:2 in the last 72 hours  Micro  Results: Recent Results (from the past 240 hour(s))  URINE CULTURE     Status: Normal   Collection Time   08/13/11  5:13 PM      Component Value Range Status Comment   Specimen Description URINE, CATHETERIZED   Final    Special Requests NONE   Final    Culture  Setup Time [id number]   Final    Colony Count NO GROWTH   Final    Culture NO GROWTH   Final    Report Status 08/15/2011 FINAL   Final   MRSA PCR SCREENING     Status: Normal   Collection Time   08/13/11 11:08 PM      Component Value Range Status Comment   MRSA by PCR NEGATIVE  NEGATIVE  Final    Studies/Results: Dg Chest 2 View  08/13/2011  *RADIOLOGY REPORT*  Clinical Data: Coronary artery disease, diabetes  CHEST - 2 VIEW  Comparison: Chest x-ray of 06/22/2011  Findings: No active infiltrate or effusion is seen.  There is a poorly defined nodular opacity at the left lung base near the costophrenic angle which was present on chest x-ray of 2011 and therefore most likely is benign.  Mediastinal contours are stable. Median sternotomy sutures are noted from prior CABG.  Hardware for posterior and anterior  fusion of the cervical spine is noted.  The heart is within normal in size.  The bones are diffusely osteopenic with degenerative change.  IMPRESSION: No active lung disease.  Original Report Authenticated By: Juline Patch, M.D.   Ct Head Wo Contrast  08/13/2011  *RADIOLOGY REPORT*  Clinical Data: Elevated white blood cell count.  Effusion.  Fall.  CT HEAD WITHOUT CONTRAST  Technique:  Contiguous axial images were obtained from the base of the skull through the vertex without contrast.  Comparison: Head CT 05/11/2011  Findings:  No acute intracranial hemorrhage.  No focal mass lesion. No CT evidence of acute infarction.   No midline shift or mass effect.  No hydrocephalus.  Basilar cisterns are patent.  Generalized cortical atrophy unchanged from prior.  Periventricular and subcortical white matter hypodensities which are also unchanged  from prior.  Surgical site in the posterior neck consistent recent cervical fusion.  No fluid collection evident.  IMPRESSION: 1.  No acute intracranial findings. 2.  Atrophy and microvascular disease are not changed from prior. 3.  Surgical site not fully imaged but does not appear complicated.  Original Report Authenticated By: Genevive Bi, M.D.   Medications: Scheduled Meds:    . carvedilol  12.5 mg Oral BID WC  . cefTRIAXone (ROCEPHIN)  IV  1 g Intravenous QHS  . clopidogrel  75 mg Oral Q breakfast  . docusate sodium  100 mg Oral BID  . enoxaparin  1 mg/kg (Adjusted) Subcutaneous QHS  . insulin aspart  0-15 Units Subcutaneous TID WC  . insulin detemir  15 Units Subcutaneous QHS  . lidocaine  1 patch Transdermal Q24H  . niacin  1,000 mg Oral BID  . rosuvastatin  20 mg Oral Daily  . senna  1 tablet Oral BID  . Tamsulosin HCl  0.4 mg Oral QHS   Continuous Infusions:    . sodium chloride 125 mL/hr at 08/15/11 0740   PRN Meds:.acetaminophen, acetaminophen, albuterol, morphine, ondansetron (ZOFRAN) IV, ondansetron Assessment/Plan: Present on Admission:  1. UTI with sepsis syndrome  - Continue Ceftriaxone, WBC trending up today, but urine cultures and negative to date will continue current antibiotics follow recheck and adjust antibiotics as appropriate.,  -Decrease IV fluids as hypotension resolved as well as acute renal failure, continue Coreg.  2. Confusion - Likely subacute infections/toxic metabolic encephalopathy,  -also xanax discontinued as requested.   3. ARF:  Resolved, decrease IV fluids as above, follow. 4. DVT  Extensive left leg DVT, likely due to poor mobility at SNF and recent surgery.  - risk / benefit conversation per admitting M.D. with family and pt and they elect for anticoagulation.   -guaic is negative (has new anemia). No current evidence of pulmonary embolism.  -Will start the Coumadin and follow.  5. Anemia:  -Hemoglobin stable this a.m., follow  up on anemia panel. -Anemia panel consistent with chronic disease anemia Hct now 28 on admission, but this is down from 32.8 presumably before the surgery since this was  on 1/16, so this could have been peripoperative blood loss. Guaic negative as above. Anemia  panel with am labs and just monitor for now.   6. diabetes mellitus, uncontrolled: will continue his detemir and give moderate SSI   7. ? urinary retention: -Resolved, Likely secondary to BPH - continue flomax,  8. CAD s/p CABG:  Chest pain free, continue plavix, statin, niacin.  coreg resumed. Holding lisinopril for now.   -Patient to be transferred  to telemetry bed when available.  LOS: 2 days   Shaun Zuccaro C 08/15/2011, 9:03 AM [phone]

## 2011-08-16 LAB — GLUCOSE, CAPILLARY
Glucose-Capillary: 149 mg/dL — ABNORMAL HIGH (ref 70–99)
Glucose-Capillary: 174 mg/dL — ABNORMAL HIGH (ref 70–99)
Glucose-Capillary: 159 mg/dL — ABNORMAL HIGH (ref 70–99)

## 2011-08-16 LAB — CBC
HCT: 26.8 % — ABNORMAL LOW (ref 39.0–52.0)
MCH: 32 pg (ref 26.0–34.0)
MCHC: 33.6 g/dL (ref 30.0–36.0)
MCV: 95.4 fL (ref 78.0–100.0)
RDW: 13 % (ref 11.5–15.5)
WBC: 16.4 10*3/uL — ABNORMAL HIGH (ref 4.0–10.5)

## 2011-08-16 MED ORDER — WARFARIN SODIUM 4 MG PO TABS
4.0000 mg | ORAL_TABLET | Freq: Once | ORAL | Status: AC
  Administered 2011-08-16: 4 mg via ORAL
  Filled 2011-08-16: qty 1

## 2011-08-16 NOTE — Consult Note (Signed)
Reason for Consult: Wound check Referring Physician: Donnalee Curry, MD  Roy Hickman is an 71 y.o. male.  HPI: Neurosurgical consultation was requested by Dr. Suanne Marker to assess the patient's wound and provide further recommendations. He is 3-1/2 weeks status post a C3-T1 posterior cervical laminectomy and C3-C7 posterior cervical arthrodesis by Dr. Hilda Lias. He was discharged on his sixth postoperative day to a skilled nursing facility (he had resided in a skilled nursing facility since last fall) and he was readmitted 3 days ago because of urosepsis and a left lower extremity DVT to the hospitalist service at [facility]. He was started on antibiotic therapy for the urosepsis and Lovenox and Coumadin for DVT (noting that he is already on Plavix).  Her nursing note yesterday indicated that the dressing was stained with old blood and saturated with new serosanguineous fluid and Dr. Suanne Marker contacted [facility] and requested consultation. Dr. [name] is out of town and therefore I saw the patient.  The patient himself is without complaints. He is immobilized in a hard cervical collar (ASPEN).  Past Medical History:  Past Medical History  Diagnosis Date  . Hypertension   . Diabetes mellitus   . MI (mitral incompetence)   . Myocardial infarction 2007    s/p CABG   . Coronary artery disease   . GERD (gastroesophageal reflux disease)     Past Surgical History:  Past Surgical History  Procedure Date  . Elbow surgery     left  . Laminectomy   . Cervical spine surgery   . Coronary artery bypass graft 2008  . Back surgery   . Posterior cervical fusion/foraminotomy 07/21/2011    Procedure: POSTERIOR CERVICAL FUSION/FORAMINOTOMY LEVEL 5;  Surgeon: Karn Cassis, MD;  Location: [facility];  Service: Neurosurgery;  Laterality: N/A;  Cervical three to Thoracic one Laminectomy, posterolateral arthrodesis, with Lateral Mass screws    Family History:  Family  History  Problem Relation Age of Onset  . Heart failure Father   . Heart failure Sister     Social History:  reports that he quit smoking about 24 years ago. His smoking use included Cigarettes. He has a 50 pack-year smoking history. He has never used smokeless tobacco. He reports that he does not drink alcohol or use illicit drugs.  Allergies: No Known Allergies  Medications: I have reviewed the patient's current medications.  Results for orders placed during the hospital encounter of 08/13/11 (from the past 48 hour(s))  GLUCOSE, CAPILLARY     Status: Abnormal   Collection Time   08/14/11  4:46 PM      Component Value Range Comment   Glucose-Capillary 251 (*) 70 - 99 (mg/dL)    Comment 1 Documented in Chart      Comment 2 Notify RN     GLUCOSE, CAPILLARY     Status: Abnormal   Collection Time   08/14/11  9:45 PM      Component Value Range Comment   Glucose-Capillary 203 (*) 70 - 99 (mg/dL)    Comment 1 Documented in Chart      Comment 2 Notify RN     CBC     Status: Abnormal   Collection Time   08/15/11  3:19 AM      Component Value Range Comment   WBC 18.2 (*) 4.0 - 10.5 (K/uL)    RBC 2.83 (*) 4.22 - 5.81 (MIL/uL)    Hemoglobin 9.1 (*) 13.0 - 17.0 (g/dL)    HCT  27.0 (*) 39.0 - 52.0 (%)    MCV 95.4  78.0 - 100.0 (fL)    MCH 32.2  26.0 - 34.0 (pg)    MCHC 33.7  30.0 - 36.0 (g/dL)    RDW 16.1  09.6 - 04.5 (%)    Platelets 351  150 - 400 (K/uL)   BASIC METABOLIC PANEL     Status: Abnormal   Collection Time   08/15/11  3:19 AM      Component Value Range Comment   Sodium 137  135 - 145 (mEq/L)    Potassium 4.6  3.5 - 5.1 (mEq/L)    Chloride 106  96 - 112 (mEq/L)    CO2 21  19 - 32 (mEq/L)    Glucose, Bld 220 (*) 70 - 99 (mg/dL)    BUN 30 (*) 6 - 23 (mg/dL)    Creatinine, Ser 4.09  0.50 - 1.35 (mg/dL)    Calcium 8.9  8.4 - 10.5 (mg/dL)    GFR calc non Af Amer 53 (*) >90 (mL/min)    GFR calc Af Amer 62 (*) >90 (mL/min)   GLUCOSE, CAPILLARY     Status: Abnormal   Collection  Time   08/15/11  8:08 AM      Component Value Range Comment   Glucose-Capillary 234 (*) 70 - 99 (mg/dL)    Comment 1 Notify RN      Comment 2 Documented in Chart     PROTIME-INR     Status: Abnormal   Collection Time   08/15/11 10:15 AM      Component Value Range Comment   Prothrombin Time 15.7 (*) 11.6 - 15.2 (seconds)    INR 1.22  0.00 - 1.49    GLUCOSE, CAPILLARY     Status: Abnormal   Collection Time   08/15/11 12:11 PM      Component Value Range Comment   Glucose-Capillary 199 (*) 70 - 99 (mg/dL)   GLUCOSE, CAPILLARY     Status: Abnormal   Collection Time   08/15/11  4:39 PM      Component Value Range Comment   Glucose-Capillary 220 (*) 70 - 99 (mg/dL)   GLUCOSE, CAPILLARY     Status: Abnormal   Collection Time   08/15/11  9:26 PM      Component Value Range Comment   Glucose-Capillary 196 (*) 70 - 99 (mg/dL)    Comment 1 Notify RN     CBC     Status: Abnormal   Collection Time   08/16/11  4:21 AM      Component Value Range Comment   WBC 16.4 (*) 4.0 - 10.5 (K/uL)    RBC 2.81 (*) 4.22 - 5.81 (MIL/uL)    Hemoglobin 9.0 (*) 13.0 - 17.0 (g/dL)    HCT 81.1 (*) 91.4 - 52.0 (%)    MCV 95.4  78.0 - 100.0 (fL)    MCH 32.0  26.0 - 34.0 (pg)    MCHC 33.6  30.0 - 36.0 (g/dL)    RDW 78.2  95.6 - 21.3 (%)    Platelets 357  150 - 400 (K/uL)   PROTIME-INR     Status: Abnormal   Collection Time   08/16/11  4:21 AM      Component Value Range Comment   Prothrombin Time 17.8 (*) 11.6 - 15.2 (seconds)    INR 1.44  0.00 - 1.49    GLUCOSE, CAPILLARY     Status: Abnormal   Collection Time   08/16/11  7:50 AM      Component Value Range Comment   Glucose-Capillary 149 (*) 70 - 99 (mg/dL)   GLUCOSE, CAPILLARY     Status: Abnormal   Collection Time   08/16/11 11:32 AM      Component Value Range Comment   Glucose-Capillary 144 (*) 70 - 99 (mg/dL)     Blood pressure 161/09, pulse 105, temperature 98.1 F (36.7 C), temperature source Oral, resp. rate 20, height 6' (1.829 m), weight 84.4 kg  (186 lb 1.1 oz), SpO2 95.00%.  Patient is an elderly male immobilized in a hard cervical collar in no acute distress. Awake and alert, following commands, moving all 4 extremities.  Examining his wound, the incision is healing nicely. There was a small amount of serosanguineous drainage on the upper portion of the dressing. No purulence was seen. Staples are in place and there is mild redness immediately adjacent to the staples consistent with a localized reaction rather than infection.  Assessment/Plan: I examined the patient with his nurse Carley Hammed, we removed the dressing together and I gave her instructions regarding further wound care. I've asked her to remove the staples after prepping the skin with isopropyl alcohol, and to apply a new dry dressing, and to change the dressing each day. Orders have been placed.  The patient will need to followup with Dr. [name] in his office next week [phone]).  Please call if there are further concerns.  Hewitt Shorts, MD 08/16/2011, 1:44 PM

## 2011-08-16 NOTE — Progress Notes (Signed)
Subjective: Denies any new c/o today, states  Neck staples were removed per NS today and his neck feels much better. Still with confusion. Objective: Vital signs in last 24 hours: Temp:  [98 F (36.7 C)-98.3 F (36.8 C)] 98.3 F (36.8 C) (02/11 1455) Pulse Rate:  [105-118] 105  (02/11 1455) Resp:  [20] 20  (02/11 1455) BP: (139-165)/(72-83) 139/72 mmHg (02/11 1455) SpO2:  [95 %-98 %] 98 % (02/11 1455) Last BM Date: 08/15/11 Intake/Output from previous day: 02/10 0701 - 02/11 0700 In: 1977 [P.O.:360; I.V.:1565; IV Piggyback:52] Out: 375 [Urine:375] Intake/Output this shift: Total I/O In: 240 [P.O.:240] Out: 600 [Urine:600]    General Appearance:    Alert and oriented x1, cooperative, no distress, appears stated age he answers simple questions appropriately  Neck-has a cervical collar on.   Lungs:     Clear to auscultation bilaterally, respirations unlabored   Heart:    Regular rate and rhythm, S1 and S2 normal, no murmur, rub   or gallop  Abdomen:     Soft, non-tender, bowel sounds active all four quadrants,    no masses, no organomegaly  Extremities:    no cyanosis or edema  Neurologic:   CNII-XII intact, normal strength, nonfocal.      Weight change:   Intake/Output Summary (Last 24 hours) at 08/16/11 1608 Last data filed at 08/16/11 1525  Gross per 24 hour  Intake   2090 ml  Output    975 ml  Net   1115 ml    Lab Results:   Basename 08/15/11 0319 08/14/11 0330  NA 137 134*  K 4.6 4.4  CL 106 100  CO2 21 19  GLUCOSE 220* 206*  BUN 30* 41*  CREATININE 1.30 2.39*  CALCIUM 8.9 9.6    Basename 08/16/11 0421 08/15/11 0319  WBC 16.4* 18.2*  HGB 9.0* 9.1*  HCT 26.8* 27.0*  PLT 357 351  MCV 95.4 95.4   PT/INR  Basename 08/16/11 0421 08/15/11 1015  LABPROT 17.8* 15.7*  INR 1.44 1.22   ABG No results found for this basename: PHART:2,PCO2:2,PO2:2,HCO3:2 in the last 72 hours  Micro Results: Recent Results (from the past 240 hour(s))  URINE CULTURE      Status: Normal   Collection Time   08/13/11  5:13 PM      Component Value Range Status Comment   Specimen Description URINE, CATHETERIZED   Final    Special Requests NONE   Final    Culture  Setup Time [id number]   Final    Colony Count NO GROWTH   Final    Culture NO GROWTH   Final    Report Status 08/15/2011 FINAL   Final   MRSA PCR SCREENING     Status: Normal   Collection Time   08/13/11 11:08 PM      Component Value Range Status Comment   MRSA by PCR NEGATIVE  NEGATIVE  Final    Studies/Results: No results found. Medications: Scheduled Meds:    . ALPRAZolam  0.5 mg Oral Once  . carvedilol  12.5 mg Oral BID WC  . cefTRIAXone (ROCEPHIN)  IV  1 g Intravenous QHS  . clopidogrel  75 mg Oral Q breakfast  . docusate sodium  100 mg Oral BID  . enoxaparin  1 mg/kg (Adjusted) Subcutaneous Q12H  . insulin aspart  0-15 Units Subcutaneous TID WC  . insulin detemir  15 Units Subcutaneous QHS  . lidocaine  1 patch Transdermal Q24H  . niacin  1,000 mg  Oral BID  . pantoprazole  40 mg Oral q morning - 10a  . rosuvastatin  20 mg Oral Daily  . senna  1 tablet Oral BID  . Tamsulosin HCl  0.4 mg Oral QHS  . warfarin  4 mg Oral ONCE-1800  . warfarin  5 mg Oral ONCE-1800   Continuous Infusions:    . sodium chloride 50 mL/hr at 08/15/11 1232   PRN Meds:.acetaminophen, acetaminophen, albuterol, morphine, ondansetron (ZOFRAN) IV, ondansetron Assessment/Plan: Present on Admission:  1. UTI with sepsis syndrome  - Continue Ceftriaxone, WBC trend down today, urine cultures and negative to date will continue current antibiotics follow.  .  2. Confusion - Likely subacute infections/toxic metabolic encephalopathy,  -also xanax discontinued as requested.   3. ARF:  Resolved with hydration 4. DVT  Extensive left leg DVT, likely due to poor mobility at SNF and recent surgery.  - risk / benefit conversation per admitting M.D. with family and pt and they elect for anticoagulation.   -guaic  is negative (has new anemia). No current evidence of pulmonary embolism.  -continue Coumadin, lovenox and follow.  5. Anemia:  -Hemoglobin remaining stable. -Anemia panel consistent with chronic disease anemia Admission hgb likely down due to peripoperative blood loss. Guaic negative as above. 6. diabetes mellitus, uncontrolled: will continue his detemir and give moderate SSI   7. ? urinary retention: -Resolved, Likely secondary to BPH - continue flomax,  8. CAD s/p CABG:  Chest pain free, continue plavix, statin, niacin.  coreg resumed. Holding lisinopril for now.   9.s/p cervial spine surgery, appreciate NS f/u for staple removal and local incision care recommendations today.    LOS: 3 days   Sharilyn Geisinger C 08/16/2011, 4:08 PM [phone]

## 2011-08-16 NOTE — Progress Notes (Signed)
ANTICOAGULATION CONSULT NOTE - Follow Up Consult  Pharmacy Consult for Lovenox and Warfarin  Indication: DVT  No Known Allergies  Patient Measurements: Height: 6' (182.9 cm) Weight: 186 lb 1.1 oz (84.4 kg) IBW/kg (Calculated) : 77.6   Vital Signs: Temp: 98.1 F (36.7 C) (02/11 0700) Temp src: Oral (02/11 0700) BP: 143/80 mmHg (02/11 0700) Pulse Rate: 105  (02/11 0700)  Labs:  Basename 08/16/11 0421 08/15/11 1015 08/15/11 0319 08/14/11 0330 08/13/11 1620  HGB 9.0* -- 9.1* -- --  HCT 26.8* -- 27.0* 28.1* --  PLT 357 -- 351 375 --  APTT -- -- -- -- --  LABPROT 17.8* 15.7* -- -- --  INR 1.44 1.22 -- -- --  HEPARINUNFRC -- -- -- -- --  CREATININE -- -- 1.30 2.39* 2.45*  CKTOTAL -- -- -- -- --  CKMB -- -- -- -- --  TROPONINI -- -- -- -- --   Estimated Creatinine Clearance: 56.4 ml/min (by C-G formula based on Cr of 1.3).   Medications:  Scheduled:    . ALPRAZolam  0.5 mg Oral Once  . carvedilol  12.5 mg Oral BID WC  . cefTRIAXone (ROCEPHIN)  IV  1 g Intravenous QHS  . clopidogrel  75 mg Oral Q breakfast  . docusate sodium  100 mg Oral BID  . enoxaparin  1 mg/kg (Adjusted) Subcutaneous Q12H  . insulin aspart  0-15 Units Subcutaneous TID WC  . insulin detemir  15 Units Subcutaneous QHS  . lidocaine  1 patch Transdermal Q24H  . niacin  1,000 mg Oral BID  . pantoprazole  40 mg Oral q morning - 10a  . rosuvastatin  20 mg Oral Daily  . senna  1 tablet Oral BID  . Tamsulosin HCl  0.4 mg Oral QHS  . warfarin  5 mg Oral ONCE-1800   Infusions:    . sodium chloride 50 mL/hr at 08/15/11 1232   PRN: acetaminophen, acetaminophen, albuterol, morphine, ondansetron (ZOFRAN) IV, ondansetron  Assessment: 71 yo M with LLE DVT on Day #2 of 5 day minimum overlap. Bloody neck dressing noted 2/10pm RN notes. Small amount of serosanguineous drainage on the upper portion of the dressing noted in neuro MD's note today. No orders received to stop dosing warfarin or Lovenox, so will  continue as planned.  Goal of Therapy:  INR 2-3   Plan:  1)  Warfarin 4mg  PO x1 at 18:00 2) Continue Lovenox 80mg  SQ q12h through 2/14 AND until INR > 2 x 24 hours  Annia Belt 08/16/2011,2:31 PM

## 2011-08-17 LAB — BASIC METABOLIC PANEL
BUN: 8 mg/dL (ref 6–23)
Calcium: 8.6 mg/dL (ref 8.4–10.5)
Creatinine, Ser: 0.74 mg/dL (ref 0.50–1.35)
GFR calc Af Amer: >90 mL/min (ref >90)
GFR calc non Af Amer: 90 mL/min — ABNORMAL LOW (ref >90)

## 2011-08-17 LAB — GLUCOSE, CAPILLARY
Glucose-Capillary: 163 mg/dL — ABNORMAL HIGH (ref 70–99)
Glucose-Capillary: 133 mg/dL — ABNORMAL HIGH (ref 70–99)

## 2011-08-17 MED ORDER — LORAZEPAM 2 MG/ML IJ SOLN
1.0000 mg | Freq: Once | INTRAMUSCULAR | Status: AC
  Administered 2011-08-17: 1 mg via INTRAVENOUS
  Filled 2011-08-17 (×2): qty 1

## 2011-08-17 MED ORDER — POTASSIUM CHLORIDE CRYS ER 20 MEQ PO TBCR
60.0000 meq | EXTENDED_RELEASE_TABLET | Freq: Once | ORAL | Status: AC
  Administered 2011-08-18: 60 meq via ORAL
  Filled 2011-08-17: qty 3

## 2011-08-17 MED ORDER — WARFARIN SODIUM 2 MG PO TABS
2.0000 mg | ORAL_TABLET | Freq: Once | ORAL | Status: AC
  Administered 2011-08-17: 2 mg via ORAL
  Filled 2011-08-17: qty 1

## 2011-08-17 MED ORDER — LORAZEPAM 2 MG/ML IJ SOLN
1.0000 mg | Freq: Once | INTRAMUSCULAR | Status: AC
  Administered 2011-08-17: 1 mg via INTRAVENOUS
  Filled 2011-08-17: qty 1

## 2011-08-17 NOTE — Progress Notes (Signed)
Subjective:  Still with confusion, no new c/o reported. Objective: Vital signs in last 24 hours: Temp:  [97.4 F (36.3 C)-98.4 F (36.9 C)] 98.4 F (36.9 C) (02/12 2156) Pulse Rate:  [84-118] 103  (02/12 2156) Resp:  [20] 20  (02/12 2156) BP: (149-164)/(74-81) 157/81 mmHg (02/12 2156) SpO2:  [97 %-98 %] 97 % (02/12 2156) Last BM Date: 08/17/11 Intake/Output from previous day: 02/11 0701 - 02/12 0700 In: 1270 [P.O.:420; I.V.:800; IV Piggyback:50] Out: 1340 [Urine:1340] Intake/Output this shift: Total I/O In: -  Out: 375 [Urine:375]    General Appearance:    Alert and oriented x1, cooperative, no distress, appears stated age Neck-has a cervical collar on.   Lungs:     Clear to auscultation bilaterally, respirations unlabored   Heart:    Regular rate and rhythm, S1 and S2 normal, no murmur, rub   or gallop  Abdomen:     Soft, non-tender, bowel sounds active all four quadrants,    no masses, no organomegaly  Extremities:    no cyanosis or edema  Neurologic:   CNII-XII intact, normal strength, nonfocal.      Weight change:   Intake/Output Summary (Last 24 hours) at 08/17/11 2302 Last data filed at 08/17/11 2156  Gross per 24 hour  Intake    810 ml  Output   1295 ml  Net   -485 ml    Lab Results:   [facility] 08/17/11 1900 08/15/11 0319  NA 138 137  K 3.3* 4.6  CL 106 106  CO2 24 21  GLUCOSE 145* 220*  BUN 8 30*  CREATININE 0.74 1.30  CALCIUM 8.6 8.9    Basename 08/16/11 0421 08/15/11 0319  WBC 16.4* 18.2*  HGB 9.0* 9.1*  HCT 26.8* 27.0*  PLT 357 351  MCV 95.4 95.4   PT/INR  Basename 08/17/11 0440 08/16/11 0421  LABPROT 23.2* 17.8*  INR 2.02* 1.44   ABG No results found for this basename: PHART:2,PCO2:2,PO2:2,HCO3:2 in the last 72 hours  Micro Results: Recent Results (from the past 240 hour(s))  URINE CULTURE     Status: Normal   Collection Time   08/13/11  5:13 PM      Component Value Range Status Comment   Specimen Description URINE,  CATHETERIZED   Final    Special Requests NONE   Final    Culture  Setup Time [id number]   Final    Colony Count NO GROWTH   Final    Culture NO GROWTH   Final    Report Status 08/15/2011 FINAL   Final   MRSA PCR SCREENING     Status: Normal   Collection Time   08/13/11 11:08 PM      Component Value Range Status Comment   MRSA by PCR NEGATIVE  NEGATIVE  Final    Studies/Results: No results found. Medications: Scheduled Meds:    . carvedilol  12.5 mg Oral BID WC  . cefTRIAXone (ROCEPHIN)  IV  1 g Intravenous QHS  . clopidogrel  75 mg Oral Q breakfast  . docusate sodium  100 mg Oral BID  . enoxaparin  1 mg/kg (Adjusted) Subcutaneous Q12H  . insulin aspart  0-15 Units Subcutaneous TID WC  . insulin detemir  15 Units Subcutaneous QHS  . lidocaine  1 patch Transdermal Q24H  . LORazepam  1 mg Intravenous Once  . LORazepam  1 mg Intravenous Once  . niacin  1,000 mg Oral BID  . pantoprazole  40 mg Oral q morning - 10a  .  potassium chloride  60 mEq Oral Once  . rosuvastatin  20 mg Oral Daily  . senna  1 tablet Oral BID  . Tamsulosin HCl  0.4 mg Oral QHS  . warfarin  2 mg Oral ONCE-1800   Continuous Infusions:    . sodium chloride 50 mL/hr at 08/16/11 2037   PRN Meds:.acetaminophen, acetaminophen, albuterol, morphine, ondansetron (ZOFRAN) IV, ondansetron Assessment/Plan: Present on Admission:  1. UTI with sepsis syndrome  - Continue Ceftriaxone for now, follow and recheck wbc -urine cultures negative to date.  2. Confusion - Likely subacute infections/toxic metabolic encephalopathy,  - no improvement so far with treatment of above, will obtain reversible w/u- B12, folate- normal on admit, check RPR, tsh,  ammonia and follow -CT head neg on admit  -also xanax discontinued on admit as requested.   3. ARF:  Resolved with hydration 4. DVT  Extensive left leg DVT, likely due to poor mobility at SNF and recent surgery.  - risk / benefit conversation per admitting M.D. with  family and pt and they elected for anticoagulation.   -guaic is negative (has new anemia). No current evidence of pulmonary embolism.  -continue Coumadin, lovenox  INR therapeutic today, overlap, if still therapeutic in am - dc lovenox then.  5. Anemia:  -Hemoglobin remaining stable. -Anemia panel consistent with chronic disease anemia Admission hgb likely down due to peripoperative blood loss. Guaic negative as above. 6. diabetes mellitus, uncontrolled: will continue his detemir and give moderate SSI   7. ? urinary retention: -Resolved, Likely secondary to BPH - continue flomax,  8. CAD s/p CABG:  Chest pain free, continue plavix, statin, niacin.  coreg resumed. Holding lisinopril for now.   9.s/p cervial spine surgery, appreciate NS f/u for staple removal and local incision care recommendations on 2/11.  10. Hypokalemia -replace K    LOS: 4 days   Excell Neyland C 08/17/2011, 6:11 PM [phone]

## 2011-08-17 NOTE — Progress Notes (Signed)
ANTICOAGULATION CONSULT NOTE - Follow Up Consult  Pharmacy Consult for Lovenox and Warfarin  Indication: DVT  No Known Allergies  Patient Measurements: Height: 6' (182.9 cm) Weight: 186 lb 1.1 oz (84.4 kg) IBW/kg (Calculated) : 77.6   Vital Signs: Temp: 97.4 F (36.3 C) (02/12 0404) Temp src: Oral (02/12 0404) BP: 149/78 mmHg (02/12 0404) Pulse Rate: 118  (02/12 0404)  Labs:  Basename 08/17/11 0440 08/16/11 0421 08/15/11 1015 08/15/11 0319  HGB -- 9.0* -- 9.1*  HCT -- 26.8* -- 27.0*  PLT -- 357 -- 351  APTT -- -- -- --  LABPROT 23.2* 17.8* 15.7* --  INR 2.02* 1.44 1.22 --  HEPARINUNFRC -- -- -- --  CREATININE -- -- -- 1.30  CKTOTAL -- -- -- --  CKMB -- -- -- --  TROPONINI -- -- -- --   Estimated Creatinine Clearance: 56.4 ml/min (by C-G formula based on Cr of 1.3).   Medications:  Scheduled:     . carvedilol  12.5 mg Oral BID WC  . cefTRIAXone (ROCEPHIN)  IV  1 g Intravenous QHS  . clopidogrel  75 mg Oral Q breakfast  . docusate sodium  100 mg Oral BID  . enoxaparin  1 mg/kg (Adjusted) Subcutaneous Q12H  . insulin aspart  0-15 Units Subcutaneous TID WC  . insulin detemir  15 Units Subcutaneous QHS  . lidocaine  1 patch Transdermal Q24H  . LORazepam  1 mg Intravenous Once  . LORazepam  1 mg Intravenous Once  . niacin  1,000 mg Oral BID  . pantoprazole  40 mg Oral q morning - 10a  . rosuvastatin  20 mg Oral Daily  . senna  1 tablet Oral BID  . Tamsulosin HCl  0.4 mg Oral QHS  . warfarin  4 mg Oral ONCE-1800   Infusions:     . sodium chloride 50 mL/hr at 08/16/11 2037   PRN: acetaminophen, acetaminophen, albuterol, morphine, ondansetron (ZOFRAN) IV, ondansetron  Assessment: 71 yo M with LLE DVT on Day #3 of 5 day minimum overlap. Bloody neck dressing noted 2/10pm RN notes. Small amount of serosanguineous drainage on the upper portion of the dressing noted in neuro MD's note from 2/11. No further bleeding events noted. INR has jumped up and is now  therapeutic. Need to continue Lovenox 2 more days to give warfarin time to inhibit clotting factors.  Goal of Therapy:  INR 2-3   Plan:  1)  Warfarin 2mg  PO x1 at 18:00 2) Continue Lovenox 80mg  SQ q12h through 2/14 AND until INR > 2 x 24 hours  Annia Belt 08/17/2011,1:44 PM

## 2011-08-17 NOTE — Progress Notes (Signed)
CSW attempted to visit with the pt who was confused. CSW also attempted to contact family and no one answered the numbers that were listed. CSW spoke with the pts RN and she reports the pts 2 sisters had visited briefly, and the pt was asleep. CSW notes that the pt is from [facility], CSW has called the facility and spoke with Ruby and she reports the pt can return if a bed is available at discharge. CSW will continue to try and reach family to discuss discharge planning. CSW will continue to follow the pt and offer support.  Patrice Paradise, LCSWA 08/17/2011 4:01 PM [phone]

## 2011-08-18 LAB — GLUCOSE, CAPILLARY
Glucose-Capillary: 134 mg/dL — ABNORMAL HIGH (ref 70–99)
Glucose-Capillary: 152 mg/dL — ABNORMAL HIGH (ref 70–99)
Glucose-Capillary: 152 mg/dL — ABNORMAL HIGH (ref 70–99)

## 2011-08-18 LAB — BASIC METABOLIC PANEL
Chloride: 102 mEq/L (ref 96–112)
GFR calc Af Amer: >90 mL/min (ref >90)
GFR calc non Af Amer: >90 mL/min (ref >90)
Potassium: 3.7 mEq/L (ref 3.5–5.1)
Sodium: 135 mEq/L (ref 135–145)

## 2011-08-18 LAB — TSH: TSH: 4.31 u[IU]/mL (ref 0.350–4.500)

## 2011-08-18 LAB — CBC
MCH: 31.9 pg (ref 26.0–34.0)
MCHC: 34.1 g/dL (ref 30.0–36.0)
MCV: 93.8 fL (ref 78.0–100.0)
Platelets: 337 10*3/uL (ref 150–400)
RDW: 12.8 % (ref 11.5–15.5)
WBC: 12.2 10*3/uL — ABNORMAL HIGH (ref 4.0–10.5)

## 2011-08-18 LAB — PROTIME-INR: Prothrombin Time: 23.2 seconds — ABNORMAL HIGH (ref 11.6–15.2)

## 2011-08-18 LAB — RPR: RPR Ser Ql: NONREACTIVE

## 2011-08-18 MED ORDER — MORPHINE SULFATE 2 MG/ML IJ SOLN
0.5000 mg | Freq: Once | INTRAMUSCULAR | Status: AC
  Administered 2011-08-18: 0.5 mg via INTRAVENOUS
  Filled 2011-08-18: qty 1

## 2011-08-18 MED ORDER — WARFARIN SODIUM 3 MG PO TABS
3.0000 mg | ORAL_TABLET | Freq: Once | ORAL | Status: AC
  Administered 2011-08-18: 3 mg via ORAL
  Filled 2011-08-18: qty 1

## 2011-08-18 MED ORDER — NIACIN ER 500 MG PO TBCR
1000.0000 mg | EXTENDED_RELEASE_TABLET | Freq: Two times a day (BID) | ORAL | Status: DC
  Administered 2011-08-18 – 2011-08-20 (×4): 1000 mg via ORAL
  Filled 2011-08-18 (×7): qty 2

## 2011-08-18 MED ORDER — OXYCODONE HCL 5 MG PO TABS
5.0000 mg | ORAL_TABLET | ORAL | Status: DC | PRN
Start: 2011-08-18 — End: 2011-08-20
  Administered 2011-08-18 – 2011-08-20 (×5): 5 mg via ORAL
  Filled 2011-08-18 (×5): qty 1

## 2011-08-18 MED ORDER — KETOROLAC TROMETHAMINE 30 MG/ML IJ SOLN
30.0000 mg | Freq: Four times a day (QID) | INTRAMUSCULAR | Status: DC | PRN
  Administered 2011-08-18 – 2011-08-20 (×3): 30 mg via INTRAVENOUS
  Filled 2011-08-18 (×4): qty 1

## 2011-08-18 NOTE — Progress Notes (Signed)
ANTICOAGULATION CONSULT NOTE - Follow Up Consult  Pharmacy Consult for Lovenox and Warfarin  Indication: DVT  No Known Allergies  Patient Measurements: Height: 6' (182.9 cm) Weight: 186 lb 1.1 oz (84.4 kg) IBW/kg (Calculated) : 77.6   Vital Signs: Temp: 97.9 F (36.6 C) (02/13 0535) Temp src: Oral (02/13 0535) BP: 156/90 mmHg (02/13 0535) Pulse Rate: 96  (02/13 0535)  Labs:  Basename 08/18/11 0430 08/17/11 1900 08/17/11 0440 08/16/11 0421  HGB 9.2* -- -- 9.0*  HCT 27.0* -- -- 26.8*  PLT 337 -- -- 357  APTT -- -- -- --  LABPROT 23.2* -- 23.2* 17.8*  INR 2.02* -- 2.02* 1.44  HEPARINUNFRC -- -- -- --  CREATININE 0.72 0.74 -- --  CKTOTAL -- -- -- --  CKMB -- -- -- --  TROPONINI -- -- -- --   Estimated Creatinine Clearance: 91.6 ml/min (by C-G formula based on Cr of 0.72).   Medications:  Scheduled:     . carvedilol  12.5 mg Oral BID WC  . cefTRIAXone (ROCEPHIN)  IV  1 g Intravenous QHS  . clopidogrel  75 mg Oral Q breakfast  . docusate sodium  100 mg Oral BID  . enoxaparin  1 mg/kg (Adjusted) Subcutaneous Q12H  . insulin aspart  0-15 Units Subcutaneous TID WC  . insulin detemir  15 Units Subcutaneous QHS  . lidocaine  1 patch Transdermal Q24H  . LORazepam  1 mg Intravenous Once  .  morphine injection  0.5 mg Intravenous Once  . niacin  1,000 mg Oral BID  . pantoprazole  40 mg Oral q morning - 10a  . potassium chloride  60 mEq Oral Once  . rosuvastatin  20 mg Oral Daily  . senna  1 tablet Oral BID  . Tamsulosin HCl  0.4 mg Oral QHS  . warfarin  2 mg Oral ONCE-1800   Infusions:     . sodium chloride 50 mL/hr at 08/18/11 1018   PRN: acetaminophen, acetaminophen, albuterol, morphine, ondansetron (ZOFRAN) IV, ondansetron  Assessment: 71 yo M with LLE DVT on Day #4 of 5 day minimum overlap. Bloody neck dressing noted 2/10pm RN notes. Small amount of serosanguineous drainage on the upper portion of the dressing noted in neuro MD's note from 2/11. No further  bleeding events noted. INR remains therapeutic. Need to continue Lovenox 1 more day to give warfarin time to inhibit clotting factors and per CHEST guideline recommendations. If INR on 2/14am remains >2, can d/c Lovenox after 2/14pm dose.  Goal of Therapy:  INR 2-3   Plan:  1)  Warfarin 3mg  PO x1 at 18:00 2) Continue Lovenox 80mg  SQ q12h through 2/14 pm AND until INR > 2 x 24 hours per CHEST guidelines.  Annia Belt [phone] 08/18/2011,1:22 PM

## 2011-08-18 NOTE — Progress Notes (Addendum)
CSW noted that the pts RN placed a sticky note stating that the pts family had concerns about him returning to [facility]. CSW spoke to the pts sister Steward Drone and  they are hoping to get him in a facility closer to their homes in [place]. CSW will fax clinicals out to see which facilities in [place] and [facility] make a bed offer. CSW will continue to follow and offer support.  Golden Pop 08/18/2011 1:56 PM [phone]  CSW has notified the pts sister Talbert Forest that the facility in [facility] that they were wanting the pt to go to does not have a male bed available. CSW awaiting to hear from other facilities that the pt was faxed out to. CSW will continue to follow.  08/18/2011 3:42 PM

## 2011-08-18 NOTE — Progress Notes (Signed)
Subjective:  Still with confusion, no new c/o reported. Objective: Vital signs in last 24 hours: Temp:  [97.6 F (36.4 C)-98.4 F (36.9 C)] 97.6 F (36.4 C) (02/13 1437) Pulse Rate:  [95-103] 95  (02/13 1437) Resp:  [20] 20  (02/13 1437) BP: (112-157)/(67-90) 112/67 mmHg (02/13 1437) SpO2:  [92 %-97 %] 96 % (02/13 1437) Last BM Date: 08/18/11 Intake/Output from previous day: 02/12 0701 - 02/13 0700 In: 1740 [P.O.:540; I.V.:1150; IV Piggyback:50] Out: 1140 [Urine:1140] Intake/Output this shift: Total I/O In: 821.7 [P.O.:390; I.V.:431.7] Out: 500 [Urine:500]    General Appearance:    Alert and oriented x1, cooperative, no distress, appears stated age Neck-has a cervical collar on.   Lungs:     Clear to auscultation bilaterally, respirations unlabored   Heart:    Regular rate and rhythm, S1 and S2 normal, no murmur, rub   or gallop  Abdomen:     Soft, non-tender, bowel sounds active all four quadrants,    no masses, no organomegaly  Extremities:    no cyanosis or edema  Neurologic:   CNII-XII intact, normal strength, nonfocal.      Weight change:   Intake/Output Summary (Last 24 hours) at 08/18/11 1459 Last data filed at 08/18/11 1438  Gross per 24 hour  Intake 2381.67 ml  Output   1140 ml  Net 1241.67 ml    Lab Results:   Basename 08/18/11 0430 08/17/11 1900  NA 135 138  K 3.7 3.3*  CL 102 106  CO2 22 24  GLUCOSE 164* 145*  BUN 7 8  CREATININE 0.72 0.74  CALCIUM 8.6 8.6    Basename 08/18/11 0430 08/16/11 0421  WBC 12.2* 16.4*  HGB 9.2* 9.0*  HCT 27.0* 26.8*  PLT 337 357  MCV 93.8 95.4   PT/INR  Basename 08/18/11 0430 08/17/11 0440  LABPROT 23.2* 23.2*  INR 2.02* 2.02*   ABG No results found for this basename: PHART:2,PCO2:2,PO2:2,HCO3:2 in the last 72 hours  Micro Results: Recent Results (from the past 240 hour(s))  URINE CULTURE     Status: Normal   Collection Time   08/13/11  5:13 PM      Component Value Range Status Comment   Specimen  Description URINE, CATHETERIZED   Final    Special Requests NONE   Final    Culture  Setup Time [id number]   Final    Colony Count NO GROWTH   Final    Culture NO GROWTH   Final    Report Status 08/15/2011 FINAL   Final   MRSA PCR SCREENING     Status: Normal   Collection Time   08/13/11 11:08 PM      Component Value Range Status Comment   MRSA by PCR NEGATIVE  NEGATIVE  Final    Studies/Results: No results found. Medications: Scheduled Meds:    . carvedilol  12.5 mg Oral BID WC  . cefTRIAXone (ROCEPHIN)  IV  1 g Intravenous QHS  . clopidogrel  75 mg Oral Q breakfast  . docusate sodium  100 mg Oral BID  . enoxaparin  1 mg/kg (Adjusted) Subcutaneous Q12H  . insulin aspart  0-15 Units Subcutaneous TID WC  . insulin detemir  15 Units Subcutaneous QHS  . lidocaine  1 patch Transdermal Q24H  . LORazepam  1 mg Intravenous Once  .  morphine injection  0.5 mg Intravenous Once  . niacin  1,000 mg Oral BID  . pantoprazole  40 mg Oral q morning - 10a  .  potassium chloride  60 mEq Oral Once  . rosuvastatin  20 mg Oral Daily  . senna  1 tablet Oral BID  . Tamsulosin HCl  0.4 mg Oral QHS  . warfarin  2 mg Oral ONCE-1800  . warfarin  3 mg Oral ONCE-1800   Continuous Infusions:    . sodium chloride 50 mL/hr at 08/18/11 1438   PRN Meds:.acetaminophen, acetaminophen, albuterol, morphine, ondansetron (ZOFRAN) IV, ondansetron Assessment/Plan: Present on Admission:  1. UTI with sepsis syndrome  - Continue Ceftriaxone for now, follow and recheck wbc -urine cultures negative to date.  2. Confusion/Metabolic Encephalopathy - Likely subacute infections/toxic metabolic encephalopathy,  - no improvement so far with treatment of above, B12 WNL, folate WNL- RPR WNL, tsh WNL,  Ammonia WNL and follow -CT head neg on admit  -also xanax discontinued on admit as requested.  Will check MRI of head. Will need to find out from family on baseline.  3. ARF:  Resolved with hydration 4. DVT    Extensive left leg DVT, likely due to poor mobility at SNF and recent surgery.  - risk / benefit conversation per admitting M.D. with family and pt and they elected for anticoagulation.   -guaic is negative (has new anemia). No current evidence of pulmonary embolism.  -continue Coumadin, lovenox .  5. Anemia:  -Hemoglobin remaining stable. -Anemia panel consistent with chronic disease anemia Admission hgb likely down due to peripoperative blood loss. Guaic negative as above.  6. diabetes mellitus, uncontrolled:  will continue his detemir and give moderate SSI   7. ? urinary retention: -Resolved, Likely secondary to BPH - continue flomax,   8. CAD s/p CABG:  Chest pain free, continue plavix, statin, niacin.  coreg resumed. Holding lisinopril for now.   9.s/p cervial spine surgery, appreciate NS f/u for staple removal and local incision care recommendations on 2/11.  10. Hypokalemia -replace K    LOS: 5 days   Roy Hickman 08/18/2011, 2:59 PM [phone]

## 2011-08-19 ENCOUNTER — Inpatient Hospital Stay (HOSPITAL_COMMUNITY): Payer: Medicare Other

## 2011-08-19 LAB — CBC
HCT: 25.5 % — ABNORMAL LOW (ref 39.0–52.0)
MCHC: 34.9 g/dL (ref 30.0–36.0)
MCV: 93.8 fL (ref 78.0–100.0)
Platelets: 296 10*3/uL (ref 150–400)
RDW: 13.3 % (ref 11.5–15.5)
WBC: 10.9 10*3/uL — ABNORMAL HIGH (ref 4.0–10.5)

## 2011-08-19 LAB — GLUCOSE, CAPILLARY
Glucose-Capillary: 205 mg/dL — ABNORMAL HIGH (ref 70–99)
Glucose-Capillary: 140 mg/dL — ABNORMAL HIGH (ref 70–99)
Glucose-Capillary: 174 mg/dL — ABNORMAL HIGH (ref 70–99)

## 2011-08-19 LAB — BASIC METABOLIC PANEL
BUN: 9 mg/dL (ref 6–23)
Calcium: 8 mg/dL — ABNORMAL LOW (ref 8.4–10.5)
Creatinine, Ser: 0.75 mg/dL (ref 0.50–1.35)
GFR calc Af Amer: >90 mL/min (ref >90)

## 2011-08-19 LAB — PROTIME-INR
INR: 2.3 — ABNORMAL HIGH (ref 0.00–1.49)
Prothrombin Time: 25.7 seconds — ABNORMAL HIGH (ref 11.6–15.2)

## 2011-08-19 MED ORDER — WARFARIN SODIUM 3 MG PO TABS
3.0000 mg | ORAL_TABLET | Freq: Once | ORAL | Status: AC
  Administered 2011-08-19: 3 mg via ORAL
  Filled 2011-08-19: qty 1

## 2011-08-19 NOTE — Progress Notes (Addendum)
ANTICOAGULATION CONSULT NOTE - Follow Up Consult  Pharmacy Consult for Lovenox and Warfarin  Indication: DVT  No Known Allergies  Patient Measurements: Height: 6' (182.9 cm) Weight: 186 lb 1.1 oz (84.4 kg) IBW/kg (Calculated) : 77.6   Vital Signs: Temp: 97.5 F (36.4 C) (02/14 0608) Temp src: Oral (02/14 0608) BP: 135/73 mmHg (02/14 0608) Pulse Rate: 102  (02/14 0608)  Labs:  Basename 08/19/11 0420 08/18/11 0430 08/17/11 1900 08/17/11 0440  HGB 8.9* 9.2* -- --  HCT 25.5* 27.0* -- --  PLT 296 337 -- --  APTT -- -- -- --  LABPROT 25.7* 23.2* -- 23.2*  INR 2.30* 2.02* -- 2.02*  HEPARINUNFRC -- -- -- --  CREATININE 0.75 0.72 0.74 --  CKTOTAL -- -- -- --  CKMB -- -- -- --  TROPONINI -- -- -- --   Estimated Creatinine Clearance: 91.6 ml/min (by C-G formula based on Cr of 0.75).   Medications:  Scheduled:     . carvedilol  12.5 mg Oral BID WC  . cefTRIAXone (ROCEPHIN)  IV  1 g Intravenous QHS  . clopidogrel  75 mg Oral Q breakfast  . docusate sodium  100 mg Oral BID  . enoxaparin  1 mg/kg (Adjusted) Subcutaneous Q12H  . insulin aspart  0-15 Units Subcutaneous TID WC  . insulin detemir  15 Units Subcutaneous QHS  . lidocaine  1 patch Transdermal Q24H  . niacin  1,000 mg Oral BID  . pantoprazole  40 mg Oral q morning - 10a  . rosuvastatin  20 mg Oral Daily  . senna  1 tablet Oral BID  . Tamsulosin HCl  0.4 mg Oral QHS  . warfarin  3 mg Oral ONCE-1800  . DISCONTD: niacin  1,000 mg Oral BID   Infusions:     . sodium chloride 50 mL/hr at 08/19/11 1610   PRN: acetaminophen, acetaminophen, albuterol, ketorolac, morphine, ondansetron (ZOFRAN) IV, ondansetron, oxyCODONE  Assessment: 71 yo M with LLE DVT on Day #5 of 5 day minimum overlap. Bloody neck dressing noted 2/10pm RN notes. Small amount of serosanguineous drainage on the upper portion of the dressing noted in neuro MD's note from 2/11. No further bleeding events noted. INR remains therapeutic. Will d/c Lovenox  after tonight's dose to complete 5 days of overlap.  Goal of Therapy:  INR 2-3   Plan:  1)  Warfarin 3mg  PO x1 at 18:00 2) D/C Lovenox 80mg  SQ q12h after 2/14 pm dose.  Annia Belt [phone] 08/19/2011,1:32 PM   ADDENDUM: Unable to counsel patient on warfarin due to patient's level of confusion.  Darrol Angel, PharmD

## 2011-08-19 NOTE — Progress Notes (Signed)
Inpatient Diabetes Program Recommendations  AACE/ADA: New Consensus Statement on Inpatient Glycemic Control (2009)  Target Ranges:  Prepandial:   less than 140 mg/dL      Peak postprandial:   less than 180 mg/dL (1-2 hours)      Critically ill patients:  140 - 180 mg/dL   Reason for Visit: Hyperglycemia Results for Roy Hickman, Roy Hickman (MRN [id number]) as of 08/19/2011 14:54  Ref. Range 08/18/2011 21:58 08/19/2011 08:38 08/19/2011 11:43  Glucose-Capillary Latest Range: 70-99 mg/dL 409 (H) 811 (H) 914 (H)     Inpatient Diabetes Program Recommendations Correction (SSI): * Insulin - Meal Coverage: May benefit from Novolog 2 - 3 units tidwc for meal coverage insulin

## 2011-08-19 NOTE — Progress Notes (Signed)
Subjective:  Per family significant improvement in mental status however  Not where he was 3 weeks ago per 1 sister. Patient states neck brace not fitting right. Objective: Vital signs in last 24 hours: Temp:  [97.5 F (36.4 C)-97.9 F (36.6 C)] 97.5 F (36.4 C) (02/14 1610) Pulse Rate:  [93-102] 102  (02/14 0608) Resp:  [16-20] 16  (02/14 0608) BP: (112-135)/(67-73) 135/73 mmHg (02/14 0608) SpO2:  [95 %-96 %] 95 % (02/14 0608) Last BM Date: 08/18/11 Intake/Output from previous day: 02/13 0701 - 02/14 0700 In: 2300 [P.O.:1050; I.V.:1200; IV Piggyback:50] Out: 500 [Urine:500] Intake/Output this shift: Total I/O In: 240 [P.O.:240] Out: -     General Appearance:    Alert and oriented x1, cooperative, no distress, appears stated age Neck-has a cervical collar on.   Lungs:     Clear to auscultation bilaterally, respirations unlabored   Heart:    Regular rate and rhythm, S1 and S2 normal, no murmur, rub   or gallop  Abdomen:     Soft, non-tender, bowel sounds active all four quadrants,    no masses, no organomegaly  Extremities:    no cyanosis or edema  Neurologic:   CNII-XII intact, normal strength, nonfocal.      Weight change:   Intake/Output Summary (Last 24 hours) at 08/19/11 1353 Last data filed at 08/19/11 9604  Gross per 24 hour  Intake   2150 ml  Output    500 ml  Net   1650 ml    Lab Results:   Basename 08/19/11 0420 08/18/11 0430  NA 138 135  K 3.6 3.7  CL 105 102  CO2 23 22  GLUCOSE 123* 164*  BUN 9 7  CREATININE 0.75 0.72  CALCIUM 8.0* 8.6    Basename 08/19/11 0420 08/18/11 0430  WBC 10.9* 12.2*  HGB 8.9* 9.2*  HCT 25.5* 27.0*  PLT 296 337  MCV 93.8 93.8   PT/INR  Basename 08/19/11 0420 08/18/11 0430  LABPROT 25.7* 23.2*  INR 2.30* 2.02*   ABG No results found for this basename: PHART:2,PCO2:2,PO2:2,HCO3:2 in the last 72 hours  Micro Results: Recent Results (from the past 240 hour(s))  URINE CULTURE     Status: Normal   Collection  Time   08/13/11  5:13 PM      Component Value Range Status Comment   Specimen Description URINE, CATHETERIZED   Final    Special Requests NONE   Final    Culture  Setup Time [id number]   Final    Colony Count NO GROWTH   Final    Culture NO GROWTH   Final    Report Status 08/15/2011 FINAL   Final   MRSA PCR SCREENING     Status: Normal   Collection Time   08/13/11 11:08 PM      Component Value Range Status Comment   MRSA by PCR NEGATIVE  NEGATIVE  Final    Studies/Results: Mr Brain Wo Contrast  08/19/2011  *RADIOLOGY REPORT*  Clinical Data: Recent cervical fusion 07/21/2011.  Recent onset of confusion.  MRI HEAD WITHOUT CONTRAST  Technique:  Multiplanar, multiecho pulse sequences of the brain and surrounding structures were obtained according to standard protocol without intravenous contrast.  Comparison: CT head 08/13/2011.  Also 05/11/2011.  Findings: No acute stroke, intracranial hemorrhage, mass lesion, hydrocephalus, or extra-axial fluid.  There is advanced cerebral and cerebellar atrophy with prominence of the ventricles, cisterns, and sulci.  Focal and confluent hyperintensity of the periventricular and subcortical white matter is  consistent with chronic microvascular ischemic change from the patient's diabetes and hypertension.  The major intracranial vascular structures are patent.  Mild chronic sinus disease.  Previous left cataract extraction.  Susceptibility artifact from previous cervical anterior and posterior fusions with no craniocervical junction impingement.  No evidence for vertebral artery occlusion.  Mild dependent fluid in the mastoids without clear-cut signs of acute mastoid inflammatory process.  No evidence for transverse sinus or major dural venous sinus occlusion.  Compared with prior CT scans, most recent 08/13/2011, there is little change.  IMPRESSION: Advanced cerebral and cerebellar atrophy.  Widespread chronic microvascular ischemic change.  No acute intracranial  abnormality.  Original Report Authenticated By: Elsie Stain, M.D.   Medications: Scheduled Meds:    . carvedilol  12.5 mg Oral BID WC  . cefTRIAXone (ROCEPHIN)  IV  1 g Intravenous QHS  . clopidogrel  75 mg Oral Q breakfast  . docusate sodium  100 mg Oral BID  . enoxaparin  1 mg/kg (Adjusted) Subcutaneous Q12H  . insulin aspart  0-15 Units Subcutaneous TID WC  . insulin detemir  15 Units Subcutaneous QHS  . lidocaine  1 patch Transdermal Q24H  . niacin  1,000 mg Oral BID  . pantoprazole  40 mg Oral q morning - 10a  . rosuvastatin  20 mg Oral Daily  . senna  1 tablet Oral BID  . Tamsulosin HCl  0.4 mg Oral QHS  . warfarin  3 mg Oral ONCE-1800  . warfarin  3 mg Oral ONCE-1800  . DISCONTD: niacin  1,000 mg Oral BID   Continuous Infusions:    . sodium chloride 50 mL/hr at 08/19/11 0613   PRN Meds:.acetaminophen, acetaminophen, albuterol, ketorolac, morphine, ondansetron (ZOFRAN) IV, ondansetron, oxyCODONE Assessment/Plan: Present on Admission:  1. UTI with sepsis syndrome  - Continue Ceftriaxone D7/10 for now, follow and recheck wbc -urine cultures negative to date.  2. Confusion/Metabolic Encephalopathy - Likely subacute infections/toxic metabolic encephalopathy,  - CLINICAL improvement per family. so far with treatment of above, B12 WNL, folate WNL- RPR WNL, tsh WNL,  Ammonia WNL and follow -CT head neg on admit  -also xanax discontinued on admit as requested.  MRI of head negative for acute abnormality. Follow.  3. ARF:  Resolved with hydration 4. DVT  Extensive left leg DVT, likely due to poor mobility at SNF and recent surgery.  - risk / benefit conversation per admitting M.D. with family and pt and they elected for anticoagulation.   -guaic is negative (has new anemia). No current evidence of pulmonary embolism.  -continue Coumadin, lovenox bridge. D/C lovenox after today's dose.  5. Anemia:  -Hemoglobin remaining stable. -Anemia panel consistent with chronic  disease anemia Admission hgb likely down due to peripoperative blood loss. Guaic negative as above.  6. diabetes mellitus, uncontrolled:  will continue his detemir and give moderate SSI   7. ? urinary retention: -Resolved, Likely secondary to BPH - continue flomax,   8. CAD s/p CABG:  Chest pain free, continue plavix, statin, niacin.  coreg resumed. Holding lisinopril for now.   9.s/p cervial spine surgery, appreciate NS f/u for staple removal and local incision care recommendations on 2/11. Ortho tech to reasses cervical collar.  10. Hypokalemia -replace K    LOS: 6 days   Dawna Jakes 08/19/2011, 1:53 PM [phone]

## 2011-08-19 NOTE — Progress Notes (Addendum)
CSW visited with the pt and his sisters. CSW inquired per MD as to whether or not the pt was at his baseline, and they both report "yes" and they commented on how much better he was doing today cognitively. CSW informed the pt and family that [facility] has made a bed offer, and now they are requesting CSW seek a placement in [facility]. CSW explained the importance of making a decision, as their is a possibility that the pt may discharge by the in a day or so. They report they understand. CSW will fax the pt out to the requested facility and follow up with the pt and his family. CSW will continue to follow and offer support.  Patrice Paradise, LCSWA 08/19/2011 1:14 PM [phone]

## 2011-08-20 DIAGNOSIS — G9340 Encephalopathy, unspecified: Secondary | ICD-10-CM | POA: Diagnosis present

## 2011-08-20 DIAGNOSIS — R339 Retention of urine, unspecified: Secondary | ICD-10-CM

## 2011-08-20 LAB — GLUCOSE, CAPILLARY
Glucose-Capillary: 140 mg/dL — ABNORMAL HIGH (ref 70–99)
Glucose-Capillary: 247 mg/dL — ABNORMAL HIGH (ref 70–99)

## 2011-08-20 LAB — BASIC METABOLIC PANEL
CO2: 25 mEq/L (ref 19–32)
Calcium: 7.8 mg/dL — ABNORMAL LOW (ref 8.4–10.5)
Chloride: 101 mEq/L (ref 96–112)
Glucose, Bld: 142 mg/dL — ABNORMAL HIGH (ref 70–99)
Sodium: 135 mEq/L (ref 135–145)

## 2011-08-20 LAB — CBC
HCT: 26.2 % — ABNORMAL LOW (ref 39.0–52.0)
Hemoglobin: 8.9 g/dL — ABNORMAL LOW (ref 13.0–17.0)
WBC: 10 10*3/uL (ref 4.0–10.5)

## 2011-08-20 LAB — PROTIME-INR: INR: 2.41 — ABNORMAL HIGH (ref 0.00–1.49)

## 2011-08-20 MED ORDER — WARFARIN SODIUM 3 MG PO TABS
3.0000 mg | ORAL_TABLET | Freq: Once | ORAL | Status: AC
  Administered 2011-08-20: 3 mg via ORAL
  Filled 2011-08-20: qty 1

## 2011-08-20 MED ORDER — DEXTROSE 5 % IV SOLN
1.0000 g | Freq: Every day | INTRAVENOUS | Status: DC
  Administered 2011-08-20: 1 g via INTRAVENOUS
  Filled 2011-08-20: qty 10

## 2011-08-20 MED ORDER — POTASSIUM CHLORIDE CRYS ER 20 MEQ PO TBCR
40.0000 meq | EXTENDED_RELEASE_TABLET | ORAL | Status: AC
  Administered 2011-08-20 (×2): 40 meq via ORAL
  Filled 2011-08-20 (×2): qty 2

## 2011-08-20 MED ORDER — WARFARIN SODIUM 3 MG PO TABS: 3.0000 mg | ORAL_TABLET | Freq: Every day | ORAL | Status: DC

## 2011-08-20 MED ORDER — OXYCODONE-ACETAMINOPHEN 7.5-325 MG PO TABS: 1.0000 | ORAL_TABLET | ORAL | Status: DC | PRN

## 2011-08-20 MED ORDER — LIDOCAINE 5 % EX PTCH: 1.0000 | MEDICATED_PATCH | CUTANEOUS | Status: DC

## 2011-08-20 NOTE — Progress Notes (Signed)
ANTICOAGULATION CONSULT NOTE - Follow Up Consult  Pharmacy Consult for Warfarin  Indication: DVT  No Known Allergies  Patient Measurements: Height: 6' (182.9 cm) Weight: 186 lb 1.1 oz (84.4 kg) IBW/kg (Calculated) : 77.6   Vital Signs: Temp: 98.9 F (37.2 C) (02/15 0556) Temp src: Oral (02/15 0556) BP: 158/78 mmHg (02/15 0556) Pulse Rate: 92  (02/15 0556)  Labs:  Basename 08/20/11 0410 08/19/11 0420 08/18/11 0430  HGB 8.9* 8.9* --  HCT 26.2* 25.5* 27.0*  PLT 316 296 337  APTT -- -- --  LABPROT 26.6* 25.7* 23.2*  INR 2.41* 2.30* 2.02*  HEPARINUNFRC -- -- --  CREATININE 0.82 0.75 0.72  CKTOTAL -- -- --  CKMB -- -- --  TROPONINI -- -- --   Estimated Creatinine Clearance: 89.4 ml/min (by C-G formula based on Cr of 0.82).   Medications:  Scheduled:     . carvedilol  12.5 mg Oral BID WC  . cefTRIAXone (ROCEPHIN)  IV  1 g Intravenous QHS  . clopidogrel  75 mg Oral Q breakfast  . docusate sodium  100 mg Oral BID  . enoxaparin  1 mg/kg (Adjusted) Subcutaneous Q12H  . insulin aspart  0-15 Units Subcutaneous TID WC  . insulin detemir  15 Units Subcutaneous QHS  . lidocaine  1 patch Transdermal Q24H  . niacin  1,000 mg Oral BID  . pantoprazole  40 mg Oral q morning - 10a  . potassium chloride  40 mEq Oral Q4H  . rosuvastatin  20 mg Oral Daily  . senna  1 tablet Oral BID  . Tamsulosin HCl  0.4 mg Oral QHS  . warfarin  3 mg Oral ONCE-1800   Infusions:     . sodium chloride 50 mL/hr at 08/19/11 1610   PRN: acetaminophen, acetaminophen, albuterol, ketorolac, morphine, ondansetron (ZOFRAN) IV, ondansetron, oxyCODONE  Assessment: 71 yo M with LLE DVT who has now completed 5 day minimum overlap with warfarin and Lovenox. Lovenox was d/c'd after 2/14 doses. Bloody neck dressing noted 2/10pm RN notes. Small amount of serosanguineous drainage on the upper portion of the dressing noted in neuro MD's note from 2/11. No further bleeding events noted. INR remains therapeutic.  Unable to counsel patient due to confusion.  Goal of Therapy:  INR 2-3   Plan:  1)  Repeat Warfarin 3mg  PO x1 at 18:00  Annia Belt [phone] 08/20/2011,11:26 AM

## 2011-08-20 NOTE — Discharge Summary (Signed)
Discharge Summary  Roy Hickman MR#: [id number]  DOB:Jun 23, 1941  Date of Admission: 08/13/2011 Date of Discharge: 08/20/2011  Patient's PCP: Roy Lanius, PA, PA  Attending Physician:Roy Hickman  Consults:   1 neurosurgery: Dr. Newell Hickman  Discharge Diagnoses: Urosepsis Present on Admission:  .Urosepsis .Acute renal failure .DVT of leg (deep venous thrombosis) .Anemia .CAD (coronary artery disease) .DM2 (diabetes mellitus, type 2) .Encephalopathy   Brief Admitting History and Physical 70yoM with h/o CAD s/p CABG, DM, prior neck and back surgeries, and recent 07/2011 posterior  decompression/fusion presents with likely SIRS/urinary source, possibly evolving urosepsis,  ARF, extensive LLE DVT, worsened anemia.  Pt was last admitted by [name] in 05/2010, and set up for outpt cervical myelogram, which showed  severe stenosis from C3-C7 and T1, and so admitted by Dr. Jeral Hickman on 07/21/2011 for posterior  decompression and fusion. He was discharged 1/22 to a SNF. Two sisters state he was discharged  to [facility] and has not done well there. Has been talking out of his head, not eating  drinking, falling out of his wheelchair and falling off the bed or chair. Left the hospital  with a Foley and was left in a few weeks, until just a few days ago when it was taken out and  he had problems urinating, so he was in and out cathed. He feels like he has to pee all the  time, but cannot get anything out. At [facility], they noted he had a UTI and had treated him  with Bactrim, but then changed to Cipro jsut a few days ago because they thought the infection  was "more serious."  In the ED, he was initially hypotensive to 90/50 but this improved quickly with IVF's to  114/51, pulse 102-111, afebrile. Labs with ARF 43/2.45, glucose 231. Lactate 1.5. WBC 16.0 with  mild left shift. Hct 28.1. UA positive for infection with mod LE/pos nitrite, but 3-6 WBC and  few bacteria. UCx  pending. Trop POC negative. CXR nothing active. CT head nothing active, and  the part of surgical site visualized appeared normal, but not fully imaged. Pt also noted to  have gross LLE swelling, and doppler confirmed LLE DVT in common femoral, femoral, profunda  femorral, popliteal, posterior tibial, peroneal, saphenofemoral jxn, greater saphenous, lesser  saphenous.  No known fevers, chills, sweats, feeling systemically ill. No cardiopulmonary symptoms, no GI  symptoms. Neck has been doing well and there's no complaint on that end. When told abt the DVT,  neither he nor the sisters had noticed the leg is swollen. Pt has not been walking much. ROS otherwise unremarkable. For the rest of admission history and physical please see H&P dictated by Dr. Kaylyn Hickman.   Discharge Medications Medication List  As of 08/20/2011  3:31 PM   START taking these medications         warfarin 3 MG tablet   Commonly known as: COUMADIN   Take 1 tablet (3 mg total) by mouth daily.         CHANGE how you take these medications         oxyCODONE-acetaminophen 7.5-325 MG per tablet   Commonly known as: PERCOCET   Take 1 tablet by mouth every 4 (four) hours as needed. For pain.   What changed: how often to take the med         CONTINUE taking these medications         ALPRAZolam 1 MG tablet   Commonly known as: [name]  atorvastatin 40 MG tablet   Commonly known as: LIPITOR      carvedilol 12.5 MG tablet   Commonly known as: COREG      clopidogrel 75 MG tablet   Commonly known as: PLAVIX      cyclobenzaprine 10 MG tablet   Commonly known as: FLEXERIL      Fish Oil 500 MG Caps      folic acid 1 MG tablet   Commonly known as: [name]      glipiZIDE 5 MG 24 hr tablet   Commonly known as: [name]      hydrOXYzine 50 MG tablet   Commonly known as: [name]      ibuprofen 200 MG tablet   Commonly known as: ADVIL,MOTRIN      insulin detemir 100 UNIT/ML injection   Commonly known  as: [name]      lidocaine 5 %   Commonly known as: [name]   Place 1 patch onto the skin See admin instructions. Apply to affected area on 12 hours and off 12 hours. Apply patch at 0800 and remove at 2000 pm.      lisinopril 10 MG tablet   Commonly known as: [name],[name]      loratadine 10 MG tablet   Commonly known as: CLARITIN      metFORMIN 1000 MG tablet   Commonly known as: [name]      niacin 1000 MG CR tablet   Commonly known as: [name]      pantoprazole 20 MG tablet   Commonly known as: PROTONIX      ranitidine 300 MG tablet   Commonly known as: [name]      Tamsulosin HCl 0.4 MG Caps   Commonly known as: FLOMAX         STOP taking these medications         ciprofloxacin 500 MG tablet          Where to get your medications    These are the prescriptions that you need to pick up.   You may get these medications from any pharmacy.         lidocaine 5 %   oxyCODONE-acetaminophen 7.5-325 MG per tablet   warfarin 3 MG tablet           Hospital Course: Urosepsis Patient was admitted with SIRS criteria of tachycardia leukocytosis borderline hypotension which improved with fluids. It was felt that patient was a urosepsis as urinalysis which was done seem to be the source of his infection. Urine cultures were ordered patient was placed on empirically on IV Rocephin and IV fluids and follow. Patient was monitored patient improved slowly but clinically during this hospitalization. Patient's blood pressure responded to IV fluids his leukocytosis improved during the hospitalization and patient remained afebrile. Urine cultures came back with no growth however due to patient's presentation he was treated for total 7-8 days of IV antibiotic treatment. Patient is in stable and improved condition and will followup as stated above.  Problem #2 metabolic encephalopathy/confusion Patient was admitted with worsening confusion from his baseline. Patient's Xanax was  discontinued that he been given at the skilled nursing facility. Head CT which was obtained was negative. MRI of the head which was also obtained was negative for any acute abnormality however did show advanced cerebral and cerebellar atrophy as well as widespread chronic microvascular ischemic change. It was felt patient's acute metabolic encephalopathy was likely secondary to urinary tract infection. Urine cultures which were obtained showed no growth.  Patient was treated with IV Rocephin for 7-8 days of therapy. Patient improved clinically and was essentially at his baseline by day of discharge. Patient be discharged in stable and improved condition.  Problem #3 acute renal failure On admission it was felt that patient was in acute renal failure secondary to a prerenal azotemia. Patient was hydrated with IV fluids with resolution of his acute renal failure.  Problem #4 extensive left lower extremity DVT. On admission patient was noted to have left lower extremity swelling Dopplers which were done with consistent with a left lower extremity DVT extensive in nature in the common femoral femoral or profunda femoral popliteal posterior tibial perineal and saphenofemoral junction greater saphenous and lesser saphenous veins. Patient was placed on the full dose Lovenox which was bridged and patient was started on Coumadin. Lovenox and Coumadin were overlapped for 5 days patient did remain in the therapeutic range on both Lovenox and Coumadin for 48 hours. Patient has been stable patient will be discharged to the skilled nursing facility on 3 mg of Coumadin. Patient will need a PT INR checked on Monday, 08/23/2011.  Problem #5 Questionable urinary retention June the hospitalization patient was noted to have some urinary retention felt likely secondary to BPH. Patient was continued on his home regimen of Flomax and hydrated with IV fluids. Patient's urinary retention improved and had resolved by day of  discharge.  Problem #6 Status post C3-T1 posterior cervical laminotomy and C3-C7 posterior cervical arthrodesis Patient was status post cervical surgery. A half weeks prior to admission. Due to his recent surgery and wound site a neurosurgical consultation was obtained. Patient was seen in consultation by Dr. Newell Hickman on 08/16/2011. Patient's wound site was looked at his dressing was changed staples removed. Patient was placed back in the cervical neck collar. With daily dressing changes. Patient is to followup with Dr. Jeral Hickman one week post discharge.  Problem #7 anemia During the hospitalization patient was noted to be anemic. Patient did not have any overt GI bleed. Anemia panel which was done was consistent with anemia of chronic disease. It was felt that patient's anemia was likely secondary to perioperative blood loss during his prior hospitalization. Stool guaiacs which were done were negative. Patient's hemoglobin remained stable throughout the hospitalization and will need to be followed up upon as outpatient. The rest of patient's chronic medical issues remained stable throughout the hospitalization.  Present on Admission:  .Urosepsis .Acute renal failure .DVT of leg (deep venous thrombosis) .Anemia .CAD (coronary artery disease) .DM2 (diabetes mellitus, type 2) .Encephalopathy   Day of Discharge BP 126/78  Pulse 89  Temp(Src) 97.8 F (36.6 C) (Oral)  Resp 15  Ht 6' (1.829 m)  Wt 84.4 kg (186 lb 1.1 oz)  BMI 25.24 kg/m2  SpO2 97% Subjective: No complaints. Family at bedside to patient's mental status is improved and close to his baseline. General: Alert, awake, oriented x3, in no acute distress. In cervical collar. Heart: Regular rate and rhythm, without murmurs, rubs, gallops. Lungs: Clear to auscultation bilaterally anterior lung fields Abdomen: Soft, nontender, nondistended, positive bowel sounds. Extremities: No clubbing cyanosis or edema with positive pedal  pulses.   Results for orders placed during the hospital encounter of 08/13/11 (from the past 48 hour(s))  GLUCOSE, CAPILLARY     Status: Abnormal   Collection Time   08/18/11  5:01 PM      Component Value Range Comment   Glucose-Capillary 158 (*) 70 - 99 (mg/dL)   GLUCOSE, CAPILLARY  Status: Abnormal   Collection Time   08/18/11  9:58 PM      Component Value Range Comment   Glucose-Capillary 205 (*) 70 - 99 (mg/dL)   PROTIME-INR     Status: Abnormal   Collection Time   08/19/11  4:20 AM      Component Value Range Comment   Prothrombin Time 25.7 (*) 11.6 - 15.2 (seconds)    INR 2.30 (*) 0.00 - 1.49    BASIC METABOLIC PANEL     Status: Abnormal   Collection Time   08/19/11  4:20 AM      Component Value Range Comment   Sodium 138  135 - 145 (mEq/L)    Potassium 3.6  3.5 - 5.1 (mEq/L)    Chloride 105  96 - 112 (mEq/L)    CO2 23  19 - 32 (mEq/L)    Glucose, Bld 123 (*) 70 - 99 (mg/dL)    BUN 9  6 - 23 (mg/dL)    Creatinine, Ser 4.78  0.50 - 1.35 (mg/dL)    Calcium 8.0 (*) 8.4 - 10.5 (mg/dL)    GFR calc non Af Amer 89 (*) >90 (mL/min)    GFR calc Af Amer >90  >90 (mL/min)   CBC     Status: Abnormal   Collection Time   08/19/11  4:20 AM      Component Value Range Comment   WBC 10.9 (*) 4.0 - 10.5 (K/uL)    RBC 2.72 (*) 4.22 - 5.81 (MIL/uL)    Hemoglobin 8.9 (*) 13.0 - 17.0 (g/dL)    HCT 29.5 (*) 62.1 - 52.0 (%)    MCV 93.8  78.0 - 100.0 (fL)    MCH 32.7  26.0 - 34.0 (pg)    MCHC 34.9  30.0 - 36.0 (g/dL)    RDW 30.8  65.7 - 84.6 (%)    Platelets 296  150 - 400 (K/uL)   GLUCOSE, CAPILLARY     Status: Abnormal   Collection Time   08/19/11  8:38 AM      Component Value Range Comment   Glucose-Capillary 140 (*) 70 - 99 (mg/dL)   GLUCOSE, CAPILLARY     Status: Abnormal   Collection Time   08/19/11 11:43 AM      Component Value Range Comment   Glucose-Capillary 214 (*) 70 - 99 (mg/dL)   GLUCOSE, CAPILLARY     Status: Abnormal   Collection Time   08/19/11  5:01 PM       Component Value Range Comment   Glucose-Capillary 169 (*) 70 - 99 (mg/dL)   GLUCOSE, CAPILLARY     Status: Abnormal   Collection Time   08/19/11  9:24 PM      Component Value Range Comment   Glucose-Capillary 174 (*) 70 - 99 (mg/dL)    Comment 1 Notify RN      Comment 2 Documented in Chart     PROTIME-INR     Status: Abnormal   Collection Time   08/20/11  4:10 AM      Component Value Range Comment   Prothrombin Time 26.6 (*) 11.6 - 15.2 (seconds)    INR 2.41 (*) 0.00 - 1.49    BASIC METABOLIC PANEL     Status: Abnormal   Collection Time   08/20/11  4:10 AM      Component Value Range Comment   Sodium 135  135 - 145 (mEq/L)    Potassium 3.2 (*) 3.5 - 5.1 (mEq/L)  Chloride 101  96 - 112 (mEq/L)    CO2 25  19 - 32 (mEq/L)    Glucose, Bld 142 (*) 70 - 99 (mg/dL)    BUN 6  6 - 23 (mg/dL)    Creatinine, Ser 9.52  0.50 - 1.35 (mg/dL)    Calcium 7.8 (*) 8.4 - 10.5 (mg/dL)    GFR calc non Af Amer 86 (*) >90 (mL/min)    GFR calc Af Amer >90  >90 (mL/min)   CBC     Status: Abnormal   Collection Time   08/20/11  4:10 AM      Component Value Range Comment   WBC 10.0  4.0 - 10.5 (K/uL)    RBC 2.81 (*) 4.22 - 5.81 (MIL/uL)    Hemoglobin 8.9 (*) 13.0 - 17.0 (g/dL)    HCT 84.1 (*) 32.4 - 52.0 (%)    MCV 93.2  78.0 - 100.0 (fL)    MCH 31.7  26.0 - 34.0 (pg)    MCHC 34.0  30.0 - 36.0 (g/dL)    RDW 40.1  02.7 - 25.3 (%)    Platelets 316  150 - 400 (K/uL)   GLUCOSE, CAPILLARY     Status: Abnormal   Collection Time   08/20/11  8:03 AM      Component Value Range Comment   Glucose-Capillary 140 (*) 70 - 99 (mg/dL)   GLUCOSE, CAPILLARY     Status: Abnormal   Collection Time   08/20/11 12:23 PM      Component Value Range Comment   Glucose-Capillary 247 (*) 70 - 99 (mg/dL)     Dg Chest 2 View  12/08/4401  *RADIOLOGY REPORT*  Clinical Data: Coronary artery disease, diabetes  CHEST - 2 VIEW  Comparison: Chest x-ray of 06/22/2011  Findings: No active infiltrate or effusion is seen.  There is a  poorly defined nodular opacity at the left lung base near the costophrenic angle which was present on chest x-ray of 2011 and therefore most likely is benign.  Mediastinal contours are stable. Median sternotomy sutures are noted from prior CABG.  Hardware for posterior and anterior fusion of the cervical spine is noted.  The heart is within normal in size.  The bones are diffusely osteopenic with degenerative change.  IMPRESSION: No active lung disease.  Original Report Authenticated By: Juline Patch, M.D.   Ct Head Wo Contrast  08/13/2011  *RADIOLOGY REPORT*  Clinical Data: Elevated white blood cell count.  Effusion.  Fall.  CT HEAD WITHOUT CONTRAST  Technique:  Contiguous axial images were obtained from the base of the skull through the vertex without contrast.  Comparison: Head CT 05/11/2011  Findings:  No acute intracranial hemorrhage.  No focal mass lesion. No CT evidence of acute infarction.   No midline shift or mass effect.  No hydrocephalus.  Basilar cisterns are patent.  Generalized cortical atrophy unchanged from prior.  Periventricular and subcortical white matter hypodensities which are also unchanged from prior.  Surgical site in the posterior neck consistent recent cervical fusion.  No fluid collection evident.  IMPRESSION: 1.  No acute intracranial findings. 2.  Atrophy and microvascular disease are not changed from prior. 3.  Surgical site not fully imaged but does not appear complicated.  Original Report Authenticated By: Genevive Bi, M.D.   Mr Brain Wo Contrast  08/19/2011  *RADIOLOGY REPORT*  Clinical Data: Recent cervical fusion 07/21/2011.  Recent onset of confusion.  MRI HEAD WITHOUT CONTRAST  Technique:  Multiplanar, multiecho pulse sequences  of the brain and surrounding structures were obtained according to standard protocol without intravenous contrast.  Comparison: CT head 08/13/2011.  Also 05/11/2011.  Findings: No acute stroke, intracranial hemorrhage, mass lesion,  hydrocephalus, or extra-axial fluid.  There is advanced cerebral and cerebellar atrophy with prominence of the ventricles, cisterns, and sulci.  Focal and confluent hyperintensity of the periventricular and subcortical white matter is consistent with chronic microvascular ischemic change from the patient's diabetes and hypertension.  The major intracranial vascular structures are patent.  Mild chronic sinus disease.  Previous left cataract extraction.  Susceptibility artifact from previous cervical anterior and posterior fusions with no craniocervical junction impingement.  No evidence for vertebral artery occlusion.  Mild dependent fluid in the mastoids without clear-cut signs of acute mastoid inflammatory process.  No evidence for transverse sinus or major dural venous sinus occlusion.  Compared with prior CT scans, most recent 08/13/2011, there is little change.  IMPRESSION: Advanced cerebral and cerebellar atrophy.  Widespread chronic microvascular ischemic change.  No acute intracranial abnormality.  Original Report Authenticated By: Elsie Stain, M.D.     Disposition: Skilled nursing facility at the [facility]  Diet: Low sodium heart healthy  Activity: Increase activity slowly/per SNF   Follow-up Appts: Discharge Orders    Future Orders Please Complete By Expires   Diet - low sodium heart healthy      Increase activity slowly      Discharge instructions      Comments:   Follow up with MD at SNF. Will need PT/INR on Monday 08/23/11 Remove cervical collar and change dressing on neck daily and replace cervical neck collar after dressing change.      TESTS THAT NEED FOLLOW-UP PT/INR check on Monday 08/23/11. CBC in one week.  Time spent on discharge, talking to the patient, and coordinating care: 60 mins.   SignedRamiro Harvest 08/20/2011, 3:31 PM

## 2012-09-22 ENCOUNTER — Ambulatory Visit: Payer: Self-pay | Admitting: Cardiovascular Disease

## 2012-09-22 DIAGNOSIS — I82409 Acute embolism and thrombosis of unspecified deep veins of unspecified lower extremity: Secondary | ICD-10-CM | POA: Insufficient documentation

## 2012-09-22 DIAGNOSIS — Z7901 Long term (current) use of anticoagulants: Secondary | ICD-10-CM | POA: Insufficient documentation

## 2012-11-14 ENCOUNTER — Ambulatory Visit (INDEPENDENT_AMBULATORY_CARE_PROVIDER_SITE_OTHER): Payer: Self-pay | Admitting: Pharmacist Clinician (PhC)/ Clinical Pharmacy Specialist

## 2012-11-14 DIAGNOSIS — I82409 Acute embolism and thrombosis of unspecified deep veins of unspecified lower extremity: Secondary | ICD-10-CM

## 2012-11-14 DIAGNOSIS — Z7901 Long term (current) use of anticoagulants: Secondary | ICD-10-CM

## 2012-11-24 ENCOUNTER — Ambulatory Visit (INDEPENDENT_AMBULATORY_CARE_PROVIDER_SITE_OTHER): Payer: Self-pay | Admitting: Pharmacist Clinician (PhC)/ Clinical Pharmacy Specialist

## 2012-11-24 DIAGNOSIS — Z7901 Long term (current) use of anticoagulants: Secondary | ICD-10-CM

## 2012-11-24 DIAGNOSIS — I82409 Acute embolism and thrombosis of unspecified deep veins of unspecified lower extremity: Secondary | ICD-10-CM

## 2012-12-06 ENCOUNTER — Ambulatory Visit (INDEPENDENT_AMBULATORY_CARE_PROVIDER_SITE_OTHER): Payer: Self-pay | Admitting: Pharmacist Clinician (PhC)/ Clinical Pharmacy Specialist

## 2012-12-06 DIAGNOSIS — Z7901 Long term (current) use of anticoagulants: Secondary | ICD-10-CM

## 2012-12-06 DIAGNOSIS — I82409 Acute embolism and thrombosis of unspecified deep veins of unspecified lower extremity: Secondary | ICD-10-CM

## 2012-12-12 ENCOUNTER — Ambulatory Visit (INDEPENDENT_AMBULATORY_CARE_PROVIDER_SITE_OTHER): Payer: Self-pay | Admitting: Pharmacist Clinician (PhC)/ Clinical Pharmacy Specialist

## 2012-12-12 DIAGNOSIS — Z7901 Long term (current) use of anticoagulants: Secondary | ICD-10-CM

## 2012-12-12 DIAGNOSIS — I82409 Acute embolism and thrombosis of unspecified deep veins of unspecified lower extremity: Secondary | ICD-10-CM

## 2012-12-19 ENCOUNTER — Ambulatory Visit (INDEPENDENT_AMBULATORY_CARE_PROVIDER_SITE_OTHER): Payer: Self-pay | Admitting: Pharmacist Clinician (PhC)/ Clinical Pharmacy Specialist

## 2012-12-19 DIAGNOSIS — I82409 Acute embolism and thrombosis of unspecified deep veins of unspecified lower extremity: Secondary | ICD-10-CM

## 2012-12-19 DIAGNOSIS — Z7901 Long term (current) use of anticoagulants: Secondary | ICD-10-CM

## 2012-12-26 LAB — POCT INR: INR: 3.9

## 2012-12-27 ENCOUNTER — Ambulatory Visit (INDEPENDENT_AMBULATORY_CARE_PROVIDER_SITE_OTHER): Payer: Self-pay | Admitting: Pharmacist Clinician (PhC)/ Clinical Pharmacy Specialist

## 2012-12-27 DIAGNOSIS — I82409 Acute embolism and thrombosis of unspecified deep veins of unspecified lower extremity: Secondary | ICD-10-CM

## 2012-12-27 DIAGNOSIS — Z7901 Long term (current) use of anticoagulants: Secondary | ICD-10-CM

## 2012-12-28 ENCOUNTER — Telehealth: Payer: Self-pay | Admitting: Pharmacist Clinician (PhC)/ Clinical Pharmacy Specialist

## 2012-12-28 NOTE — Telephone Encounter (Signed)
Pt sister called - he was prescribed fluconazole 150mg  qd x 10 days for "jock itch" - was told will interact with both warfarin and atorvastatin She wondered about 10 day rx when only need x 1 day for women and yeast infections.  Pt takes weekly INRs - warfarin already held Tuesday for INR 3.9.  Advised to hold warfarin again tonight as well as atrovastatin then resume both tomorrow.   Suggested to her to give fluconazole x 3 days then see if he has improvement before continuing med.

## 2013-01-02 ENCOUNTER — Ambulatory Visit (INDEPENDENT_AMBULATORY_CARE_PROVIDER_SITE_OTHER): Payer: Self-pay | Admitting: Pharmacist Clinician (PhC)/ Clinical Pharmacy Specialist

## 2013-01-02 DIAGNOSIS — I82409 Acute embolism and thrombosis of unspecified deep veins of unspecified lower extremity: Secondary | ICD-10-CM

## 2013-01-02 DIAGNOSIS — Z7901 Long term (current) use of anticoagulants: Secondary | ICD-10-CM

## 2013-01-02 LAB — POCT INR: INR: 2.6

## 2013-01-09 ENCOUNTER — Ambulatory Visit (INDEPENDENT_AMBULATORY_CARE_PROVIDER_SITE_OTHER): Payer: Self-pay | Admitting: Pharmacist Clinician (PhC)/ Clinical Pharmacy Specialist

## 2013-01-09 ENCOUNTER — Telehealth: Payer: Self-pay | Admitting: *Deleted

## 2013-01-09 DIAGNOSIS — I82409 Acute embolism and thrombosis of unspecified deep veins of unspecified lower extremity: Secondary | ICD-10-CM

## 2013-01-09 DIAGNOSIS — Z7901 Long term (current) use of anticoagulants: Secondary | ICD-10-CM

## 2013-01-09 LAB — POCT INR: INR: 6.4

## 2013-01-09 NOTE — Telephone Encounter (Signed)
Maggie w/ lab called in critical PT/INR 6.4.  Message forwarded to K. Alvstad, PharmD.

## 2013-01-12 ENCOUNTER — Ambulatory Visit (INDEPENDENT_AMBULATORY_CARE_PROVIDER_SITE_OTHER): Payer: Self-pay | Admitting: Pharmacist Clinician (PhC)/ Clinical Pharmacy Specialist

## 2013-01-12 ENCOUNTER — Telehealth: Payer: Self-pay | Admitting: Pharmacist Clinician (PhC)/ Clinical Pharmacy Specialist

## 2013-01-12 DIAGNOSIS — I82409 Acute embolism and thrombosis of unspecified deep veins of unspecified lower extremity: Secondary | ICD-10-CM

## 2013-01-12 DIAGNOSIS — Z7901 Long term (current) use of anticoagulants: Secondary | ICD-10-CM

## 2013-01-12 LAB — POCT INR: INR: 2.7

## 2013-01-12 NOTE — Telephone Encounter (Signed)
Pt wanted to let you know that his INR is 2.7 today.

## 2013-01-12 NOTE — Telephone Encounter (Signed)
See anti-coag encounter - no longer using fluconazole tabs

## 2013-01-16 ENCOUNTER — Ambulatory Visit (INDEPENDENT_AMBULATORY_CARE_PROVIDER_SITE_OTHER): Payer: Self-pay | Admitting: Pharmacist Clinician (PhC)/ Clinical Pharmacy Specialist

## 2013-01-16 DIAGNOSIS — I82409 Acute embolism and thrombosis of unspecified deep veins of unspecified lower extremity: Secondary | ICD-10-CM

## 2013-01-16 DIAGNOSIS — Z7901 Long term (current) use of anticoagulants: Secondary | ICD-10-CM

## 2013-01-16 LAB — POCT INR: INR: 1.9

## 2013-01-23 ENCOUNTER — Ambulatory Visit (INDEPENDENT_AMBULATORY_CARE_PROVIDER_SITE_OTHER): Payer: Self-pay | Admitting: Pharmacist Clinician (PhC)/ Clinical Pharmacy Specialist

## 2013-01-23 DIAGNOSIS — I82409 Acute embolism and thrombosis of unspecified deep veins of unspecified lower extremity: Secondary | ICD-10-CM

## 2013-01-23 DIAGNOSIS — Z7901 Long term (current) use of anticoagulants: Secondary | ICD-10-CM

## 2013-02-01 ENCOUNTER — Ambulatory Visit (INDEPENDENT_AMBULATORY_CARE_PROVIDER_SITE_OTHER): Payer: Self-pay | Admitting: Pharmacist

## 2013-02-01 DIAGNOSIS — Z7901 Long term (current) use of anticoagulants: Secondary | ICD-10-CM

## 2013-02-01 DIAGNOSIS — I82409 Acute embolism and thrombosis of unspecified deep veins of unspecified lower extremity: Secondary | ICD-10-CM

## 2013-02-08 ENCOUNTER — Ambulatory Visit (INDEPENDENT_AMBULATORY_CARE_PROVIDER_SITE_OTHER): Payer: Self-pay | Admitting: Pharmacist

## 2013-02-08 DIAGNOSIS — I82409 Acute embolism and thrombosis of unspecified deep veins of unspecified lower extremity: Secondary | ICD-10-CM

## 2013-02-08 DIAGNOSIS — Z7901 Long term (current) use of anticoagulants: Secondary | ICD-10-CM

## 2013-02-14 ENCOUNTER — Ambulatory Visit (INDEPENDENT_AMBULATORY_CARE_PROVIDER_SITE_OTHER): Payer: Self-pay | Admitting: Pharmacist Clinician (PhC)/ Clinical Pharmacy Specialist

## 2013-02-14 DIAGNOSIS — I82409 Acute embolism and thrombosis of unspecified deep veins of unspecified lower extremity: Secondary | ICD-10-CM

## 2013-02-14 DIAGNOSIS — Z7901 Long term (current) use of anticoagulants: Secondary | ICD-10-CM

## 2013-02-19 ENCOUNTER — Encounter: Payer: Self-pay | Admitting: Cardiology

## 2013-02-19 ENCOUNTER — Ambulatory Visit (INDEPENDENT_AMBULATORY_CARE_PROVIDER_SITE_OTHER): Payer: Medicare Other | Admitting: Cardiology

## 2013-02-19 VITALS — BP 110/78 | HR 91 | Ht 72.0 in | Wt 207.2 lb

## 2013-02-19 DIAGNOSIS — E785 Hyperlipidemia, unspecified: Secondary | ICD-10-CM | POA: Insufficient documentation

## 2013-02-19 DIAGNOSIS — E119 Type 2 diabetes mellitus without complications: Secondary | ICD-10-CM

## 2013-02-19 DIAGNOSIS — I2510 Atherosclerotic heart disease of native coronary artery without angina pectoris: Secondary | ICD-10-CM

## 2013-02-19 DIAGNOSIS — I10 Essential (primary) hypertension: Secondary | ICD-10-CM

## 2013-02-19 DIAGNOSIS — I82409 Acute embolism and thrombosis of unspecified deep veins of unspecified lower extremity: Secondary | ICD-10-CM

## 2013-02-19 NOTE — Assessment & Plan Note (Signed)
No angina 

## 2013-02-19 NOTE — Progress Notes (Signed)
02/19/2013 Roy Hickman   04-30-1941  [id number]  Primary Physicia [name], NT Primary Cardiologist: Dr Allyson Sabal  HPI:  The patient is a 72 year old moderately overweight widowed Caucasian male with no children. He has a history of CAD, status post coronary artery bypass grafting, November of 2007, with a LIMA to his LAD, and vein to a diagonal branch and PDA. His EF by echo Jan 2013 was 40%-45%. Myoview Jan 2013 was low risk. He did have postoperative PAF but has been  maintaining sinus rhythm. His other problems include hyperlipidemia and diabetes as well as remote tobacco abuse.  He apparently developed a DVT post op C- spine surgery Jan 2013 and was placed on Coumadin anticoagulation. We obtained followup venous Dopplers that showed extensive left lower extremity DVT and his Coumadin was continued. He is essentially nonambulatory and is wheelchair bound. He denies any anginal symptoms. He is here with his family today for 6 month check up.    Current Outpatient Prescriptions  Medication Sig Dispense Refill  . atorvastatin (LIPITOR) 40 MG tablet Take 40 mg by mouth at bedtime.       . carvedilol (COREG) 12.5 MG tablet Take 12.5 mg by mouth 2 (two) times daily with a meal.       . clopidogrel (PLAVIX) 75 MG tablet Take 75 mg by mouth daily with breakfast.      . folic acid ([name]) 1 MG tablet Take 1 mg by mouth daily.       Marland Kitchen glipiZIDE ([name]) 5 MG 24 hr tablet Take 5 mg by mouth daily.       . hydrochlorothiazide (HYDRODIURIL) 25 MG tablet Take 25 mg by mouth daily.      . hydrOXYzine ([name]) 50 MG tablet Take 50 mg by mouth every 6 (six) hours as needed. For itching.      Marland Kitchen ibuprofen (ADVIL,MOTRIN) 200 MG tablet Take 200 mg by mouth 2 (two) times daily. Scheduled        . insulin detemir ([name]) 100 UNIT/ML injection Inject 15 Units into the skin at bedtime.      Marland Kitchen LEVOTHYROXINE SODIUM PO Take by mouth daily.      . metFORMIN ([name]) 1000 MG tablet Take 1,000  mg by mouth daily with breakfast.       . nystatin-triamcinolone (MYCOLOG II) cream as needed.      Marland Kitchen omeprazole (PRILOSEC) 20 MG capsule Take 20 mg by mouth daily.      . QUEtiapine (SEROQUEL) 50 MG tablet Take 50 mg by mouth daily.      . Tamsulosin HCl (FLOMAX) 0.4 MG CAPS Take 0.4 mg by mouth at bedtime.       Marland Kitchen warfarin (COUMADIN) 3 MG tablet Take 3 mg by mouth daily. Per INR       No current facility-administered medications for this visit.    No Known Allergies  History   Social History  . Marital Status: Widowed    Spouse Name: N/A    Number of Children: N/A  . Years of Education: N/A   Occupational History  . Not on file.   Social History Main Topics  . Smoking status: Former Smoker -- 2.00 packs/day for 25 years    Types: Cigarettes    Quit date: 02/27/1987  . Smokeless tobacco: Never Used  . Alcohol Use: No     Comment: quit drinking in 1984  . Drug Use: No  . Sexual Activity: No   Other Topics Concern  .  Not on file   Social History Narrative   Has been at [facility] [place]. Was living in an apt by himself before being at [facility] since 05/2010. Has 5 sisters, no brothers. [phone] or [phone]. Roy Hickman, [employer].      Review of Systems: General: negative for chills, fever, night sweats or weight changes.  Cardiovascular: negative for chest pain, dyspnea on exertion, edema, orthopnea, palpitations, paroxysmal nocturnal dyspnea or shortness of breath Dermatological: negative for rash Respiratory: negative for cough or wheezing Urologic: negative for hematuria Abdominal: negative for nausea, vomiting, diarrhea, bright red blood per rectum, melena, or hematemesis Neurologic: negative for visual changes, syncope, or dizziness All other systems reviewed and are otherwise negative except as noted above.    Blood pressure 110/78, pulse 91, height 6' (1.829 m), weight 207 lb 3.2 oz (93.985 kg).  General appearance: alert, cooperative and  no distress Neck: no carotid bruit and no JVD Lungs: clear to auscultation bilaterally Heart: regular rate and rhythm Extremities: no edema  EKG  NSR, NSST changes and inferior Qs, similar though more pronounced than on previous EKGs.  ASSESSMENT AND PLAN:   CAD, CABG X 3 11/07. Myoview low risk 1/13 No angina  DVT of leg (deep venous thrombosis), unspecified laterality Chronic Coumadin followed in our office  HTN (hypertension) controlled  DM2 (diabetes mellitus, type 2) .  Dyslipidemia On statin   PLAN  Same Rx. Follow up with Dr Allyson Sabal in 6 months. I asked the pt to see if Angle Jones will send copies of his labs, he is to see her tomorrow.   [facility] KPA-C 02/19/2013 3:35 PM

## 2013-02-19 NOTE — Assessment & Plan Note (Signed)
On statin.

## 2013-02-19 NOTE — Assessment & Plan Note (Signed)
Chronic Coumadin followed in our office

## 2013-02-19 NOTE — Patient Instructions (Signed)
Your physician recommends that you schedule a follow-up appointment in: 6 months with Dr Allyson Sabal Please ask [name] to send copies of you labs

## 2013-02-19 NOTE — Assessment & Plan Note (Signed)
controlled 

## 2013-02-20 ENCOUNTER — Ambulatory Visit (INDEPENDENT_AMBULATORY_CARE_PROVIDER_SITE_OTHER): Payer: Self-pay | Admitting: Pharmacist Clinician (PhC)/ Clinical Pharmacy Specialist

## 2013-02-20 DIAGNOSIS — I82409 Acute embolism and thrombosis of unspecified deep veins of unspecified lower extremity: Secondary | ICD-10-CM

## 2013-02-20 DIAGNOSIS — Z7901 Long term (current) use of anticoagulants: Secondary | ICD-10-CM

## 2013-02-20 LAB — POCT INR: INR: 3.2

## 2013-02-28 ENCOUNTER — Ambulatory Visit (INDEPENDENT_AMBULATORY_CARE_PROVIDER_SITE_OTHER): Payer: Self-pay | Admitting: Pharmacist Clinician (PhC)/ Clinical Pharmacy Specialist

## 2013-02-28 DIAGNOSIS — Z7901 Long term (current) use of anticoagulants: Secondary | ICD-10-CM

## 2013-02-28 DIAGNOSIS — I82409 Acute embolism and thrombosis of unspecified deep veins of unspecified lower extremity: Secondary | ICD-10-CM

## 2013-03-06 LAB — POCT INR: INR: 2.1

## 2013-03-07 ENCOUNTER — Ambulatory Visit (INDEPENDENT_AMBULATORY_CARE_PROVIDER_SITE_OTHER): Payer: Self-pay | Admitting: Pharmacist Clinician (PhC)/ Clinical Pharmacy Specialist

## 2013-03-07 DIAGNOSIS — I82409 Acute embolism and thrombosis of unspecified deep veins of unspecified lower extremity: Secondary | ICD-10-CM

## 2013-03-07 DIAGNOSIS — Z7901 Long term (current) use of anticoagulants: Secondary | ICD-10-CM

## 2013-03-13 LAB — POCT INR: INR: 2

## 2013-03-15 ENCOUNTER — Ambulatory Visit (INDEPENDENT_AMBULATORY_CARE_PROVIDER_SITE_OTHER): Payer: Medicare Other | Admitting: Pharmacist Clinician (PhC)/ Clinical Pharmacy Specialist

## 2013-03-15 DIAGNOSIS — Z7901 Long term (current) use of anticoagulants: Secondary | ICD-10-CM

## 2013-03-15 DIAGNOSIS — I82409 Acute embolism and thrombosis of unspecified deep veins of unspecified lower extremity: Secondary | ICD-10-CM

## 2013-03-20 ENCOUNTER — Ambulatory Visit (INDEPENDENT_AMBULATORY_CARE_PROVIDER_SITE_OTHER): Payer: Medicare Other | Admitting: Pharmacist Clinician (PhC)/ Clinical Pharmacy Specialist

## 2013-03-20 DIAGNOSIS — I82409 Acute embolism and thrombosis of unspecified deep veins of unspecified lower extremity: Secondary | ICD-10-CM

## 2013-03-20 DIAGNOSIS — Z7901 Long term (current) use of anticoagulants: Secondary | ICD-10-CM

## 2013-03-26 LAB — POCT INR: INR: 2.4

## 2013-03-27 ENCOUNTER — Ambulatory Visit (INDEPENDENT_AMBULATORY_CARE_PROVIDER_SITE_OTHER): Payer: Medicare Other | Admitting: Pharmacist Clinician (PhC)/ Clinical Pharmacy Specialist

## 2013-03-27 DIAGNOSIS — I82409 Acute embolism and thrombosis of unspecified deep veins of unspecified lower extremity: Secondary | ICD-10-CM

## 2013-03-27 DIAGNOSIS — Z7901 Long term (current) use of anticoagulants: Secondary | ICD-10-CM

## 2013-04-03 ENCOUNTER — Ambulatory Visit (INDEPENDENT_AMBULATORY_CARE_PROVIDER_SITE_OTHER): Payer: Medicare Other | Admitting: Pharmacist Clinician (PhC)/ Clinical Pharmacy Specialist

## 2013-04-03 DIAGNOSIS — Z7901 Long term (current) use of anticoagulants: Secondary | ICD-10-CM

## 2013-04-03 DIAGNOSIS — I82409 Acute embolism and thrombosis of unspecified deep veins of unspecified lower extremity: Secondary | ICD-10-CM

## 2013-04-03 LAB — POCT INR: INR: 2.8

## 2013-04-10 ENCOUNTER — Ambulatory Visit (INDEPENDENT_AMBULATORY_CARE_PROVIDER_SITE_OTHER): Payer: Medicare Other | Admitting: Pharmacist Clinician (PhC)/ Clinical Pharmacy Specialist

## 2013-04-10 DIAGNOSIS — Z7901 Long term (current) use of anticoagulants: Secondary | ICD-10-CM

## 2013-04-10 DIAGNOSIS — I82409 Acute embolism and thrombosis of unspecified deep veins of unspecified lower extremity: Secondary | ICD-10-CM

## 2013-04-10 LAB — POCT INR: INR: 2.6

## 2013-04-17 LAB — POCT INR: INR: 3.2

## 2013-04-19 ENCOUNTER — Ambulatory Visit (INDEPENDENT_AMBULATORY_CARE_PROVIDER_SITE_OTHER): Payer: Medicare Other | Admitting: Pharmacist Clinician (PhC)/ Clinical Pharmacy Specialist

## 2013-04-19 DIAGNOSIS — Z7901 Long term (current) use of anticoagulants: Secondary | ICD-10-CM

## 2013-04-19 DIAGNOSIS — I82409 Acute embolism and thrombosis of unspecified deep veins of unspecified lower extremity: Secondary | ICD-10-CM

## 2013-04-24 ENCOUNTER — Ambulatory Visit (INDEPENDENT_AMBULATORY_CARE_PROVIDER_SITE_OTHER): Payer: Medicare Other | Admitting: Pharmacist Clinician (PhC)/ Clinical Pharmacy Specialist

## 2013-04-24 DIAGNOSIS — Z7901 Long term (current) use of anticoagulants: Secondary | ICD-10-CM

## 2013-04-24 DIAGNOSIS — I82409 Acute embolism and thrombosis of unspecified deep veins of unspecified lower extremity: Secondary | ICD-10-CM

## 2013-04-24 LAB — POCT INR: INR: 2.1

## 2013-05-01 ENCOUNTER — Ambulatory Visit (INDEPENDENT_AMBULATORY_CARE_PROVIDER_SITE_OTHER): Payer: Medicare Other | Admitting: Pharmacist Clinician (PhC)/ Clinical Pharmacy Specialist

## 2013-05-01 DIAGNOSIS — Z7901 Long term (current) use of anticoagulants: Secondary | ICD-10-CM

## 2013-05-01 DIAGNOSIS — I82409 Acute embolism and thrombosis of unspecified deep veins of unspecified lower extremity: Secondary | ICD-10-CM

## 2013-05-01 LAB — POCT INR: INR: 2.2

## 2013-05-08 ENCOUNTER — Ambulatory Visit (INDEPENDENT_AMBULATORY_CARE_PROVIDER_SITE_OTHER): Payer: Medicare Other | Admitting: Pharmacist Clinician (PhC)/ Clinical Pharmacy Specialist

## 2013-05-08 DIAGNOSIS — Z7901 Long term (current) use of anticoagulants: Secondary | ICD-10-CM

## 2013-05-08 DIAGNOSIS — I82409 Acute embolism and thrombosis of unspecified deep veins of unspecified lower extremity: Secondary | ICD-10-CM

## 2013-05-08 LAB — POCT INR: INR: 2.5

## 2013-05-15 ENCOUNTER — Ambulatory Visit (INDEPENDENT_AMBULATORY_CARE_PROVIDER_SITE_OTHER): Payer: Medicare Other | Admitting: Pharmacist Clinician (PhC)/ Clinical Pharmacy Specialist

## 2013-05-15 DIAGNOSIS — Z7901 Long term (current) use of anticoagulants: Secondary | ICD-10-CM

## 2013-05-15 DIAGNOSIS — I82409 Acute embolism and thrombosis of unspecified deep veins of unspecified lower extremity: Secondary | ICD-10-CM

## 2013-05-15 LAB — POCT INR: INR: 3.7

## 2013-05-24 ENCOUNTER — Ambulatory Visit (INDEPENDENT_AMBULATORY_CARE_PROVIDER_SITE_OTHER): Payer: Medicare Other | Admitting: Pharmacist Clinician (PhC)/ Clinical Pharmacy Specialist

## 2013-05-24 DIAGNOSIS — Z7901 Long term (current) use of anticoagulants: Secondary | ICD-10-CM

## 2013-05-24 DIAGNOSIS — I82409 Acute embolism and thrombosis of unspecified deep veins of unspecified lower extremity: Secondary | ICD-10-CM

## 2013-05-29 ENCOUNTER — Ambulatory Visit (INDEPENDENT_AMBULATORY_CARE_PROVIDER_SITE_OTHER): Payer: Medicare Other | Admitting: Pharmacist Clinician (PhC)/ Clinical Pharmacy Specialist

## 2013-05-29 DIAGNOSIS — Z7901 Long term (current) use of anticoagulants: Secondary | ICD-10-CM

## 2013-05-29 DIAGNOSIS — I82409 Acute embolism and thrombosis of unspecified deep veins of unspecified lower extremity: Secondary | ICD-10-CM

## 2013-06-07 ENCOUNTER — Ambulatory Visit (INDEPENDENT_AMBULATORY_CARE_PROVIDER_SITE_OTHER): Payer: Medicare Other | Admitting: Pharmacist Clinician (PhC)/ Clinical Pharmacy Specialist

## 2013-06-07 DIAGNOSIS — I82409 Acute embolism and thrombosis of unspecified deep veins of unspecified lower extremity: Secondary | ICD-10-CM

## 2013-06-07 DIAGNOSIS — Z7901 Long term (current) use of anticoagulants: Secondary | ICD-10-CM

## 2013-06-12 ENCOUNTER — Ambulatory Visit (INDEPENDENT_AMBULATORY_CARE_PROVIDER_SITE_OTHER): Payer: Medicare Other | Admitting: Pharmacist Clinician (PhC)/ Clinical Pharmacy Specialist

## 2013-06-12 DIAGNOSIS — Z7901 Long term (current) use of anticoagulants: Secondary | ICD-10-CM

## 2013-06-12 DIAGNOSIS — I82409 Acute embolism and thrombosis of unspecified deep veins of unspecified lower extremity: Secondary | ICD-10-CM

## 2013-06-19 ENCOUNTER — Ambulatory Visit (INDEPENDENT_AMBULATORY_CARE_PROVIDER_SITE_OTHER): Payer: Medicare Other | Admitting: Pharmacist Clinician (PhC)/ Clinical Pharmacy Specialist

## 2013-06-19 DIAGNOSIS — I82409 Acute embolism and thrombosis of unspecified deep veins of unspecified lower extremity: Secondary | ICD-10-CM

## 2013-06-19 DIAGNOSIS — Z7901 Long term (current) use of anticoagulants: Secondary | ICD-10-CM

## 2013-06-19 LAB — POCT INR: INR: 2.3

## 2013-06-26 ENCOUNTER — Ambulatory Visit (INDEPENDENT_AMBULATORY_CARE_PROVIDER_SITE_OTHER): Payer: Medicare Other | Admitting: Pharmacist Clinician (PhC)/ Clinical Pharmacy Specialist

## 2013-06-26 DIAGNOSIS — Z7901 Long term (current) use of anticoagulants: Secondary | ICD-10-CM

## 2013-06-26 DIAGNOSIS — I82409 Acute embolism and thrombosis of unspecified deep veins of unspecified lower extremity: Secondary | ICD-10-CM

## 2013-06-26 LAB — POCT INR: INR: 3.7

## 2013-07-04 ENCOUNTER — Ambulatory Visit (INDEPENDENT_AMBULATORY_CARE_PROVIDER_SITE_OTHER): Payer: Medicare Other | Admitting: Pharmacist Clinician (PhC)/ Clinical Pharmacy Specialist

## 2013-07-04 DIAGNOSIS — Z7901 Long term (current) use of anticoagulants: Secondary | ICD-10-CM

## 2013-07-04 DIAGNOSIS — I82409 Acute embolism and thrombosis of unspecified deep veins of unspecified lower extremity: Secondary | ICD-10-CM

## 2013-07-10 ENCOUNTER — Ambulatory Visit (INDEPENDENT_AMBULATORY_CARE_PROVIDER_SITE_OTHER): Payer: Medicare Other | Admitting: Pharmacist Clinician (PhC)/ Clinical Pharmacy Specialist

## 2013-07-10 DIAGNOSIS — Z7901 Long term (current) use of anticoagulants: Secondary | ICD-10-CM

## 2013-07-10 DIAGNOSIS — I82409 Acute embolism and thrombosis of unspecified deep veins of unspecified lower extremity: Secondary | ICD-10-CM

## 2013-07-10 LAB — POCT INR: INR: 2.2

## 2013-07-17 ENCOUNTER — Ambulatory Visit (INDEPENDENT_AMBULATORY_CARE_PROVIDER_SITE_OTHER): Payer: Medicare Other | Admitting: Pharmacist Clinician (PhC)/ Clinical Pharmacy Specialist

## 2013-07-17 DIAGNOSIS — Z7901 Long term (current) use of anticoagulants: Secondary | ICD-10-CM

## 2013-07-17 DIAGNOSIS — I82409 Acute embolism and thrombosis of unspecified deep veins of unspecified lower extremity: Secondary | ICD-10-CM

## 2013-07-17 LAB — POCT INR: INR: 2.7

## 2013-07-24 ENCOUNTER — Ambulatory Visit (INDEPENDENT_AMBULATORY_CARE_PROVIDER_SITE_OTHER): Payer: Medicare Other | Admitting: Pharmacist Clinician (PhC)/ Clinical Pharmacy Specialist

## 2013-07-24 DIAGNOSIS — Z7901 Long term (current) use of anticoagulants: Secondary | ICD-10-CM

## 2013-07-24 LAB — POCT INR: INR: 2.2

## 2013-07-31 ENCOUNTER — Ambulatory Visit (INDEPENDENT_AMBULATORY_CARE_PROVIDER_SITE_OTHER): Payer: Medicare Other | Admitting: Pharmacist Clinician (PhC)/ Clinical Pharmacy Specialist

## 2013-07-31 DIAGNOSIS — Z7901 Long term (current) use of anticoagulants: Secondary | ICD-10-CM

## 2013-07-31 LAB — POCT INR: INR: 1.9

## 2013-08-07 ENCOUNTER — Ambulatory Visit (INDEPENDENT_AMBULATORY_CARE_PROVIDER_SITE_OTHER): Payer: Medicare Other | Admitting: Pharmacist Clinician (PhC)/ Clinical Pharmacy Specialist

## 2013-08-07 DIAGNOSIS — Z7901 Long term (current) use of anticoagulants: Secondary | ICD-10-CM

## 2013-08-07 LAB — POCT INR: INR: 2.5

## 2013-08-14 ENCOUNTER — Ambulatory Visit (INDEPENDENT_AMBULATORY_CARE_PROVIDER_SITE_OTHER): Payer: Medicare Other | Admitting: Pharmacist Clinician (PhC)/ Clinical Pharmacy Specialist

## 2013-08-14 DIAGNOSIS — Z7901 Long term (current) use of anticoagulants: Secondary | ICD-10-CM

## 2013-08-14 LAB — POCT INR: INR: 2.1

## 2013-08-22 ENCOUNTER — Ambulatory Visit (INDEPENDENT_AMBULATORY_CARE_PROVIDER_SITE_OTHER): Payer: Medicare Other | Admitting: Pharmacist Clinician (PhC)/ Clinical Pharmacy Specialist

## 2013-08-22 DIAGNOSIS — Z7901 Long term (current) use of anticoagulants: Secondary | ICD-10-CM

## 2013-08-22 LAB — POCT INR: INR: 2.1

## 2013-08-27 LAB — POCT INR: INR: 2.1

## 2013-08-28 ENCOUNTER — Ambulatory Visit (INDEPENDENT_AMBULATORY_CARE_PROVIDER_SITE_OTHER): Payer: Medicare Other | Admitting: Pharmacist Clinician (PhC)/ Clinical Pharmacy Specialist

## 2013-08-28 DIAGNOSIS — Z7901 Long term (current) use of anticoagulants: Secondary | ICD-10-CM

## 2013-09-03 LAB — POCT INR: INR: 2.6

## 2013-09-05 ENCOUNTER — Ambulatory Visit (INDEPENDENT_AMBULATORY_CARE_PROVIDER_SITE_OTHER): Payer: Medicare Other | Admitting: Pharmacist Clinician (PhC)/ Clinical Pharmacy Specialist

## 2013-09-05 DIAGNOSIS — Z7901 Long term (current) use of anticoagulants: Secondary | ICD-10-CM

## 2013-09-10 ENCOUNTER — Ambulatory Visit (INDEPENDENT_AMBULATORY_CARE_PROVIDER_SITE_OTHER): Payer: Medicare Other | Admitting: Pharmacist Clinician (PhC)/ Clinical Pharmacy Specialist

## 2013-09-10 DIAGNOSIS — Z7901 Long term (current) use of anticoagulants: Secondary | ICD-10-CM

## 2013-09-10 LAB — PROTIME-INR: INR: 2.4 — AB (ref 0.9–1.1)

## 2013-09-10 LAB — POCT INR: INR: 2.4

## 2013-09-17 ENCOUNTER — Ambulatory Visit (INDEPENDENT_AMBULATORY_CARE_PROVIDER_SITE_OTHER): Payer: Medicare Other | Admitting: Pharmacist Clinician (PhC)/ Clinical Pharmacy Specialist

## 2013-09-17 DIAGNOSIS — Z7901 Long term (current) use of anticoagulants: Secondary | ICD-10-CM

## 2013-09-17 LAB — POCT INR: INR: 2.2

## 2013-09-18 ENCOUNTER — Ambulatory Visit: Payer: Medicare Other | Admitting: Cardiovascular Disease

## 2013-09-25 LAB — POCT INR: INR: 2.2

## 2013-09-26 ENCOUNTER — Ambulatory Visit (INDEPENDENT_AMBULATORY_CARE_PROVIDER_SITE_OTHER): Payer: Medicare Other | Admitting: Pharmacist Clinician (PhC)/ Clinical Pharmacy Specialist

## 2013-09-26 DIAGNOSIS — Z7901 Long term (current) use of anticoagulants: Secondary | ICD-10-CM

## 2013-10-01 ENCOUNTER — Ambulatory Visit (INDEPENDENT_AMBULATORY_CARE_PROVIDER_SITE_OTHER): Payer: Medicare Other | Admitting: Pharmacist Clinician (PhC)/ Clinical Pharmacy Specialist

## 2013-10-01 DIAGNOSIS — Z7901 Long term (current) use of anticoagulants: Secondary | ICD-10-CM

## 2013-10-01 LAB — POCT INR: INR: 2.7

## 2013-10-05 ENCOUNTER — Ambulatory Visit (INDEPENDENT_AMBULATORY_CARE_PROVIDER_SITE_OTHER): Payer: Medicare Other | Admitting: Cardiovascular Disease

## 2013-10-05 ENCOUNTER — Encounter: Payer: Self-pay | Admitting: Cardiovascular Disease

## 2013-10-05 VITALS — BP 124/62 | HR 86 | Ht 72.0 in | Wt 202.7 lb

## 2013-10-05 DIAGNOSIS — I10 Essential (primary) hypertension: Secondary | ICD-10-CM

## 2013-10-05 DIAGNOSIS — E785 Hyperlipidemia, unspecified: Secondary | ICD-10-CM

## 2013-10-05 DIAGNOSIS — I2510 Atherosclerotic heart disease of native coronary artery without angina pectoris: Secondary | ICD-10-CM

## 2013-10-05 NOTE — Assessment & Plan Note (Signed)
Status post coronary artery bypass grafting November 2007 the LIMA to his LAD, vein to diagonal branch and PDA. The ejection fraction at that time was 35-40%. Subsequent determination of his EF has been in the 40-45% range. He had a negative Myoview stress test 07/09/11. He denies chest pain or shortness of breath.

## 2013-10-05 NOTE — Patient Instructions (Signed)
No change in medications   Your physician recommends that you schedule a follow-up appointment in 6 months Corine ShelterLUKE KILROY PA  Your physician wants you to follow-up in 12 MONTHS Dr Allyson SabalBerry. You will receive a reminder letter in the mail two months in advance. If you don't receive a letter, please call our office to schedule the follow-up appointment.

## 2013-10-05 NOTE — Assessment & Plan Note (Signed)
Under good control of her medications 

## 2013-10-05 NOTE — Assessment & Plan Note (Signed)
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 He has been on chronic Coumadin anticoagulation.

## 2013-10-05 NOTE — Assessment & Plan Note (Signed)
On statin therapy followed by his PCP 

## 2013-10-05 NOTE — Progress Notes (Signed)
10/05/2013 Roy Hickman   [birth year]  [id number]  Primary Physician [name], NT Primary Cardiologist: Runell Gess MD Roseanne Reno   HPI:  The patient is a 73 year old moderately overweight widowed Caucasian male with no children, who I last saw 6 months ago. He has a history of CAD, status post coronary artery bypass grafting, November of 2007, with a LIMA to his LAD, and vein to a diagonal branch and PDA. His EF at that time was 35% to 40%. He did have postoperative PAF, maintaining sinus rhythm. His other problems include hyperlipidemia and diabetes as well as remote tobacco abuse. He was scheduled to have C-spine surgery by Dr. [name] because of weakness in his legs. A 2D echocardiogram prior to surgery revealed an EF of 40% to 45%, and Myoview stress test showed inferolateral scar without ischemia. He is otherwise asymptomatic. He apparently did develop a DVT postoperatively and was placed on Coumadin anticoagulation. We obtained followup venous Dopplers that showed extensive left lower extremity DVT and his Coumadin was reinitiated, and since that time his edema has improved. He is essentially nonambulatory and is wheelchair bound.      Current Outpatient Prescriptions  Medication Sig Dispense Refill  . atorvastatin (LIPITOR) 40 MG tablet Take 40 mg by mouth at bedtime.       . carvedilol (COREG) 12.5 MG tablet Take 12.5 mg by mouth 2 (two) times daily with a meal.       . diphenhydrAMINE (BENADRYL) 25 MG tablet Take 25 mg by mouth every 6 (six) hours as needed.      . folic acid ([name]) 1 MG tablet Take 1 mg by mouth daily.       Marland Kitchen glipiZIDE ([name]) 5 MG 24 hr tablet Take 5 mg by mouth daily.       . hydrochlorothiazide (HYDRODIURIL) 25 MG tablet Take 25 mg by mouth daily.      . insulin detemir ([name]) 100 UNIT/ML injection Inject 15 Units into the skin at bedtime.      Marland Kitchen levothyroxine (SYNTHROID, LEVOTHROID) 25 MCG tablet Take 25 mcg by mouth daily  before breakfast.      . metFORMIN ([name]) 1000 MG tablet Take 1,000 mg by mouth daily with breakfast.       . nystatin-triamcinolone (MYCOLOG II) cream as needed.      Marland Kitchen omeprazole (PRILOSEC) 20 MG capsule Take 20 mg by mouth daily.      . QUEtiapine (SEROQUEL) 50 MG tablet Take 50 mg by mouth daily.      . Tamsulosin HCl (FLOMAX) 0.4 MG CAPS Take 0.4 mg by mouth at bedtime.       Marland Kitchen warfarin (COUMADIN) 3 MG tablet Take 3 mg by mouth daily. Per INR      . clopidogrel (PLAVIX) 75 MG tablet Take 75 mg by mouth daily with breakfast.      . hydrOXYzine ([name]) 50 MG tablet Take 50 mg by mouth every 6 (six) hours as needed. For itching.      Marland Kitchen oxyCODONE-acetaminophen (PERCOCET/ROXICET) 5-325 MG per tablet Take 1 tablet by mouth as needed.       No current facility-administered medications for this visit.    No Known Allergies  History   Social History  . Marital Status: Widowed    Spouse Name: N/A    Number of Children: N/A  . Years of Education: N/A   Occupational History  . Not on file.   Social History Main  Topics  . Smoking status: Former Smoker -- 2.00 packs/day for 25 years    Types: Cigarettes    Quit date: 02/27/1987  . Smokeless tobacco: Never Used  . Alcohol Use: No     Comment: quit drinking in 1984  . Drug Use: No  . Sexual Activity: No   Other Topics Concern  . Not on file   Social History Narrative   [facility] [place] [facility]. [facility] in an [facility] by [facility]. Has 5 sisters, no brothers. [phone] or [phone]. Tomma RakersSherley Tucker, Steward DroneBrenda Sizemore.      Review of Systems: General: negative for chills, fever, night sweats or weight changes.  Cardiovascular: negative for chest pain, dyspnea on exertion, edema, orthopnea, palpitations, paroxysmal nocturnal dyspnea or shortness of breath Dermatological: negative for rash Respiratory: negative for cough or wheezing Urologic: negative for  hematuria Abdominal: negative for nausea, vomiting, diarrhea, bright red blood per rectum, melena, or hematemesis Neurologic: negative for visual changes, syncope, or dizziness All other systems reviewed and are otherwise negative except as noted above.    Blood pressure 124/62, pulse 86, height 6' (1.829 m), weight 202 lb 11.2 oz (91.944 kg).  General appearance: alert and no distress Neck: no adenopathy, no carotid bruit, no JVD, supple, symmetrical, trachea midline and thyroid not enlarged, symmetric, no tenderness/mass/nodules Lungs: clear to auscultation bilaterally Heart: regular rate and rhythm, S1, S2 normal, no murmur, click, rub or gallop Extremities: extremities normal, atraumatic, no cyanosis or edema  EKG normal sinus rhythm at 86 with inferior Q waves and low voltage as well as was her reversed R wave progression consistent with an old anterior wall myocardial infarction  ASSESSMENT AND PLAN:   CAD, CABG X 3 11/07. Myoview low risk 1/13 Status post coronary artery bypass grafting November 2007 the LIMA to his LAD, vein to diagonal branch and PDA. The ejection fraction at that time was 35-40%. Subsequent determination of his EF has been in the 40-45% range. He had a negative Myoview stress test 07/09/11. He denies chest pain or shortness of breath.  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 He has been on chronic Coumadin anticoagulation.  HTN (hypertension) Under good control of her medications  Dyslipidemia On statin therapy followed by his PCP      Runell GessJonathan J. Braxtyn Dorff MD [facility] CenterFACP,FACC,FAHA, [facility] 10/05/2013 2:59 PM

## 2013-10-07 IMAGING — CR DG CHEST 2V
2 series · 2 of 2 positions shown · non-contrast
Comparison: Chest radiograph 03/12/2010

CLINICAL DATA: C3-T1 fusion

CHEST - 2 VIEW

[view not recorded (1 of 2)]
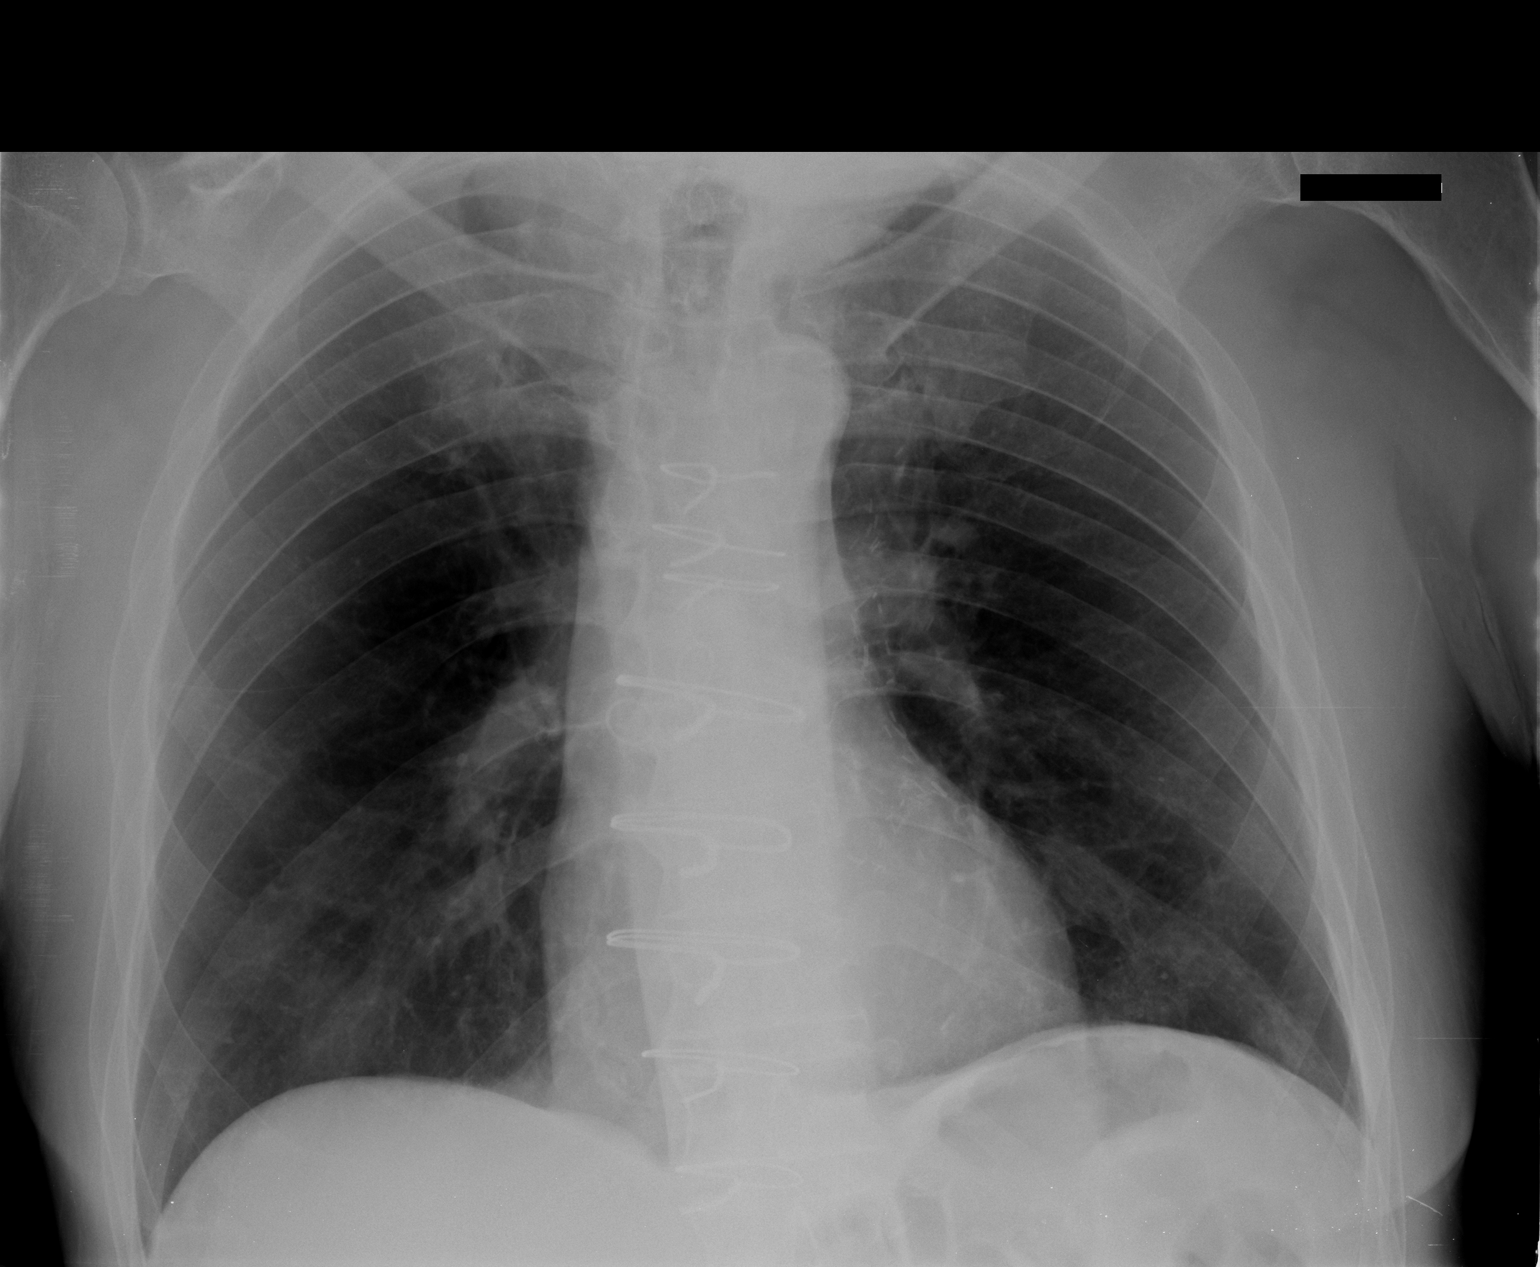

[view not recorded (2 of 2)]
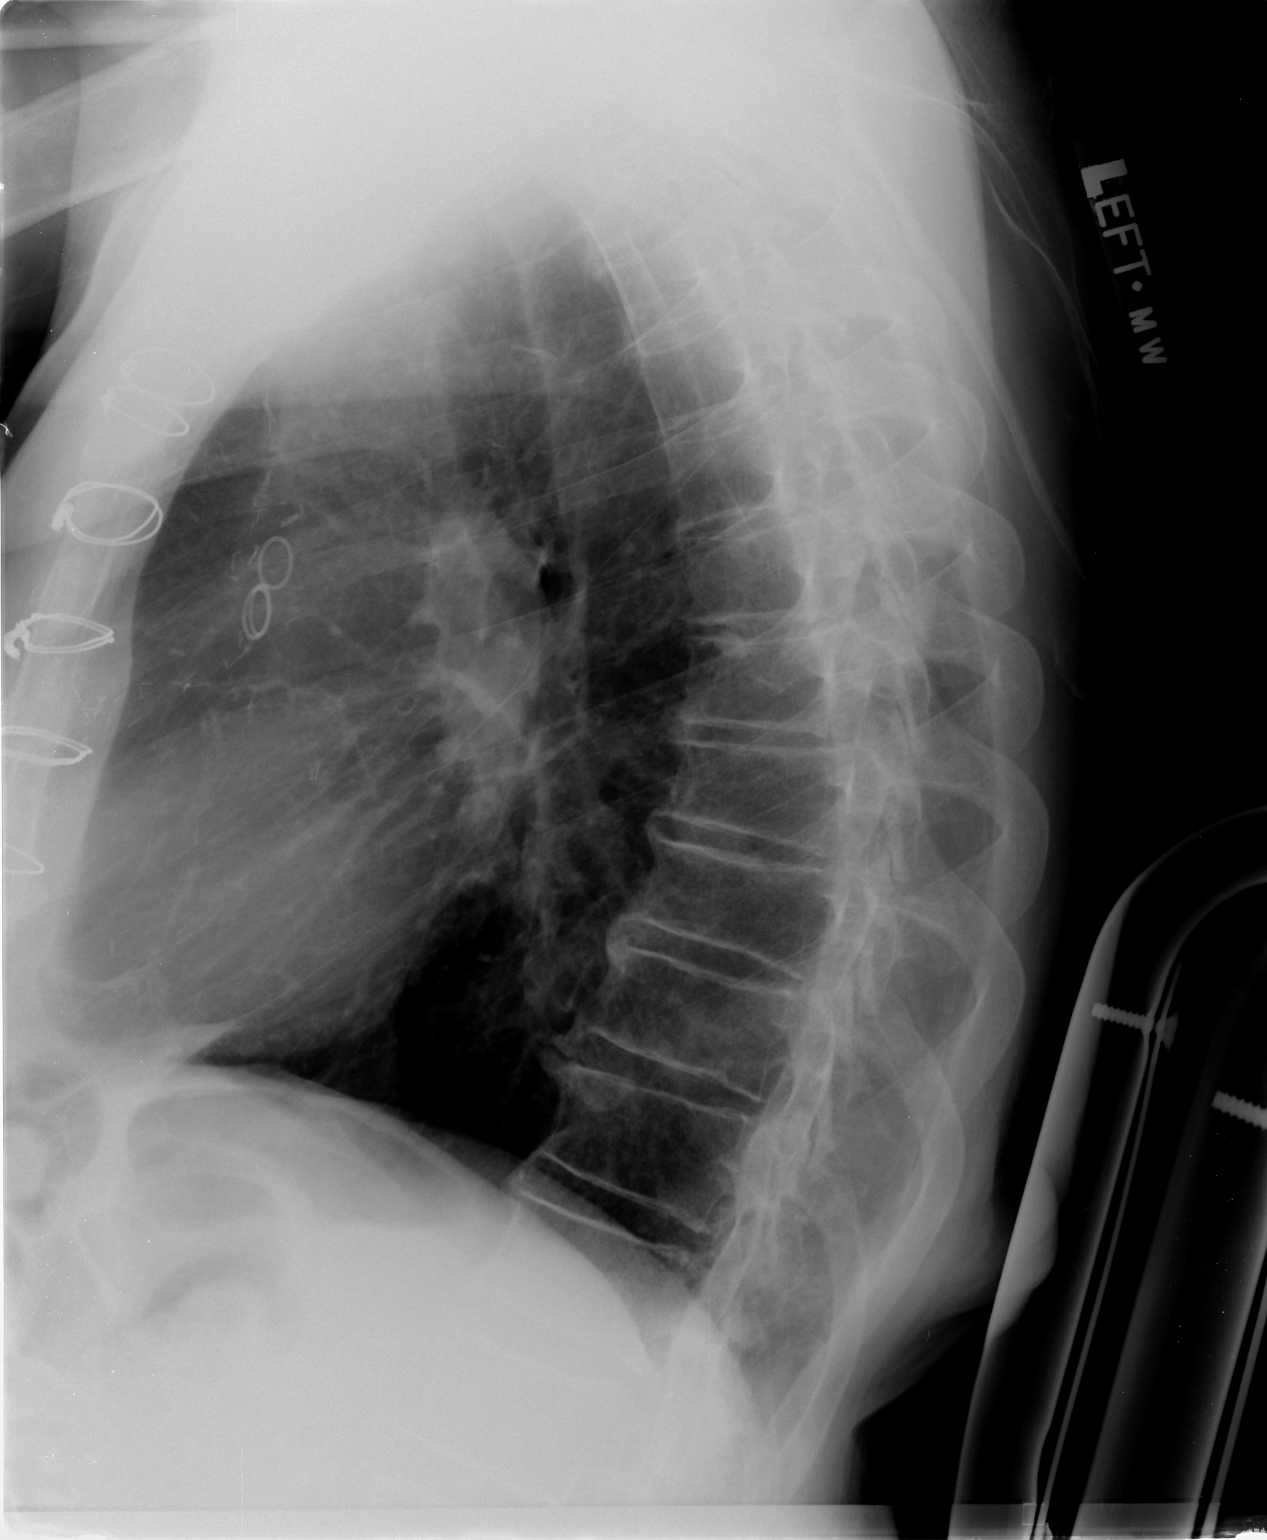

[2 of 2 positions shown; findings below may reference images not displayed]

FINDINGS: There are changes of median sternotomy for CABG.  Heart
size is normal in stable.  Mediastinal hilar contours are within
normal limits and stable.  Pulmonary vascularity is normal.  The
lungs are clear.  There is no pleural effusion or evidence of
pneumothorax.  There are typical degenerative changes of the
thoracic spine, but no suspicious bony abnormality.
IMPRESSION: No acute cardiopulmonary disease.

## 2013-10-08 ENCOUNTER — Ambulatory Visit (INDEPENDENT_AMBULATORY_CARE_PROVIDER_SITE_OTHER): Payer: Medicare Other | Admitting: Pharmacist Clinician (PhC)/ Clinical Pharmacy Specialist

## 2013-10-08 DIAGNOSIS — Z7901 Long term (current) use of anticoagulants: Secondary | ICD-10-CM

## 2013-10-08 LAB — POCT INR: INR: 3.3

## 2013-10-15 ENCOUNTER — Ambulatory Visit (INDEPENDENT_AMBULATORY_CARE_PROVIDER_SITE_OTHER): Payer: Medicare Other | Admitting: Pharmacist Clinician (PhC)/ Clinical Pharmacy Specialist

## 2013-10-15 DIAGNOSIS — Z7901 Long term (current) use of anticoagulants: Secondary | ICD-10-CM

## 2013-10-15 LAB — POCT INR: INR: 2.6

## 2013-11-05 LAB — POCT INR: INR: 2.3

## 2013-11-07 ENCOUNTER — Ambulatory Visit (INDEPENDENT_AMBULATORY_CARE_PROVIDER_SITE_OTHER): Payer: Medicare Other | Admitting: Pharmacist Clinician (PhC)/ Clinical Pharmacy Specialist

## 2013-11-07 DIAGNOSIS — Z7901 Long term (current) use of anticoagulants: Secondary | ICD-10-CM

## 2013-11-12 ENCOUNTER — Ambulatory Visit (INDEPENDENT_AMBULATORY_CARE_PROVIDER_SITE_OTHER): Payer: Medicare Other | Admitting: Pharmacist Clinician (PhC)/ Clinical Pharmacy Specialist

## 2013-11-12 DIAGNOSIS — Z7901 Long term (current) use of anticoagulants: Secondary | ICD-10-CM

## 2013-11-12 LAB — POCT INR: INR: 1.9

## 2013-11-14 ENCOUNTER — Encounter: Payer: Self-pay | Admitting: Pharmacist Clinician (PhC)/ Clinical Pharmacy Specialist

## 2013-11-14 DIAGNOSIS — Z7901 Long term (current) use of anticoagulants: Secondary | ICD-10-CM

## 2013-11-19 LAB — POCT INR: INR: 1.8

## 2013-11-20 ENCOUNTER — Ambulatory Visit (INDEPENDENT_AMBULATORY_CARE_PROVIDER_SITE_OTHER): Payer: Medicare Other | Admitting: Pharmacist Clinician (PhC)/ Clinical Pharmacy Specialist

## 2013-11-20 DIAGNOSIS — Z7901 Long term (current) use of anticoagulants: Secondary | ICD-10-CM

## 2013-11-26 LAB — POCT INR: INR: 1.9

## 2013-11-28 ENCOUNTER — Ambulatory Visit (INDEPENDENT_AMBULATORY_CARE_PROVIDER_SITE_OTHER): Payer: Medicare Other | Admitting: Pharmacist Clinician (PhC)/ Clinical Pharmacy Specialist

## 2013-11-28 DIAGNOSIS — Z7901 Long term (current) use of anticoagulants: Secondary | ICD-10-CM

## 2013-12-03 LAB — POCT INR: INR: 2.2

## 2013-12-04 ENCOUNTER — Ambulatory Visit (INDEPENDENT_AMBULATORY_CARE_PROVIDER_SITE_OTHER): Payer: Medicare Other | Admitting: Pharmacist Clinician (PhC)/ Clinical Pharmacy Specialist

## 2013-12-04 DIAGNOSIS — Z7901 Long term (current) use of anticoagulants: Secondary | ICD-10-CM

## 2013-12-10 ENCOUNTER — Ambulatory Visit (INDEPENDENT_AMBULATORY_CARE_PROVIDER_SITE_OTHER): Payer: Medicare Other | Admitting: Pharmacist Clinician (PhC)/ Clinical Pharmacy Specialist

## 2013-12-10 DIAGNOSIS — Z7901 Long term (current) use of anticoagulants: Secondary | ICD-10-CM

## 2013-12-10 LAB — POCT INR: INR: 2.3

## 2013-12-17 LAB — POCT INR: INR: 2.5

## 2013-12-20 ENCOUNTER — Ambulatory Visit (INDEPENDENT_AMBULATORY_CARE_PROVIDER_SITE_OTHER): Payer: Medicare Other | Admitting: Pharmacist Clinician (PhC)/ Clinical Pharmacy Specialist

## 2013-12-20 DIAGNOSIS — Z7901 Long term (current) use of anticoagulants: Secondary | ICD-10-CM

## 2013-12-24 ENCOUNTER — Ambulatory Visit (INDEPENDENT_AMBULATORY_CARE_PROVIDER_SITE_OTHER): Payer: Medicare Other | Admitting: Pharmacist Clinician (PhC)/ Clinical Pharmacy Specialist

## 2013-12-24 DIAGNOSIS — Z7901 Long term (current) use of anticoagulants: Secondary | ICD-10-CM

## 2013-12-24 DIAGNOSIS — I82409 Acute embolism and thrombosis of unspecified deep veins of unspecified lower extremity: Secondary | ICD-10-CM

## 2013-12-24 LAB — POCT INR: INR: 2.2

## 2013-12-31 ENCOUNTER — Ambulatory Visit (INDEPENDENT_AMBULATORY_CARE_PROVIDER_SITE_OTHER): Payer: Medicare Other | Admitting: Pharmacist Clinician (PhC)/ Clinical Pharmacy Specialist

## 2013-12-31 DIAGNOSIS — I82409 Acute embolism and thrombosis of unspecified deep veins of unspecified lower extremity: Secondary | ICD-10-CM

## 2013-12-31 DIAGNOSIS — Z7901 Long term (current) use of anticoagulants: Secondary | ICD-10-CM

## 2013-12-31 LAB — POCT INR: INR: 2.4

## 2014-01-07 ENCOUNTER — Ambulatory Visit (INDEPENDENT_AMBULATORY_CARE_PROVIDER_SITE_OTHER): Payer: Medicare Other | Admitting: Pharmacist

## 2014-01-07 DIAGNOSIS — Z7901 Long term (current) use of anticoagulants: Secondary | ICD-10-CM

## 2014-01-07 DIAGNOSIS — I82409 Acute embolism and thrombosis of unspecified deep veins of unspecified lower extremity: Secondary | ICD-10-CM

## 2014-01-07 LAB — POCT INR: INR: 2.9

## 2014-01-14 ENCOUNTER — Ambulatory Visit (INDEPENDENT_AMBULATORY_CARE_PROVIDER_SITE_OTHER): Payer: Medicare Other | Admitting: Pharmacist

## 2014-01-14 DIAGNOSIS — Z7901 Long term (current) use of anticoagulants: Secondary | ICD-10-CM

## 2014-01-14 DIAGNOSIS — I82409 Acute embolism and thrombosis of unspecified deep veins of unspecified lower extremity: Secondary | ICD-10-CM

## 2014-01-14 LAB — POCT INR: INR: 3.3

## 2014-01-21 LAB — POCT INR: INR: 2.3

## 2014-01-24 ENCOUNTER — Ambulatory Visit (INDEPENDENT_AMBULATORY_CARE_PROVIDER_SITE_OTHER): Payer: Medicare Other | Admitting: Pharmacist Clinician (PhC)/ Clinical Pharmacy Specialist

## 2014-01-24 DIAGNOSIS — Z7901 Long term (current) use of anticoagulants: Secondary | ICD-10-CM

## 2014-01-24 DIAGNOSIS — I82409 Acute embolism and thrombosis of unspecified deep veins of unspecified lower extremity: Secondary | ICD-10-CM

## 2014-01-28 LAB — POCT INR: INR: 2.1

## 2014-01-29 ENCOUNTER — Ambulatory Visit (INDEPENDENT_AMBULATORY_CARE_PROVIDER_SITE_OTHER): Payer: Medicare Other | Admitting: Pharmacist Clinician (PhC)/ Clinical Pharmacy Specialist

## 2014-01-29 DIAGNOSIS — Z7901 Long term (current) use of anticoagulants: Secondary | ICD-10-CM

## 2014-01-29 DIAGNOSIS — I82409 Acute embolism and thrombosis of unspecified deep veins of unspecified lower extremity: Secondary | ICD-10-CM

## 2014-02-01 ENCOUNTER — Telehealth: Payer: Self-pay | Admitting: Pharmacist Clinician (PhC)/ Clinical Pharmacy Specialist

## 2014-02-01 NOTE — Telephone Encounter (Signed)
Pt sister called, company they are getting INR supplies thru will no longer bill their insurance, would like to know name of another home testing company

## 2014-02-08 NOTE — Telephone Encounter (Signed)
Received request from [facility] for prescription to continue home INR monitoring.  Signed by Dr. Allyson SabalBerry and faxed

## 2014-03-14 ENCOUNTER — Ambulatory Visit (INDEPENDENT_AMBULATORY_CARE_PROVIDER_SITE_OTHER): Payer: Medicare Other | Admitting: Pharmacist Clinician (PhC)/ Clinical Pharmacy Specialist

## 2014-03-14 DIAGNOSIS — Z7901 Long term (current) use of anticoagulants: Secondary | ICD-10-CM

## 2014-03-14 DIAGNOSIS — I82409 Acute embolism and thrombosis of unspecified deep veins of unspecified lower extremity: Secondary | ICD-10-CM

## 2014-03-14 LAB — POCT INR: INR: 2.5

## 2014-03-28 LAB — POCT INR: INR: 2

## 2014-03-30 ENCOUNTER — Ambulatory Visit (INDEPENDENT_AMBULATORY_CARE_PROVIDER_SITE_OTHER): Payer: Medicare Other | Admitting: Pharmacist Clinician (PhC)/ Clinical Pharmacy Specialist

## 2014-03-30 DIAGNOSIS — I82409 Acute embolism and thrombosis of unspecified deep veins of unspecified lower extremity: Secondary | ICD-10-CM

## 2014-03-30 DIAGNOSIS — Z7901 Long term (current) use of anticoagulants: Secondary | ICD-10-CM

## 2014-04-10 ENCOUNTER — Encounter (HOSPITAL_COMMUNITY): Payer: Self-pay | Admitting: Emergency Medicine

## 2014-04-10 ENCOUNTER — Emergency Department (HOSPITAL_COMMUNITY)
Admission: EM | Admit: 2014-04-10 | Discharge: 2014-04-10 | Disposition: A | Discharge status: Home / Self Care | Payer: Medicare Other | Attending: Emergency Medicine | Admitting: Emergency Medicine

## 2014-04-10 DIAGNOSIS — Z792 Long term (current) use of antibiotics: Secondary | ICD-10-CM | POA: Diagnosis not present

## 2014-04-10 DIAGNOSIS — R413 Other amnesia: Secondary | ICD-10-CM | POA: Diagnosis not present

## 2014-04-10 DIAGNOSIS — N390 Urinary tract infection, site not specified: Secondary | ICD-10-CM

## 2014-04-10 DIAGNOSIS — Z87891 Personal history of nicotine dependence: Secondary | ICD-10-CM | POA: Diagnosis not present

## 2014-04-10 DIAGNOSIS — Z8719 Personal history of other diseases of the digestive system: Secondary | ICD-10-CM | POA: Insufficient documentation

## 2014-04-10 DIAGNOSIS — I10 Essential (primary) hypertension: Secondary | ICD-10-CM | POA: Insufficient documentation

## 2014-04-10 DIAGNOSIS — R531 Weakness: Secondary | ICD-10-CM | POA: Diagnosis present

## 2014-04-10 DIAGNOSIS — Z7902 Long term (current) use of antithrombotics/antiplatelets: Secondary | ICD-10-CM | POA: Insufficient documentation

## 2014-04-10 DIAGNOSIS — Z79899 Other long term (current) drug therapy: Secondary | ICD-10-CM | POA: Insufficient documentation

## 2014-04-10 DIAGNOSIS — N179 Acute kidney failure, unspecified: Secondary | ICD-10-CM | POA: Diagnosis not present

## 2014-04-10 DIAGNOSIS — I252 Old myocardial infarction: Secondary | ICD-10-CM | POA: Insufficient documentation

## 2014-04-10 DIAGNOSIS — E119 Type 2 diabetes mellitus without complications: Secondary | ICD-10-CM | POA: Diagnosis not present

## 2014-04-10 DIAGNOSIS — E785 Hyperlipidemia, unspecified: Secondary | ICD-10-CM | POA: Diagnosis not present

## 2014-04-10 DIAGNOSIS — Z951 Presence of aortocoronary bypass graft: Secondary | ICD-10-CM | POA: Insufficient documentation

## 2014-04-10 DIAGNOSIS — I2510 Atherosclerotic heart disease of native coronary artery without angina pectoris: Secondary | ICD-10-CM | POA: Insufficient documentation

## 2014-04-10 DIAGNOSIS — Z7901 Long term (current) use of anticoagulants: Secondary | ICD-10-CM | POA: Insufficient documentation

## 2014-04-10 LAB — URINALYSIS, ROUTINE W REFLEX MICROSCOPIC
Bilirubin Urine: NEGATIVE
Glucose, UA: NEGATIVE mg/dL
Hgb urine dipstick: NEGATIVE
Ketones, ur: NEGATIVE mg/dL
LEUKOCYTES UA: NEGATIVE
NITRITE: POSITIVE — AB
Protein, ur: NEGATIVE mg/dL
SPECIFIC GRAVITY, URINE: 1.023 (ref 1.005–1.030)
Urobilinogen, UA: 1 mg/dL (ref 0.0–1.0)
pH: 6.5 (ref 5.0–8.0)

## 2014-04-10 LAB — BASIC METABOLIC PANEL
ANION GAP: 16 — AB (ref 5–15)
BUN: 29 mg/dL — ABNORMAL HIGH (ref 6–23)
CHLORIDE: 99 meq/L (ref 96–112)
CO2: 26 meq/L (ref 19–32)
Calcium: 8.6 mg/dL (ref 8.4–10.5)
Creatinine, Ser: 1.42 mg/dL — ABNORMAL HIGH (ref 0.50–1.35)
GFR calc Af Amer: 54 mL/min — ABNORMAL LOW (ref >90)
GFR calc non Af Amer: 47 mL/min — ABNORMAL LOW (ref >90)
Glucose, Bld: 134 mg/dL — ABNORMAL HIGH (ref 70–99)
Potassium: 3.2 mEq/L — ABNORMAL LOW (ref 3.7–5.3)
Sodium: 141 mEq/L (ref 137–147)

## 2014-04-10 LAB — URINE MICROSCOPIC-ADD ON

## 2014-04-10 LAB — CBC WITH DIFFERENTIAL/PLATELET
BASOS ABS: 0 10*3/uL (ref 0.0–0.1)
Basophils Relative: 0 % (ref 0–1)
Eosinophils Absolute: 0.1 10*3/uL (ref 0.0–0.7)
Eosinophils Relative: 1 % (ref 0–5)
HEMATOCRIT: 29.8 % — AB (ref 39.0–52.0)
Hemoglobin: 9.5 g/dL — ABNORMAL LOW (ref 13.0–17.0)
Lymphocytes Relative: 10 % — ABNORMAL LOW (ref 12–46)
Lymphs Abs: 1 10*3/uL (ref 0.7–4.0)
MCH: 26.8 pg (ref 26.0–34.0)
MCHC: 31.9 g/dL (ref 30.0–36.0)
MCV: 84.2 fL (ref 78.0–100.0)
Monocytes Absolute: 0.6 10*3/uL (ref 0.1–1.0)
Monocytes Relative: 6 % (ref 3–12)
NEUTROS ABS: 8.5 10*3/uL — AB (ref 1.7–7.7)
Neutrophils Relative %: 83 % — ABNORMAL HIGH (ref 43–77)
PLATELETS: 312 10*3/uL (ref 150–400)
RBC: 3.54 MIL/uL — ABNORMAL LOW (ref 4.22–5.81)
RDW: 15.3 % (ref 11.5–15.5)
WBC: 10.1 10*3/uL (ref 4.0–10.5)

## 2014-04-10 LAB — PROTIME-INR
INR: 2.58 — ABNORMAL HIGH (ref 0.00–1.49)
Prothrombin Time: 27.9 seconds — ABNORMAL HIGH (ref 11.6–15.2)

## 2014-04-10 MED ORDER — SODIUM CHLORIDE 0.9 % IV BOLUS (SEPSIS)
500.0000 mL | Freq: Once | INTRAVENOUS | Status: AC
  Administered 2014-04-10: 500 mL via INTRAVENOUS

## 2014-04-10 MED ORDER — CEPHALEXIN 500 MG PO CAPS: 500.0000 mg | ORAL_CAPSULE | Freq: Two times a day (BID) | ORAL | Status: DC

## 2014-04-10 MED ORDER — DEXTROSE 5 % IV SOLN
1.0000 g | Freq: Once | INTRAVENOUS | Status: AC
  Administered 2014-04-10: 1 g via INTRAVENOUS
  Filled 2014-04-10: qty 10

## 2014-04-10 NOTE — ED Notes (Signed)
Dr Wilkie AyeHorton speaking to patient family at bedside.

## 2014-04-10 NOTE — Discharge Instructions (Signed)
Acute Kidney Injury °Acute kidney injury is a disease in which there is sudden (acute) damage to the kidneys. The kidneys are 2 organs that lie on either side of the spine between the middle of the back and the front of the abdomen. The kidneys: °· Remove wastes and extra water from the blood.   °· Produce important hormones. These help keep bones strong, regulate blood pressure, and help create red blood cells.   °· Balance the fluids and chemicals in the blood and tissues. °A small amount of kidney damage may not cause problems, but a large amount of damage may make it difficult or impossible for the kidneys to work the way they should. Acute kidney injury may develop into long-lasting (chronic) kidney disease. It may also develop into a life-threatening disease called end-stage kidney disease. Acute kidney injury can get worse very quickly, so it should be treated right away. Early treatment may prevent other kidney diseases from developing. ° °CAUSES  °· A problem with blood flow to the kidneys. This may be caused by:   °· Blood loss.   °· Heart disease.   °· Severe burns.   °· Liver disease. °· Direct damage to the kidneys. This may be caused by: °· Some medicines.   °· A kidney infection.   °· Poisoning or consuming toxic substances.   °· A surgical wound.   °· A blow to the kidney area.   °· A problem with urine flow. This may be caused by:   °· Cancer.   °· Kidney stones.   °· An enlarged prostate. °SYMPTOMS  °· Swelling (edema) of the legs, ankles, or feet.   °· Tiredness (lethargy).   °· Nausea or vomiting.   °· Confusion.   °· Problems with urination, such as:   °· Painful or burning feeling during urination.   °· Decreased urine production.   °· Frequent accidents in children who are potty trained.   °· Bloody urine.   °· Muscle twitches and cramps.   °· Shortness of breath.   °· Seizures.   °· Chest pain or pressure. °Sometimes, no symptoms are present.  °DIAGNOSIS °Acute kidney injury may be detected  and diagnosed by tests, including blood, urine, imaging, or kidney biopsy tests.  °TREATMENT °Treatment of acute kidney injury varies depending on the cause and severity of the kidney damage. In mild cases, no treatment may be needed. The kidneys may heal on their own. If acute kidney injury is more severe, your caregiver will treat the cause of the kidney damage, help the kidneys heal, and prevent complications from occurring. Severe cases may require a procedure to remove toxic wastes from the body (dialysis) or surgery to repair kidney damage. Surgery may involve:  °· Repair of a torn kidney.   °· Removal of an obstruction. °Most of the time, you will need to stay overnight at the hospital.  °HOME CARE INSTRUCTIONS: °· Follow your prescribed diet. °· Only take over-the-counter or prescription medicines as directed by your caregiver.  °· Do not take any new medicines (prescription, over-the-counter, or nutritional supplements) unless approved by your caregiver. Many medicines can worsen your kidney damage or need to have the dose adjusted.   °· Keep all follow-up appointments as directed by your caregiver. °· Observe your condition to make sure you are healing as expected. °SEEK IMMEDIATE MEDICAL CARE IF: °· You are feeling ill or have severe pain in the back or side.   °· Your symptoms return or you have new symptoms. °· You have any symptoms of end-stage kidney disease. These include:   °¨ Persistent itchiness.   °¨ Loss of appetite.   °¨ Headaches.   °¨ Abnormally dark   or light skin. °¨ Numbness in the hands or feet.   °¨ Easy bruising.   °¨ Frequent hiccups.   °¨ Menstruation stops.   °· You have a fever. °· You have increased urine production. °· You have pain or bleeding when urinating. °MAKE SURE YOU:  °· Understand these instructions. °· Will watch your condition. °· Will get help right away if you are not doing well or get worse °Document Released: 01/04/2011 Document Revised: 10/16/2012 Document  Reviewed: 02/18/2012 °ExitCare® Patient Information ©2015 [employer]. This information is not intended to replace advice given to you by your health care provider. Make sure you discuss any questions you have with your health care provider. °Urinary Tract Infection °Urinary tract infections (UTIs) can develop anywhere along your urinary tract. Your urinary tract is your body's drainage system for removing wastes and extra water. Your urinary tract includes two kidneys, two ureters, a bladder, and a urethra. Your kidneys are a pair of bean-shaped organs. Each kidney is about the size of your fist. They are located below your ribs, one on each side of your spine. °CAUSES °Infections are caused by microbes, which are microscopic organisms, including fungi, viruses, and bacteria. These organisms are so small that they can only be seen through a microscope. Bacteria are the microbes that most commonly cause UTIs. °SYMPTOMS  °Symptoms of UTIs may vary by age and gender of the patient and by the location of the infection. Symptoms in young women typically include a frequent and intense urge to urinate and a painful, burning feeling in the bladder or urethra during urination. Older women and men are more likely to be tired, shaky, and weak and have muscle aches and abdominal pain. A fever may mean the infection is in your kidneys. Other symptoms of a kidney infection include pain in your back or sides below the ribs, nausea, and vomiting. °DIAGNOSIS °To diagnose a UTI, your caregiver will ask you about your symptoms. Your caregiver also will ask to provide a urine sample. The urine sample will be tested for bacteria and white blood cells. White blood cells are made by your body to help fight infection. °TREATMENT  °Typically, UTIs can be treated with medication. Because most UTIs are caused by a bacterial infection, they usually can be treated with the use of antibiotics. The choice of antibiotic and length of treatment  depend on your symptoms and the type of bacteria causing your infection. °HOME CARE INSTRUCTIONS °· If you were prescribed antibiotics, take them exactly as your caregiver instructs you. Finish the medication even if you feel better after you have only taken some of the medication. °· Drink enough water and fluids to keep your urine clear or pale yellow. °· Avoid caffeine, tea, and carbonated beverages. They tend to irritate your bladder. °· Empty your bladder often. Avoid holding urine for long periods of time. °· Empty your bladder before and after sexual intercourse. °· After a bowel movement, women should cleanse from front to back. Use each tissue only once. °SEEK MEDICAL CARE IF:  °· You have back pain. °· You develop a fever. °· Your symptoms do not begin to resolve within 3 days. °SEEK IMMEDIATE MEDICAL CARE IF:  °· You have severe back pain or lower abdominal pain. °· You develop chills. °· You have nausea or vomiting. °· You have continued burning or discomfort with urination. °MAKE SURE YOU:  °· Understand these instructions. °· Will watch your condition. °· Will get help right away if you are not doing   well or get worse. °Document Released: 03/31/2005 Document Revised: 12/21/2011 Document Reviewed: 07/30/2011 °ExitCare® Patient Information ©2015 [employer]. This information is not intended to replace advice given to you by your health care provider. Make sure you discuss any questions you have with your health care provider. ° °

## 2014-04-10 NOTE — ED Notes (Signed)
Per EMS, patient has new onset weakness, unable to stand or walk unassisted, also has new onset memory impairment. Patient is from home. Patient was seen at PCP late September, was started on new medication (Cymbalta) at that time and has had a change in status since that time. Patient VSS stable, glucose was 74 this am, patient ate a cookie and sandwich. Patient arrives with home medications. Patient was able to move himself to bed from stretcher.

## 2014-04-10 NOTE — ED Notes (Signed)
Patient okay for POs. Patient given diet sprite. Patient and family updated on plan of care.

## 2014-04-10 NOTE — ED Provider Notes (Signed)
CSN: [id number]     Arrival date & time 04/10/14  1200 History   First MD Initiated Contact with Patient 04/10/14 1239     Chief Complaint  Patient presents with  . Weakness    generalized, unable to stand/walk unassisted (new)  . memory impairment     new     (Consider location/radiation/quality/duration/timing/severity/associated sxs/prior Treatment) HPI  This a 73 year old male with history of hypertension, diabetes, coronary artery disease who presents by ambulance with generalized weakness. When asked, the patient states "I'll Here." He reports that his sisters requested he be transported. He has no physical complaints at this time. He denies headache, shortness of breath, chest pain, cough, fever, abdominal pain. He is alert and oriented.  Patient's niece and great niece around the bedside. They report he has had increasing difficulties with transfers at home. He normally walks with a walker or gets around in a wheelchair. He has home health and just started physical therapy yesterday. They have not noted any localized weakness, speech difficulties.  Past Medical History  Diagnosis Date  . Hypertension   . Diabetes mellitus   . MI (mitral incompetence)   . Myocardial infarction 2007    s/p CABG   . Coronary artery disease   . GERD (gastroesophageal reflux disease)   . Hyperlipidemia   . DVT (deep vein thrombosis) in pregnancy    Past Surgical History  Procedure Laterality Date  . Elbow surgery      left  . Laminectomy    . Cervical spine surgery    . Coronary artery bypass graft  2008  . Back surgery    . Posterior cervical fusion/foraminotomy  07/21/2011    Procedure: POSTERIOR CERVICAL FUSION/FORAMINOTOMY LEVEL 5;  Surgeon: Karn Cassis, MD;  Location: [facility];  Service: Neurosurgery;  Laterality: N/A;  Cervical three to Thoracic one Laminectomy, posterolateral arthrodesis, with Lateral Mass screws   Family History  Problem Relation Age of Onset  . Heart  failure Father   . Heart failure Sister    History  Substance Use Topics  . Smoking status: Former Smoker -- 2.00 packs/day for 25 years    Types: Cigarettes    Quit date: 02/27/1987  . Smokeless tobacco: Never Used  . Alcohol Use: No     Comment: quit drinking in 1984    Review of Systems  Constitutional: Negative for fever.  Respiratory: Negative.  Negative for chest tightness and shortness of breath.   Cardiovascular: Negative.  Negative for chest pain.  Gastrointestinal: Negative.  Negative for abdominal pain.  Genitourinary: Negative.  Negative for dysuria.  Musculoskeletal: Negative for back pain.  Neurological: Positive for weakness. Negative for numbness and headaches.  Psychiatric/Behavioral: Negative for confusion.  All other systems reviewed and are negative.     Allergies  Review of patient's allergies indicates no known allergies.  Home Medications   Prior to Admission medications   Medication Sig Start Date End Date Taking? Authorizing Provider  atorvastatin (LIPITOR) 40 MG tablet Take 40 mg by mouth at bedtime.    Yes Historical Provider, MD  carvedilol (COREG) 12.5 MG tablet Take 12.5 mg by mouth 2 (two) times daily with a meal.    Yes Historical Provider, MD  clopidogrel (PLAVIX) 75 MG tablet Take 75 mg by mouth daily with breakfast.   Yes Historical Provider, MD  diphenhydrAMINE (BENADRYL) 25 MG tablet Take 25 mg by mouth every 6 (six) hours as needed for itching, allergies or sleep.    Yes  Historical Provider, MD  DULoxetine (CYMBALTA) 30 MG capsule Take 30 mg by mouth 2 (two) times daily.   Yes Historical Provider, MD  folic acid ([name]) 1 MG tablet Take 1 mg by mouth daily.    Yes Historical Provider, MD  glipiZIDE ([name]) 5 MG 24 hr tablet Take 5 mg by mouth daily.    Yes Historical Provider, MD  hydrochlorothiazide (HYDRODIURIL) 25 MG tablet Take 25 mg by mouth daily. 02/12/13  Yes Historical Provider, MD  insulin detemir ([name]) 100 UNIT/ML  injection Inject 15 Units into the skin at bedtime.   Yes Historical Provider, MD  levothyroxine (SYNTHROID, LEVOTHROID) 25 MCG tablet Take 25 mcg by mouth daily before breakfast.   Yes Historical Provider, MD  metFORMIN ([name]) 1000 MG tablet Take 1,000 mg by mouth daily with breakfast.  05/14/11  Yes Shanker Levora DredgeM Ghimire, MD  mupirocin ointment (BACTROBAN) 2 % Apply 1 application topically 3 (three) times daily.   Yes Historical Provider, MD  nystatin-triamcinolone (MYCOLOG II) cream Apply 1 application topically as needed (skin irritation).  12/18/12  Yes Historical Provider, MD  oxyCODONE-acetaminophen (PERCOCET/ROXICET) 5-325 MG per tablet Take 1 tablet by mouth every 4 (four) hours as needed for moderate pain.  08/31/13  Yes Historical Provider, MD  QUEtiapine (SEROQUEL) 50 MG tablet Take 50 mg by mouth daily. 01/24/13  Yes Historical Provider, MD  Tamsulosin HCl (FLOMAX) 0.4 MG CAPS Take 0.4 mg by mouth at bedtime.    Yes Historical Provider, MD  warfarin (COUMADIN) 5 MG tablet Take 5 mg by mouth See admin instructions. 5mg  qd except, on Wednesday and Sunday, takes 7.5mg    Yes Historical Provider, MD  cephALEXin (KEFLEX) 500 MG capsule Take 1 capsule (500 mg total) by mouth 2 (two) times daily. 04/10/14   Shon Batonourtney F Anyelin Mogle, MD   BP 136/66  Pulse 71  Temp(Src) 98.5 F (36.9 C) (Oral)  Resp 13  SpO2 98% Physical Exam  Nursing note and vitals reviewed. Constitutional: He is oriented to person, place, and time. No distress.  Elderly  HENT:  Head: Normocephalic and atraumatic.  Eyes: Pupils are equal, round, and reactive to light.  Cardiovascular: Normal rate, regular rhythm and normal heart sounds.   No murmur heard. Pulmonary/Chest: Effort normal and breath sounds normal. No respiratory distress. He has no wheezes.  Abdominal: Soft. Bowel sounds are normal. There is no tenderness. There is no rebound.  Musculoskeletal: He exhibits no edema.  Neurological: He is alert and oriented to  person, place, and time.  5 out of 5 strength in all 4 extremities, no dysmetria to finger-nose-finger, fluent speech, oriented x3  Skin: Skin is warm and dry.  Psychiatric: He has a normal mood and affect.    ED Course  Procedures (including critical care time) Labs Review Labs Reviewed  CBC WITH DIFFERENTIAL - Abnormal; Notable for the following:    RBC 3.54 (*)    Hemoglobin 9.5 (*)    HCT 29.8 (*)    Neutrophils Relative % 83 (*)    Neutro Abs 8.5 (*)    Lymphocytes Relative 10 (*)    All other components within normal limits  BASIC METABOLIC PANEL - Abnormal; Notable for the following:    Potassium 3.2 (*)    Glucose, Bld 134 (*)    BUN 29 (*)    Creatinine, Ser 1.42 (*)    GFR calc non Af Amer 47 (*)    GFR calc Af Amer 54 (*)    Anion gap 16 (*)  All other components within normal limits  URINALYSIS, ROUTINE W REFLEX MICROSCOPIC - Abnormal; Notable for the following:    Nitrite POSITIVE (*)    All other components within normal limits  PROTIME-INR - Abnormal; Notable for the following:    Prothrombin Time 27.9 (*)    INR 2.58 (*)    All other components within normal limits  URINE MICROSCOPIC-ADD ON - Abnormal; Notable for the following:    Bacteria, UA FEW (*)    All other components within normal limits  URINE CULTURE    Imaging Review No results found.   EKG Interpretation None      MDM   Final diagnoses:  UTI (lower urinary tract infection)  AKI (acute kidney injury)    Patient presents with generalized weakness from home. He has no complaints with states he is having more difficulty getting around. He is nonfocal on exam. He has home health and physical therapy. Basic labwork obtained. Lab work notable for mild hypokalemia, creatinine of 1.42 which is up from the patient's baseline, and nitrite-positive urine. Patient was given 500 cc of fluid. He was given IV Rocephin. Discussed with the family and the patient admission for hydration given acute  kidney injury. Suspect patient's symptoms are likely related to acute UTI and acute kidney injury. Patient would like to be discharged home. He is nonfocal. He was able to ambulate at his baseline with a walker. Acute kidney injury is mild. Feel it is reasonable to trial patient with home antibiotic.  Patient instructed to followup with primary doctor in one week for repeat kidney function tests. The patient and family stated understanding.  After history, exam, and medical workup I feel the patient has been appropriately medically screened and is safe for discharge home. Pertinent diagnoses were discussed with the patient. Patient was given return precautions.     Shon Baton, MD 04/11/14 [phone]

## 2014-04-10 NOTE — ED Notes (Signed)
Patient is unable to state why he is here, states "they wanted me to take a ride, so I did." Patient states his two sisters are coming. Patient is alert to self, can name the president, but is unsure the year (states it is 1990-something, then corrects self to say 1988), patient is able to state [facility] is at [facility]. Patient denies complaints. When asked about his ambulation patient states he is supposed to use a walker but doesn't, and that his trouble with walking has been going on "for awhile".

## 2014-04-10 NOTE — ED Notes (Signed)
Bed: WA01 Expected date:  Expected time:  Means of arrival:  Comments: EMS 73 yo weakness

## 2014-04-12 ENCOUNTER — Ambulatory Visit (INDEPENDENT_AMBULATORY_CARE_PROVIDER_SITE_OTHER): Payer: Medicare Other | Admitting: Pharmacist Clinician (PhC)/ Clinical Pharmacy Specialist

## 2014-04-12 DIAGNOSIS — I82409 Acute embolism and thrombosis of unspecified deep veins of unspecified lower extremity: Secondary | ICD-10-CM

## 2014-04-12 LAB — URINE CULTURE: Colony Count: >=100000

## 2014-04-12 LAB — POCT INR: INR: 3.5

## 2014-04-13 NOTE — Progress Notes (Signed)
ED Antimicrobial Stewardship Positive Culture Follow Up   Roy Hickman is an 73 y.o. male who [facility] with a chief complaint of  Chief Complaint  Patient presents with  . Weakness    generalized, unable to stand/walk unassisted (new)  . memory impairment     new    Recent Results (from the past 720 hour(s))  URINE CULTURE     Status: None   Collection Time    04/10/14  3:10 PM      Result Value Ref Range Status   Specimen Description URINE, CATHETERIZED   Final   Special Requests NONE   Final   Culture  Setup Time     Final   Value: 04/10/2014 20:46     Performed at [facility]   Colony Count     Final   Value: >=100,000 COLONIES/ML     Performed at [facility]   Culture     Final   Value: PSEUDOMONAS AERUGINOSA     Performed at [facility]   Report Status 04/12/2014 FINAL   Final   Organism ID, Bacteria PSEUDOMONAS AERUGINOSA   Final    [x]  Treated with Keflex, organism resistant to prescribed antimicrobial  New antibiotic prescription: Cipro 500mg  PO BID x 7 days.   Patient should stop Keflex.  ED Provider: Marlon Peliffany Greene, PA-C   Sallee Provencalurner, Debera Sterba S 04/13/2014, 1:45 PM Infectious Diseases Pharmacist Phone# [phone]

## 2014-04-14 ENCOUNTER — Telehealth (HOSPITAL_BASED_OUTPATIENT_CLINIC_OR_DEPARTMENT_OTHER): Payer: Self-pay | Admitting: Emergency Medicine

## 2014-04-14 NOTE — Telephone Encounter (Signed)
Post ED Visit - Positive Culture Follow-up: Successful Patient Follow-Up  Culture assessed and recommendations reviewed by: []  Wes Dulaney, Pharm.D., BCPS []  Celedonio MiyamotoJeremy Frens, Pharm.D., BCPS []  Georgina PillionElizabeth Martin, Pharm.D., BCPS []  YaleMinh Pham, [place]., BCPS, AAHIVP [x]  Estella HuskMichelle Turner, Pharm.D., BCPS, AAHIVP []  Red ChristiansSamson Lee, Pharm.D. []  Cassie Roseanne RenoStewart, [place]arm.D.  Positive urine culture  []  Patient discharged without antimicrobial prescription and treatment is now indicated [x]  Organism is resistant to prescribed ED discharge antimicrobial []  Patient with positive blood cultures  Changes discussed with ED provider: Marlon Peliffany Greene PA New antibiotic prescription Cipro 500 mg PO BID x seven days  04/14/14- no answer   Jiles HaroldGammons, Fabian Walder Chaney 04/14/2014, 11:57 AM

## 2014-04-16 ENCOUNTER — Telehealth (HOSPITAL_BASED_OUTPATIENT_CLINIC_OR_DEPARTMENT_OTHER): Payer: Self-pay | Admitting: Emergency Medicine

## 2014-04-16 NOTE — Telephone Encounter (Signed)
Post ED Visit - Positive Culture Follow-up: Successful Patient Follow-Up  Culture assessed and recommendations reviewed by: []  Wes Dulaney, Pharm.D., BCPS []  Celedonio MiyamotoJeremy Frens, [address] BCPS []  Georgina PillionElizabeth Martin, [address]Ph[address] BCPS []  PaukaaMinh Pham, [address]Ph[address]D., BCPS, AAHIVP [x]  Estella HuskMichelle Turner, Pharm.D., BCPS, AAHIVP []  Red ChristiansSamson Lee, Pharm.D. []  Cassie Roseanne RenoStewart, [place]arm[place]D.  Positive urine culture Pseudomonas  []  Patient discharged without antimicrobial prescription and treatment is now indicated [x]  Organism is resistant to prescribed ED discharge antimicrobial []  Patient with positive blood cultures  Changes discussed with ED provider: Marlon Peliffany Greene PA  New antibiotic prescription Cipro 500mg  po bid x 7 days [facility]  Contacted sister, emergency contact  04/16/14 0910   Roy Hickman, Roy Hickman 04/16/2014, 9:12 AM

## 2014-04-26 ENCOUNTER — Ambulatory Visit (INDEPENDENT_AMBULATORY_CARE_PROVIDER_SITE_OTHER): Payer: Medicare Other | Admitting: Pharmacist Clinician (PhC)/ Clinical Pharmacy Specialist

## 2014-04-26 DIAGNOSIS — I82409 Acute embolism and thrombosis of unspecified deep veins of unspecified lower extremity: Secondary | ICD-10-CM

## 2014-04-26 LAB — POCT INR: INR: 3.7

## 2014-05-03 ENCOUNTER — Ambulatory Visit (INDEPENDENT_AMBULATORY_CARE_PROVIDER_SITE_OTHER): Payer: Medicare Other | Admitting: Pharmacist Clinician (PhC)/ Clinical Pharmacy Specialist

## 2014-05-03 DIAGNOSIS — I82409 Acute embolism and thrombosis of unspecified deep veins of unspecified lower extremity: Secondary | ICD-10-CM

## 2014-05-03 LAB — POCT INR: INR: 2.4

## 2014-05-10 LAB — POCT INR: INR: 3.8

## 2014-05-13 ENCOUNTER — Ambulatory Visit (INDEPENDENT_AMBULATORY_CARE_PROVIDER_SITE_OTHER): Payer: Medicare Other | Admitting: Pharmacist Clinician (PhC)/ Clinical Pharmacy Specialist

## 2014-05-13 DIAGNOSIS — I82409 Acute embolism and thrombosis of unspecified deep veins of unspecified lower extremity: Secondary | ICD-10-CM

## 2014-05-17 LAB — POCT INR: INR: 3

## 2014-05-20 ENCOUNTER — Ambulatory Visit (INDEPENDENT_AMBULATORY_CARE_PROVIDER_SITE_OTHER): Payer: Medicare Other | Admitting: Pharmacist Clinician (PhC)/ Clinical Pharmacy Specialist

## 2014-05-20 DIAGNOSIS — I82409 Acute embolism and thrombosis of unspecified deep veins of unspecified lower extremity: Secondary | ICD-10-CM

## 2014-05-27 LAB — POCT INR: INR: 3

## 2014-05-29 ENCOUNTER — Ambulatory Visit (INDEPENDENT_AMBULATORY_CARE_PROVIDER_SITE_OTHER): Payer: Medicare Other | Admitting: Pharmacist Clinician (PhC)/ Clinical Pharmacy Specialist

## 2014-05-29 DIAGNOSIS — I82409 Acute embolism and thrombosis of unspecified deep veins of unspecified lower extremity: Secondary | ICD-10-CM

## 2014-06-10 ENCOUNTER — Ambulatory Visit (INDEPENDENT_AMBULATORY_CARE_PROVIDER_SITE_OTHER): Payer: Medicare Other | Admitting: Pharmacist Clinician (PhC)/ Clinical Pharmacy Specialist

## 2014-06-10 ENCOUNTER — Observation Stay (HOSPITAL_COMMUNITY)
Admission: EM | Admit: 2014-06-10 | Discharge: 2014-06-13 | Disposition: A | Discharge status: Home / Self Care | Payer: Medicare Other | Attending: Family Medicine | Admitting: Family Medicine

## 2014-06-10 ENCOUNTER — Encounter (HOSPITAL_COMMUNITY): Payer: Self-pay | Admitting: Emergency Medicine

## 2014-06-10 ENCOUNTER — Emergency Department (HOSPITAL_COMMUNITY): Payer: Medicare Other

## 2014-06-10 DIAGNOSIS — Z87891 Personal history of nicotine dependence: Secondary | ICD-10-CM | POA: Diagnosis not present

## 2014-06-10 DIAGNOSIS — D649 Anemia, unspecified: Secondary | ICD-10-CM | POA: Diagnosis present

## 2014-06-10 DIAGNOSIS — R778 Other specified abnormalities of plasma proteins: Secondary | ICD-10-CM

## 2014-06-10 DIAGNOSIS — I252 Old myocardial infarction: Secondary | ICD-10-CM | POA: Insufficient documentation

## 2014-06-10 DIAGNOSIS — Z7901 Long term (current) use of anticoagulants: Secondary | ICD-10-CM | POA: Diagnosis not present

## 2014-06-10 DIAGNOSIS — Z951 Presence of aortocoronary bypass graft: Secondary | ICD-10-CM | POA: Diagnosis not present

## 2014-06-10 DIAGNOSIS — R55 Syncope and collapse: Secondary | ICD-10-CM | POA: Diagnosis present

## 2014-06-10 DIAGNOSIS — I959 Hypotension, unspecified: Secondary | ICD-10-CM | POA: Diagnosis not present

## 2014-06-10 DIAGNOSIS — F0390 Unspecified dementia without behavioral disturbance: Secondary | ICD-10-CM | POA: Diagnosis not present

## 2014-06-10 DIAGNOSIS — R7989 Other specified abnormal findings of blood chemistry: Secondary | ICD-10-CM

## 2014-06-10 DIAGNOSIS — K219 Gastro-esophageal reflux disease without esophagitis: Secondary | ICD-10-CM | POA: Insufficient documentation

## 2014-06-10 DIAGNOSIS — I10 Essential (primary) hypertension: Secondary | ICD-10-CM | POA: Diagnosis not present

## 2014-06-10 DIAGNOSIS — Z23 Encounter for immunization: Secondary | ICD-10-CM | POA: Insufficient documentation

## 2014-06-10 DIAGNOSIS — I340 Nonrheumatic mitral (valve) insufficiency: Secondary | ICD-10-CM | POA: Insufficient documentation

## 2014-06-10 DIAGNOSIS — Z79899 Other long term (current) drug therapy: Secondary | ICD-10-CM | POA: Diagnosis not present

## 2014-06-10 DIAGNOSIS — E785 Hyperlipidemia, unspecified: Secondary | ICD-10-CM | POA: Insufficient documentation

## 2014-06-10 DIAGNOSIS — Z86718 Personal history of other venous thrombosis and embolism: Secondary | ICD-10-CM | POA: Insufficient documentation

## 2014-06-10 DIAGNOSIS — E119 Type 2 diabetes mellitus without complications: Secondary | ICD-10-CM

## 2014-06-10 DIAGNOSIS — I2510 Atherosclerotic heart disease of native coronary artery without angina pectoris: Secondary | ICD-10-CM | POA: Diagnosis not present

## 2014-06-10 DIAGNOSIS — I82409 Acute embolism and thrombosis of unspecified deep veins of unspecified lower extremity: Secondary | ICD-10-CM

## 2014-06-10 HISTORY — DX: Unspecified dementia, unspecified severity, without behavioral disturbance, psychotic disturbance, mood disturbance, and anxiety: F03.90

## 2014-06-10 HISTORY — DX: Acute embolism and thrombosis of unspecified deep veins of left lower extremity: I82.402

## 2014-06-10 LAB — BASIC METABOLIC PANEL
Anion gap: 12 (ref 5–15)
BUN: 14 mg/dL (ref 6–23)
CO2: 26 meq/L (ref 19–32)
Calcium: 8.7 mg/dL (ref 8.4–10.5)
Chloride: 105 mEq/L (ref 96–112)
Creatinine, Ser: 1.07 mg/dL (ref 0.50–1.35)
GFR calc Af Amer: 76 mL/min — ABNORMAL LOW (ref >90)
GFR calc non Af Amer: 66 mL/min — ABNORMAL LOW (ref >90)
GLUCOSE: 203 mg/dL — AB (ref 70–99)
POTASSIUM: 3.6 meq/L — AB (ref 3.7–5.3)
Sodium: 143 mEq/L (ref 137–147)

## 2014-06-10 LAB — CBC WITH DIFFERENTIAL/PLATELET
Basophils Absolute: 0 10*3/uL (ref 0.0–0.1)
Basophils Relative: 0 % (ref 0–1)
EOS PCT: 1 % (ref 0–5)
Eosinophils Absolute: 0.1 10*3/uL (ref 0.0–0.7)
HCT: 28.9 % — ABNORMAL LOW (ref 39.0–52.0)
HEMOGLOBIN: 9.1 g/dL — AB (ref 13.0–17.0)
LYMPHS ABS: 0.6 10*3/uL — AB (ref 0.7–4.0)
LYMPHS PCT: 9 % — AB (ref 12–46)
MCH: 25.6 pg — ABNORMAL LOW (ref 26.0–34.0)
MCHC: 31.5 g/dL (ref 30.0–36.0)
MCV: 81.2 fL (ref 78.0–100.0)
MONO ABS: 0.4 10*3/uL (ref 0.1–1.0)
MONOS PCT: 7 % (ref 3–12)
NEUTROS ABS: 5.5 10*3/uL (ref 1.7–7.7)
Neutrophils Relative %: 83 % — ABNORMAL HIGH (ref 43–77)
Platelets: 252 10*3/uL (ref 150–400)
RBC: 3.56 MIL/uL — ABNORMAL LOW (ref 4.22–5.81)
RDW: 16.7 % — ABNORMAL HIGH (ref 11.5–15.5)
WBC: 6.6 10*3/uL (ref 4.0–10.5)

## 2014-06-10 LAB — URINALYSIS, ROUTINE W REFLEX MICROSCOPIC
Bilirubin Urine: NEGATIVE
Glucose, UA: NEGATIVE mg/dL
Hgb urine dipstick: NEGATIVE
KETONES UR: NEGATIVE mg/dL
Leukocytes, UA: NEGATIVE
NITRITE: NEGATIVE
PH: 7.5 (ref 5.0–8.0)
Protein, ur: NEGATIVE mg/dL
UROBILINOGEN UA: 0.2 mg/dL (ref 0.0–1.0)

## 2014-06-10 LAB — TROPONIN I: Troponin I: <0.3 ng/mL (ref <0.30)

## 2014-06-10 LAB — PROTIME-INR
INR: 2.3 — AB (ref 0.9–1.1)
INR: 2.18 — ABNORMAL HIGH (ref 0.00–1.49)
Prothrombin Time: 24.5 seconds — ABNORMAL HIGH (ref 11.6–15.2)

## 2014-06-10 LAB — POCT INR: INR: 2.3

## 2014-06-10 LAB — GLUCOSE, CAPILLARY: Glucose-Capillary: 120 mg/dL — ABNORMAL HIGH (ref 70–99)

## 2014-06-10 LAB — MRSA PCR SCREENING: MRSA by PCR: POSITIVE — AB

## 2014-06-10 MED ORDER — SODIUM CHLORIDE 0.9 % IV SOLN
INTRAVENOUS | Status: DC
  Administered 2014-06-10: 18:00:00 via INTRAVENOUS

## 2014-06-10 MED ORDER — WARFARIN - PHYSICIAN DOSING INPATIENT: Freq: Every day | Status: DC

## 2014-06-10 MED ORDER — ONDANSETRON HCL 4 MG PO TABS: 4.0000 mg | ORAL_TABLET | Freq: Four times a day (QID) | ORAL | Status: DC | PRN

## 2014-06-10 MED ORDER — SODIUM CHLORIDE 0.9 % IJ SOLN
3.0000 mL | Freq: Two times a day (BID) | INTRAMUSCULAR | Status: DC
  Administered 2014-06-10 – 2014-06-12 (×5): 3 mL via INTRAVENOUS

## 2014-06-10 MED ORDER — ACETAMINOPHEN 325 MG PO TABS: 650.0000 mg | ORAL_TABLET | Freq: Four times a day (QID) | ORAL | Status: DC | PRN

## 2014-06-10 MED ORDER — INSULIN ASPART 100 UNIT/ML ~~LOC~~ SOLN
0.0000 [IU] | Freq: Three times a day (TID) | SUBCUTANEOUS | Status: DC
  Administered 2014-06-11 – 2014-06-13 (×5): 2 [IU] via SUBCUTANEOUS

## 2014-06-10 MED ORDER — MUPIROCIN 2 % EX OINT
1.0000 "application " | TOPICAL_OINTMENT | Freq: Two times a day (BID) | CUTANEOUS | Status: DC
  Administered 2014-06-10 – 2014-06-13 (×5): 1 via NASAL

## 2014-06-10 MED ORDER — ASPIRIN EC 81 MG PO TBEC
81.0000 mg | DELAYED_RELEASE_TABLET | Freq: Every day | ORAL | Status: DC
  Administered 2014-06-10 – 2014-06-13 (×4): 81 mg via ORAL
  Filled 2014-06-10 (×4): qty 1

## 2014-06-10 MED ORDER — ONDANSETRON HCL 4 MG/2ML IJ SOLN: 4.0000 mg | Freq: Four times a day (QID) | INTRAMUSCULAR | Status: DC | PRN

## 2014-06-10 MED ORDER — CHLORHEXIDINE GLUCONATE CLOTH 2 % EX PADS
6.0000 | MEDICATED_PAD | Freq: Every day | CUTANEOUS | Status: DC
  Administered 2014-06-11 – 2014-06-13 (×3): 6 via TOPICAL

## 2014-06-10 MED ORDER — WARFARIN - PHARMACIST DOSING INPATIENT
Status: DC
  Administered 2014-06-11: 17:00:00

## 2014-06-10 MED ORDER — WARFARIN SODIUM 5 MG PO TABS
5.0000 mg | ORAL_TABLET | Freq: Every day | ORAL | Status: DC
  Administered 2014-06-10 – 2014-06-11 (×2): 5 mg via ORAL
  Filled 2014-06-10 (×2): qty 1

## 2014-06-10 MED ORDER — POTASSIUM CHLORIDE IN NACL 20-0.9 MEQ/L-% IV SOLN
INTRAVENOUS | Status: DC
  Administered 2014-06-10 – 2014-06-12 (×4): via INTRAVENOUS

## 2014-06-10 MED ORDER — MUPIROCIN 2 % EX OINT
1.0000 "application " | TOPICAL_OINTMENT | Freq: Three times a day (TID) | CUTANEOUS | Status: DC
  Administered 2014-06-10 – 2014-06-13 (×7): 1 via TOPICAL
  Filled 2014-06-10 (×2): qty 22

## 2014-06-10 MED ORDER — INSULIN ASPART 100 UNIT/ML ~~LOC~~ SOLN: 0.0000 [IU] | Freq: Every day | SUBCUTANEOUS | Status: DC

## 2014-06-10 MED ORDER — ATORVASTATIN CALCIUM 40 MG PO TABS
40.0000 mg | ORAL_TABLET | Freq: Every day | ORAL | Status: DC
  Administered 2014-06-10 – 2014-06-12 (×3): 40 mg via ORAL
  Filled 2014-06-10 (×3): qty 1

## 2014-06-10 MED ORDER — LEVOTHYROXINE SODIUM 25 MCG PO TABS
25.0000 ug | ORAL_TABLET | Freq: Every day | ORAL | Status: DC
  Administered 2014-06-11 – 2014-06-13 (×3): 25 ug via ORAL
  Filled 2014-06-10 (×3): qty 1

## 2014-06-10 MED ORDER — ACETAMINOPHEN 650 MG RE SUPP: 650.0000 mg | Freq: Four times a day (QID) | RECTAL | Status: DC | PRN

## 2014-06-10 MED ORDER — FOLIC ACID 1 MG PO TABS
1.0000 mg | ORAL_TABLET | Freq: Every day | ORAL | Status: DC
  Administered 2014-06-11 – 2014-06-13 (×3): 1 mg via ORAL
  Filled 2014-06-10 (×3): qty 1

## 2014-06-10 MED ORDER — PANTOPRAZOLE SODIUM 40 MG PO TBEC
40.0000 mg | DELAYED_RELEASE_TABLET | Freq: Every day | ORAL | Status: DC
  Administered 2014-06-11 – 2014-06-13 (×3): 40 mg via ORAL
  Filled 2014-06-10 (×3): qty 1

## 2014-06-10 MED ORDER — PNEUMOCOCCAL VAC POLYVALENT 25 MCG/0.5ML IJ INJ
0.5000 mL | INJECTION | INTRAMUSCULAR | Status: AC
  Administered 2014-06-11: 0.5 mL via INTRAMUSCULAR
  Filled 2014-06-10: qty 0.5

## 2014-06-10 MED ORDER — TAMSULOSIN HCL 0.4 MG PO CAPS
0.4000 mg | ORAL_CAPSULE | Freq: Every day | ORAL | Status: DC
  Administered 2014-06-10 – 2014-06-12 (×3): 0.4 mg via ORAL
  Filled 2014-06-10 (×3): qty 1

## 2014-06-10 MED ORDER — DULOXETINE HCL 60 MG PO CPEP
60.0000 mg | ORAL_CAPSULE | Freq: Every day | ORAL | Status: DC
  Administered 2014-06-11 – 2014-06-13 (×3): 60 mg via ORAL
  Filled 2014-06-10 (×3): qty 1

## 2014-06-10 MED ORDER — QUETIAPINE FUMARATE 25 MG PO TABS
50.0000 mg | ORAL_TABLET | Freq: Two times a day (BID) | ORAL | Status: DC
  Administered 2014-06-10 – 2014-06-13 (×6): 50 mg via ORAL
  Filled 2014-06-10 (×6): qty 2

## 2014-06-10 MED ORDER — POTASSIUM CHLORIDE CRYS ER 20 MEQ PO TBCR
40.0000 meq | EXTENDED_RELEASE_TABLET | Freq: Once | ORAL | Status: AC
  Administered 2014-06-10: 40 meq via ORAL
  Filled 2014-06-10: qty 2

## 2014-06-10 NOTE — ED Notes (Signed)
Patient is resting comfortably. 

## 2014-06-10 NOTE — ED Notes (Signed)
Patient given a drink per MD approval. 

## 2014-06-10 NOTE — ED Notes (Signed)
Arrives via EMS from home. Dizziness and syncope upon sitting up in chair today. H/o orthostasis. Given 250 ml bolus by EMS PTA. Arrives alert/confused (normal per family).

## 2014-06-10 NOTE — Progress Notes (Signed)
ANTICOAGULATION CONSULT NOTE - Initial Consult  Pharmacy Consult for Warfarin Indication: VTE prophylaxis / Hx DVT  No Known Allergies  Patient Measurements: Height: 6' (182.9 cm) Weight: 200 lb (90.719 kg) IBW/kg (Calculated) : 77.6  Vital Signs: Temp: 97.9 F (36.6 C) (12/07 1520) Temp Source: Oral (12/07 1520) BP: 146/92 mmHg (12/07 1520) Pulse Rate: 78 (12/07 1520)  Labs:  Recent Labs  06/10/14 06/10/14 1041  HGB  --  9.1*  HCT  --  28.9*  PLT  --  252  LABPROT  --  24.5*  INR 2.3 2.18*  CREATININE  --  1.07  TROPONINI  --  <0.30    Estimated Creatinine Clearance: 65.5 mL/min (by C-G formula based on Cr of 1.07).   Medical History: Past Medical History  Diagnosis Date  . Hypertension   . Diabetes mellitus   . MI (mitral incompetence)   . Myocardial infarction 2007    s/p CABG   . Coronary artery disease   . GERD (gastroesophageal reflux disease)   . Hyperlipidemia   . Left leg DVT   . Dementia     Medications:  Prescriptions prior to admission  Medication Sig Dispense Refill Last Dose  . atorvastatin (LIPITOR) 40 MG tablet Take 40 mg by mouth at bedtime.    06/09/2014 at Unknown time  . diphenhydrAMINE (BENADRYL) 25 MG tablet Take 25 mg by mouth every 6 (six) hours as needed for itching, allergies or sleep.    UNKNOWN  . DULoxetine (CYMBALTA) 60 MG capsule Take 60 mg by mouth daily.   06/10/2014 at Unknown time  . folic acid ([name]) 1 MG tablet Take 1 mg by mouth daily.    06/10/2014 at Unknown time  . levothyroxine (SYNTHROID, LEVOTHROID) 25 MCG tablet Take 25 mcg by mouth daily before breakfast.   06/10/2014 at Unknown time  . mupirocin ointment (BACTROBAN) 2 % Apply 1 application topically 3 (three) times daily.   06/09/2014 at Unknown time  . nystatin-triamcinolone (MYCOLOG II) cream Apply 1 application topically as needed (skin irritation).    Past Week at Unknown time  . omeprazole (PRILOSEC) 20 MG capsule Take 20 mg by mouth daily.   06/10/2014 at  Unknown time  . QUEtiapine (SEROQUEL) 50 MG tablet Take 50 mg by mouth 2 (two) times daily.    06/10/2014 at Unknown time  . Tamsulosin HCl (FLOMAX) 0.4 MG CAPS Take 0.4 mg by mouth at bedtime.    06/09/2014 at Unknown time  . warfarin (COUMADIN) 5 MG tablet Take 5 mg by mouth daily.    06/09/2014 at Unknown time  . cephALEXin (KEFLEX) 500 MG capsule Take 1 capsule (500 mg total) by mouth 2 (two) times daily. (Patient not taking: Reported on 06/10/2014) 14 capsule 0 Completed Course at Unknown time    Assessment: 73 yo male admitted for syncope.  On warfarin @ home for Hx DVT.  Goal of Therapy:  INR 2-3   Plan:  Warfarin 5mg  Daily as at home. Daily PT/INR  Mady GemmaHayes, Teriah Muela R 06/10/2014,7:00 PM

## 2014-06-10 NOTE — ED Provider Notes (Signed)
CSN: [id number]     Arrival date & time 06/10/14  1009 History   First MD Initiated Contact with Patient 06/10/14 1029     Chief Complaint  Patient presents with  . Hypotension  . Loss of Consciousness     Patient is a 73 y.o. male presenting with syncope. The history is provided by a caregiver, a relative, the patient and the EMS personnel. The history is limited by the condition of the patient (Hx dementia).  Loss of Consciousness Pt was seen at 1030. Per EMS, pt's family, and pt: family states they were sitting pt in a chair after helping him perform his morning ADL's when he "started to gag like he had to throw up" and "then just stared off into space and didn't respond." Pt's family states pt "closed his eyes and wouldn't respond to us." Family states pt "woke up and started to talk to us again" while they were on the phone with EMS. Pt remained sitting in the chair and did not fall to the floor. Family denies apnea, no seizure activity. Pt himself has hx of dementia and currently denies any complaints.    Past Medical History  Diagnosis Date  . Hypertension   . Diabetes mellitus   . MI (mitral incompetence)   . Myocardial infarction 2007    s/p CABG   . Coronary artery disease   . GERD (gastroesophageal reflux disease)   . Hyperlipidemia   . Left leg DVT   . Dementia    Past Surgical History  Procedure Laterality Date  . Elbow surgery      left  . Laminectomy    . Cervical spine surgery    . Coronary artery bypass graft  2008  . Back surgery    . Posterior cervical fusion/foraminotomy  07/21/2011    Procedure: POSTERIOR CERVICAL FUSION/FORAMINOTOMY LEVEL 5;  Surgeon: Karn CassisErnesto M Botero, MD;  Location: [facility];  Service: Neurosurgery;  Laterality: N/A;  Cervical three to Thoracic one Laminectomy, posterolateral arthrodesis, with Lateral Mass screws   Family History  Problem Relation Age of Onset  . Heart failure Father   . Heart failure Sister    History  Substance  Use Topics  . Smoking status: Former Smoker -- 2.00 packs/day for 25 years    Types: Cigarettes    Quit date: 02/27/1987  . Smokeless tobacco: Never Used  . Alcohol Use: No     Comment: quit drinking in 1984    Review of Systems  Unable to perform ROS: Dementia  Cardiovascular: Positive for syncope.      Allergies  Review of patient's allergies indicates no known allergies.  Home Medications   Prior to Admission medications   Medication Sig Start Date End Date Taking? Authorizing Provider  atorvastatin (LIPITOR) 40 MG tablet Take 40 mg by mouth at bedtime.    Yes Historical Provider, MD  diphenhydrAMINE (BENADRYL) 25 MG tablet Take 25 mg by mouth every 6 (six) hours as needed for itching, allergies or sleep.    Yes Historical Provider, MD  DULoxetine (CYMBALTA) 60 MG capsule Take 60 mg by mouth daily.   Yes Historical Provider, MD  folic acid ([name]) 1 MG tablet Take 1 mg by mouth daily.    Yes Historical Provider, MD  levothyroxine (SYNTHROID, LEVOTHROID) 25 MCG tablet Take 25 mcg by mouth daily before breakfast.   Yes Historical Provider, MD  mupirocin ointment (BACTROBAN) 2 % Apply 1 application topically 3 (three) times daily.   Yes Historical  Provider, MD  nystatin-triamcinolone (MYCOLOG II) cream Apply 1 application topically as needed (skin irritation).  12/18/12  Yes Historical Provider, MD  omeprazole (PRILOSEC) 20 MG capsule Take 20 mg by mouth daily.   Yes Historical Provider, MD  QUEtiapine (SEROQUEL) 50 MG tablet Take 50 mg by mouth 2 (two) times daily.  01/24/13  Yes Historical Provider, MD  Tamsulosin HCl (FLOMAX) 0.4 MG CAPS Take 0.4 mg by mouth at bedtime.    Yes Historical Provider, MD  warfarin (COUMADIN) 5 MG tablet Take 5 mg by mouth daily.    Yes Historical Provider, MD  cephALEXin (KEFLEX) 500 MG capsule Take 1 capsule (500 mg total) by mouth 2 (two) times daily. Patient not taking: Reported on 06/10/2014 04/10/14   Shon Batonourtney F Horton, MD   BP 153/89 mmHg   Pulse 88  Temp(Src) 97.8 F (36.6 C) (Oral)  Resp 11  Ht 6' (1.829 m)  Wt 200 lb (90.719 kg)  BMI 27.12 kg/m2  SpO2 100% Physical Exam 1035: Physical examination:  Nursing notes reviewed; Vital signs and O2 SAT reviewed;  Constitutional: Well developed, Well nourished, Well hydrated, In no acute distress; Head:  Normocephalic, atraumatic; Eyes: EOMI, PERRL, No scleral icterus; ENMT: Mouth and pharynx normal, Mucous membranes moist; Neck: Supple, Full range of motion, No lymphadenopathy; Cardiovascular: Regular rate and rhythm, No gallop; Respiratory: Breath sounds clear & equal bilaterally, No wheezes.  Speaking full sentences with ease, Normal respiratory effort/excursion; Chest: Nontender, Movement normal; Abdomen: Soft, Nontender, Nondistended, Normal bowel sounds; Genitourinary: No CVA tenderness; Extremities: Pulses normal, No tenderness, No edema, No calf edema or asymmetry.; Neuro: Awake, alert, confused re: time, place, events. Major CN grossly intact. No facial droop. Speech clear. Grips equal. Strength 5/5 equal bilat UE's and LE's..; Skin: Color normal, Warm, Dry.   ED Course  Procedures     EKG Interpretation None      MDM  MDM Reviewed: previous chart, nursing note and vitals Reviewed previous: labs and ECG Interpretation: labs, ECG and x-ray    Date: 06/10/2014  Rate: 89  Rhythm: normal sinus rhythm and premature ventricular contractions (PVC)  QRS Axis: normal  Intervals: normal  ST/T Wave abnormalities: normal  Conduction Disutrbances:none  Narrative Interpretation:   Old EKG Reviewed: unchanged; no significant changes from previous EKG dated 10/05/2013.   Results for orders placed or performed during the hospital encounter of 06/10/14  Basic metabolic panel  Result Value Ref Range   Sodium 143 137 - 147 mEq/L   Potassium 3.6 (L) 3.7 - 5.3 mEq/L   Chloride 105 96 - 112 mEq/L   CO2 26 19 - 32 mEq/L   Glucose, Bld 203 (H) 70 - 99 mg/dL   BUN 14 6 - 23 mg/dL    Creatinine, Ser 2.131.07 0.50 - 1.35 mg/dL   Calcium 8.7 8.4 - 08.610.5 mg/dL   GFR calc non Af Amer 66 (L) >90 mL/min   GFR calc Af Amer 76 (L) >90 mL/min   Anion gap 12 5 - 15  Troponin I  Result Value Ref Range   Troponin I <0.30 <0.30 ng/mL  CBC with Differential  Result Value Ref Range   WBC 6.6 4.0 - 10.5 K/uL   RBC 3.56 (L) 4.22 - 5.81 MIL/uL   Hemoglobin 9.1 (L) 13.0 - 17.0 g/dL   HCT 57.828.9 (L) 46.939.0 - 62.952.0 %   MCV 81.2 78.0 - 100.0 fL   MCH 25.6 (L) 26.0 - 34.0 pg   MCHC 31.5 30.0 - 36.0 g/dL  RDW 16.7 (H) 11.5 - 15.5 %   Platelets 252 150 - 400 K/uL   Neutrophils Relative % 83 (H) 43 - 77 %   Neutro Abs 5.5 1.7 - 7.7 K/uL   Lymphocytes Relative 9 (L) 12 - 46 %   Lymphs Abs 0.6 (L) 0.7 - 4.0 K/uL   Monocytes Relative 7 3 - 12 %   Monocytes Absolute 0.4 0.1 - 1.0 K/uL   Eosinophils Relative 1 0 - 5 %   Eosinophils Absolute 0.1 0.0 - 0.7 K/uL   Basophils Relative 0 0 - 1 %   Basophils Absolute 0.0 0.0 - 0.1 K/uL  Protime-INR  Result Value Ref Range   Prothrombin Time 24.5 (H) 11.6 - 15.2 seconds   INR 2.18 (H) 0.00 - 1.49  Urinalysis, Routine w reflex microscopic  Result Value Ref Range   Color, Urine YELLOW YELLOW   APPearance CLEAR CLEAR   Specific Gravity, Urine <1.005 (L) 1.005 - 1.030   pH 7.5 5.0 - 8.0   Glucose, UA NEGATIVE NEGATIVE mg/dL   Hgb urine dipstick NEGATIVE NEGATIVE   Bilirubin Urine NEGATIVE NEGATIVE   Ketones, ur NEGATIVE NEGATIVE mg/dL   Protein, ur NEGATIVE NEGATIVE mg/dL   Urobilinogen, UA 0.2 0.0 - 1.0 mg/dL   Nitrite NEGATIVE NEGATIVE   Leukocytes, UA NEGATIVE NEGATIVE   Dg Chest Port 1 View 06/10/2014   CLINICAL DATA:  Hypotension.  EXAM: PORTABLE CHEST - 1 VIEW  COMPARISON:  08/13/2011  FINDINGS: Patient has had median sternotomy and CABG. Heart size is normal. There is mild perihilar peribronchial thickening. No focal consolidations or pleural effusions. No pulmonary edema.  IMPRESSION: 1. Postoperative changes. 2.  No evidence for acute   abnormality.   Electronically Signed   By: Rosalie Gums M.D.   On: 06/10/2014 11:06    1245:  Pt remains at his baseline per family at bedside. H/H per baseline. INR therapeutic. Dx and testing d/w pt and family.  Questions answered.  Verb understanding, agreeable to admit. T/C to [name]. Kerry Hough, case discussed, including:  HPI, pertinent PM/SHx, VS/PE, dx testing, ED course and treatment:  Agreeable to admit, requests to write temporary orders, obtain observation tele bed to team 2.      Samuel Jester, DO 06/12/14 2307

## 2014-06-10 NOTE — H&P (Signed)
[name] History and Physical  Roy Stadeaul H Meloy GUR:[id number] DOB: [birth year] DOA: 06/10/2014  Referring physician: Emergency department PCP: Lajean SaverJONES, ANGIE L, NT   Chief Complaint: Syncope  HPI: Roy Hickman is a 73 y.o. male who has significant dementia and is unable to provide any history. Unfortunately no family is available at this time. History is obtained from the medical record. Patient was brought to the emergency room by EMS. According to the family, he was sitting in his chair at the breakfast table and began to gag and retch. He then began just staring off into space and did not respond. It was stated that he closed his eyes and would not respond. Before EMS arrival, the patient woke up and began to talk to his family again. There was no reported bowel or bladder incontinence. No jerking motions. It was felt that he would need further workup for syncope.   Review of Systems:  Unable to assess due to patient's dementia  Past Medical History  Diagnosis Date  . Hypertension   . Diabetes mellitus   . MI (mitral incompetence)   . Myocardial infarction 2007    s/p CABG   . Coronary artery disease   . GERD (gastroesophageal reflux disease)   . Hyperlipidemia   . Left leg DVT   . Dementia    Past Surgical History  Procedure Laterality Date  . Elbow surgery      left  . Laminectomy    . Cervical spine surgery    . Coronary artery bypass graft  2008  . Back surgery    . Posterior cervical fusion/foraminotomy  07/21/2011    Procedure: POSTERIOR CERVICAL FUSION/FORAMINOTOMY LEVEL 5;  Surgeon: Karn CassisErnesto M Botero, MD;  Location: [facility];  Service: Neurosurgery;  Laterality: N/A;  Cervical three to Thoracic one Laminectomy, posterolateral arthrodesis, with Lateral Mass screws   Social History:  reports that he quit smoking about 27 years ago. His smoking use included Cigarettes. He has a 50 pack-year smoking history. He has never used smokeless tobacco. He reports that he  does not drink alcohol or use illicit drugs.  No Known Allergies  Family History  Problem Relation Age of Onset  . Heart failure Father   . Heart failure Sister      Prior to Admission medications   Medication Sig Start Date End Date Taking? Authorizing Provider  atorvastatin (LIPITOR) 40 MG tablet Take 40 mg by mouth at bedtime.    Yes Historical Provider, MD  diphenhydrAMINE (BENADRYL) 25 MG tablet Take 25 mg by mouth every 6 (six) hours as needed for itching, allergies or sleep.    Yes Historical Provider, MD  DULoxetine (CYMBALTA) 60 MG capsule Take 60 mg by mouth daily.   Yes Historical Provider, MD  folic acid ([name]) 1 MG tablet Take 1 mg by mouth daily.    Yes Historical Provider, MD  levothyroxine (SYNTHROID, LEVOTHROID) 25 MCG tablet Take 25 mcg by mouth daily before breakfast.   Yes Historical Provider, MD  mupirocin ointment (BACTROBAN) 2 % Apply 1 application topically 3 (three) times daily.   Yes Historical Provider, MD  nystatin-triamcinolone (MYCOLOG II) cream Apply 1 application topically as needed (skin irritation).  12/18/12  Yes Historical Provider, MD  omeprazole (PRILOSEC) 20 MG capsule Take 20 mg by mouth daily.   Yes Historical Provider, MD  QUEtiapine (SEROQUEL) 50 MG tablet Take 50 mg by mouth 2 (two) times daily.  01/24/13  Yes Historical Provider, MD  Tamsulosin HCl (FLOMAX)  0.4 MG CAPS Take 0.4 mg by mouth at bedtime.    Yes Historical Provider, MD  warfarin (COUMADIN) 5 MG tablet Take 5 mg by mouth daily.    Yes Historical Provider, MD  cephALEXin (KEFLEX) 500 MG capsule Take 1 capsule (500 mg total) by mouth 2 (two) times daily. Patient not taking: Reported on 06/10/2014 04/10/14   Shon Baton, MD   Physical Exam: Filed Vitals:   06/10/14 1330 06/10/14 1400 06/10/14 1447 06/10/14 1520  BP: 141/66 157/88 156/79 146/92  Pulse: 76 77 77 78  Temp:    97.9 F (36.6 C)  TempSrc:    Oral  Resp: 13 12 14 22   Height:      Weight:      SpO2: 100% 100%  100% 100%    Wt Readings from Last 3 Encounters:  06/10/14 90.719 kg (200 lb)  10/05/13 91.944 kg (202 lb 11.2 oz)  02/19/13 93.985 kg (207 lb 3.2 oz)    General:  Appears calm and comfortable Eyes: PERRL, normal lids, irises & conjunctiva ENT: grossly normal hearing, lips & tongue Neck: no LAD, masses or thyromegaly Cardiovascular: RRR, no m/r/g. No LE edema. Telemetry: SR, no arrhythmias  Respiratory: CTA bilaterally, no w/r/r. Normal respiratory effort. Abdomen: soft, ntnd Skin: no rash or induration seen on limited exam Musculoskeletal: grossly normal tone BUE/BLE Psychiatric: grossly normal mood and affect, confused Neurologic: grossly non-focal.          Labs on Admission:  Basic Metabolic Panel:  Recent Labs Lab 06/10/14 1041  NA 143  K 3.6*  CL 105  CO2 26  GLUCOSE 203*  BUN 14  CREATININE 1.07  CALCIUM 8.7   Liver Function Tests: No results for input(s): AST, ALT, ALKPHOS, BILITOT, PROT, ALBUMIN in the last 168 hours. No results for input(s): LIPASE, AMYLASE in the last 168 hours. No results for input(s): AMMONIA in the last 168 hours. CBC:  Recent Labs Lab 06/10/14 1041  WBC 6.6  NEUTROABS 5.5  HGB 9.1*  HCT 28.9*  MCV 81.2  PLT 252   Cardiac Enzymes:  Recent Labs Lab 06/10/14 1041  TROPONINI <0.30    BNP (last 3 results) No results for input(s): PROBNP in the last 8760 hours. CBG: No results for input(s): GLUCAP in the last 168 hours.  Radiological Exams on Admission: Dg Chest Port 1 View  06/10/2014   CLINICAL DATA:  Hypotension.  EXAM: PORTABLE CHEST - 1 VIEW  COMPARISON:  08/13/2011  FINDINGS: Patient has had median sternotomy and CABG. Heart size is normal. There is mild perihilar peribronchial thickening. No focal consolidations or pleural effusions. No pulmonary edema.  IMPRESSION: 1. Postoperative changes. 2.  No evidence for acute  abnormality.   Electronically Signed   By: Rosalie Gums M.D.   On: 06/10/2014 11:06    EKG:  Independently reviewed. Unchanged from prior EKG  Assessment/Plan Active Problems:   DM2 (diabetes mellitus, type 2)   HTN (hypertension)   Anemia   Long term current use of anticoagulant therapy   Syncope   Dementia   1. Syncope. Possibly vasovagal. Monitor on telemetry. Check echocardiogram. Cycle cardiac markers. EKG is unremarkable. He does not appear to be orthostatic. Continue to monitor. Urinalysis is also unremarkable. He does not have any signs of infection. 2. Hypertension. Blood pressures currently stable. Continue outpatient regimen. 3. Diabetes. He does not have any hypoglycemia. Continue to follow blood sugars. Sliding scale insulin. 4. Dementia 5. Long-term use of anticoagulant therapy and history of  DVT. Continue Coumadin. 6. Anemia. Appears to be a chronic issue. Hemoglobin is currently her prior levels. This can be followed as an outpatient.   Code Status: full code DVT Prophylaxis: coumadin Family Communication: no family present Disposition Plan: discharge home once improved  Time spent: 50mins  [facility] CenterMEMON,JEHANZEB [name] Pager [phone]

## 2014-06-11 ENCOUNTER — Encounter (HOSPITAL_COMMUNITY): Payer: Self-pay | Admitting: Adult Health

## 2014-06-11 ENCOUNTER — Observation Stay (HOSPITAL_COMMUNITY): Payer: Medicare Other

## 2014-06-11 DIAGNOSIS — R778 Other specified abnormalities of plasma proteins: Secondary | ICD-10-CM

## 2014-06-11 DIAGNOSIS — R55 Syncope and collapse: Secondary | ICD-10-CM | POA: Diagnosis not present

## 2014-06-11 DIAGNOSIS — I959 Hypotension, unspecified: Secondary | ICD-10-CM | POA: Diagnosis not present

## 2014-06-11 DIAGNOSIS — E119 Type 2 diabetes mellitus without complications: Secondary | ICD-10-CM | POA: Diagnosis not present

## 2014-06-11 DIAGNOSIS — R7989 Other specified abnormal findings of blood chemistry: Secondary | ICD-10-CM

## 2014-06-11 DIAGNOSIS — I10 Essential (primary) hypertension: Secondary | ICD-10-CM | POA: Diagnosis not present

## 2014-06-11 LAB — CBC
HEMATOCRIT: 27.7 % — AB (ref 39.0–52.0)
Hemoglobin: 8.8 g/dL — ABNORMAL LOW (ref 13.0–17.0)
MCH: 25.7 pg — AB (ref 26.0–34.0)
MCHC: 31.8 g/dL (ref 30.0–36.0)
MCV: 80.8 fL (ref 78.0–100.0)
Platelets: 271 10*3/uL (ref 150–400)
RBC: 3.43 MIL/uL — ABNORMAL LOW (ref 4.22–5.81)
RDW: 16.7 % — AB (ref 11.5–15.5)
WBC: 5.7 10*3/uL (ref 4.0–10.5)

## 2014-06-11 LAB — BASIC METABOLIC PANEL
ANION GAP: 10 (ref 5–15)
BUN: 12 mg/dL (ref 6–23)
CHLORIDE: 108 meq/L (ref 96–112)
CO2: 25 mEq/L (ref 19–32)
Calcium: 8.7 mg/dL (ref 8.4–10.5)
Creatinine, Ser: 0.87 mg/dL (ref 0.50–1.35)
GFR, EST NON AFRICAN AMERICAN: 82 mL/min — AB (ref >90)
Glucose, Bld: 139 mg/dL — ABNORMAL HIGH (ref 70–99)
POTASSIUM: 3.6 meq/L — AB (ref 3.7–5.3)
SODIUM: 143 meq/L (ref 137–147)

## 2014-06-11 LAB — GLUCOSE, CAPILLARY
GLUCOSE-CAPILLARY: 154 mg/dL — AB (ref 70–99)
Glucose-Capillary: 122 mg/dL — ABNORMAL HIGH (ref 70–99)
Glucose-Capillary: 139 mg/dL — ABNORMAL HIGH (ref 70–99)
Glucose-Capillary: 111 mg/dL — ABNORMAL HIGH (ref 70–99)

## 2014-06-11 LAB — MAGNESIUM: MAGNESIUM: 1.3 mg/dL — AB (ref 1.5–2.5)

## 2014-06-11 LAB — URINE CULTURE
CULTURE: NO GROWTH
Colony Count: NO GROWTH

## 2014-06-11 LAB — HEMOGLOBIN A1C
Hgb A1c MFr Bld: 7.1 % — ABNORMAL HIGH (ref <5.7)
MEAN PLASMA GLUCOSE: 157 mg/dL — AB (ref <117)

## 2014-06-11 LAB — TROPONIN I
Troponin I: 0.36 ng/mL (ref <0.30)
Troponin I: 0.43 ng/mL (ref <0.30)

## 2014-06-11 LAB — PROTIME-INR
INR: 2.52 — AB (ref 0.00–1.49)
Prothrombin Time: 27.4 seconds — ABNORMAL HIGH (ref 11.6–15.2)

## 2014-06-11 LAB — TSH: TSH: 3.85 u[IU]/mL (ref 0.350–4.500)

## 2014-06-11 MED ORDER — CARVEDILOL 3.125 MG PO TABS
3.1250 mg | ORAL_TABLET | Freq: Two times a day (BID) | ORAL | Status: DC
  Administered 2014-06-11 – 2014-06-13 (×4): 3.125 mg via ORAL
  Filled 2014-06-11 (×4): qty 1

## 2014-06-11 MED ORDER — POTASSIUM CHLORIDE CRYS ER 20 MEQ PO TBCR
40.0000 meq | EXTENDED_RELEASE_TABLET | Freq: Once | ORAL | Status: AC
  Administered 2014-06-11: 40 meq via ORAL
  Filled 2014-06-11: qty 2

## 2014-06-11 MED ORDER — MAGNESIUM SULFATE 4 GM/100ML IV SOLN
4.0000 g | INTRAVENOUS | Status: AC
  Administered 2014-06-11: 4 g via INTRAVENOUS
  Filled 2014-06-11: qty 100

## 2014-06-11 NOTE — Progress Notes (Signed)
ANTICOAGULATION CONSULT NOTE - Initial Consult  Pharmacy Consult for Warfarin Indication: VTE prophylaxis / Hx DVT  No Known Allergies  Patient Measurements: Height: 6' (182.9 cm) Weight: 200 lb (90.719 kg) IBW/kg (Calculated) : 77.6  Vital Signs: Temp: 97.6 F (36.4 C) (12/08 0533) Temp Source: Oral (12/08 0533) BP: 134/95 mmHg (12/08 0533) Pulse Rate: 84 (12/08 0533)  Labs:  Recent Labs  06/10/14  06/10/14 1041 06/10/14 1903 06/11/14 06/11/14 0545  HGB  --   --  9.1*  --   --  8.8*  HCT  --   --  28.9*  --   --  27.7*  PLT  --   --  252  --   --  271  LABPROT  --   --  24.5*  --   --  27.4*  INR 2.3  --  2.18*  --   --  2.52*  CREATININE  --   --  1.07  --   --  0.87  TROPONINI  --   < > <0.30 <0.30 0.43* 0.36*  < > = values in this interval not displayed.  Estimated Creatinine Clearance: 80.5 mL/min (by C-G formula based on Cr of 0.87).   Medical History: Past Medical History  Diagnosis Date  . Hypertension   . Diabetes mellitus   . MI (mitral incompetence)   . Myocardial infarction 2007    s/p CABG   . Coronary artery disease   . GERD (gastroesophageal reflux disease)   . Hyperlipidemia   . Left leg DVT   . Dementia     Medications:  Prescriptions prior to admission  Medication Sig Dispense Refill Last Dose  . atorvastatin (LIPITOR) 40 MG tablet Take 40 mg by mouth at bedtime.    06/09/2014 at Unknown time  . diphenhydrAMINE (BENADRYL) 25 MG tablet Take 25 mg by mouth every 6 (six) hours as needed for itching, allergies or sleep.    UNKNOWN  . DULoxetine (CYMBALTA) 60 MG capsule Take 60 mg by mouth daily.   06/10/2014 at Unknown time  . folic acid ([name]) 1 MG tablet Take 1 mg by mouth daily.    06/10/2014 at Unknown time  . levothyroxine (SYNTHROID, LEVOTHROID) 25 MCG tablet Take 25 mcg by mouth daily before breakfast.   06/10/2014 at Unknown time  . mupirocin ointment (BACTROBAN) 2 % Apply 1 application topically 3 (three) times daily.   06/09/2014 at  Unknown time  . nystatin-triamcinolone (MYCOLOG II) cream Apply 1 application topically as needed (skin irritation).    Past Week at Unknown time  . omeprazole (PRILOSEC) 20 MG capsule Take 20 mg by mouth daily.   06/10/2014 at Unknown time  . QUEtiapine (SEROQUEL) 50 MG tablet Take 50 mg by mouth 2 (two) times daily.    06/10/2014 at Unknown time  . Tamsulosin HCl (FLOMAX) 0.4 MG CAPS Take 0.4 mg by mouth at bedtime.    06/09/2014 at Unknown time  . warfarin (COUMADIN) 5 MG tablet Take 5 mg by mouth daily.    06/09/2014 at Unknown time  . cephALEXin (KEFLEX) 500 MG capsule Take 1 capsule (500 mg total) by mouth 2 (two) times daily. (Patient not taking: Reported on 06/10/2014) 14 capsule 0 Completed Course at Unknown time   Assessment: 73 yo male admitted for syncope.  On warfarin @ home for Hx DVT.  INR @ goal.  Goal of Therapy:  INR 2-3   Plan:  Continue Warfarin 5mg  Daily as at home. Daily PT/INR  Madilyn FiremanHayes,  Casimiro NeedleMichael R 06/11/2014,10:19 AM

## 2014-06-11 NOTE — Progress Notes (Signed)
UR completed 

## 2014-06-11 NOTE — Care Management Note (Addendum)
    Page 1 of 1   06/13/2014     2:30:12 PM CARE MANAGEMENT NOTE 06/13/2014  Patient:  Wendall StadeHICKS,Matthieu H   Account Number:  [id number]  Date Initiated:  06/11/2014  Documentation initiated by:  Kathyrn SheriffHILDRESS,JESSICA  Subjective/Objective Assessment:   Pt is from home and admitted after a syncopal episode witnessed by a caregiver. Pt has an AID/RN/CM though the CAP program. Pt's neice stays with him and night and the AID comes during the day. Spoke with neice over phone to obtain informat.     Action/Plan:   Pt is confused and unable to provide information. Pt has hospital bed at home. Pt has no CM needs at this time. Plan for possible discharge home with resumption of services today.   Anticipated DC Date:  06/11/2014   Anticipated DC Plan:  HOME/SELF CARE      DC Planning Services  CM consult      PAC Choice  Resumption Of Svcs/PTA Adrieanna Boteler   Choice offered to / List presented to:          [facility]  HH-4 NURSE'S AIDE  HH-1 RN  HH-9 CAPS PROGRAM      Status of service:  Completed, signed off Medicare Important Message given?  YES (If response is "NO", the following Medicare IM given date fields will be blank) Date Medicare IM given:  06/13/2014 Medicare IM given by:  Kathyrn SheriffHILDRESS,JESSICA Date Additional Medicare IM given:   Additional Medicare IM given by:    Discharge Disposition:  HOME/SELF CARE  Per UR Regulation:    If discussed at Long Length of Stay Meetings, dates discussed:    Comments:   06/12/2014 1500 Kathyrn SheriffJessica Childress, RN, MSN, PCCN Pending 2D echo results, pt plans to discharge home today with [facility]. No CM needs identified at this time.  06/11/2014 1400 Kathyrn SheriffJessica Childress, RN, MSN, [facility]

## 2014-06-11 NOTE — Progress Notes (Signed)
[name] PROGRESS NOTE  Roy Hickman ZOX:[id number] DOB: 28-Apr-1941 DOA: 06/10/2014 PCP: Lajean SaverJONES, ANGIE L, NT  Assessment/Plan: 1. Syncope. Etiology is not entirely clear. Possibly related to vasovagal episode. Dehydration could be playing a role. CT scan of the head was negative for acute findings. He has not had any further symptoms. Continue to monitor on telemetry overnight. 2. Elevated troponin, likely demand ischemia. Appreciate cardiology input. Echocardiogram has been ordered. Await further recommendations for further cardiac testing if felt appropriate. 3. Essential hypertension. Blood pressure currently stable. 4. Diabetes. No evidence of hypoglycemia prior to admission. Continue sliding scale insulin. 5. Advanced dementia 6. Long-term use of anticoagulant therapy and history of DVT. INR is therapeutic. Continue Coumadin. 7. Anemia, appears to be a chronic issue. Hemoglobin is near prior levels. Continue to follow as an outpatient  Code Status: Full code Family Communication: Discussed with family at the bedside Disposition Plan: Discharge home once improved   Consultants:  Cardiology  Procedures:    Antibiotics:    HPI/Subjective: Patient denies any complaints. Confused and cannot provide much history  Objective: Filed Vitals:   06/11/14 1451  BP: 152/72  Pulse: 79  Temp:   Resp: 18    Intake/Output Summary (Last 24 hours) at 06/11/14 1916 Last data filed at 06/11/14 0600  Gross per 24 hour  Intake  797.5 ml  Output      0 ml  Net  797.5 ml   Filed Weights   06/10/14 1012  Weight: 90.719 kg (200 lb)    Exam:   General:  NAD  Cardiovascular: S1, S2 RRR  Respiratory: CTA B  Abdomen: soft, nt, nd, bs+  Musculoskeletal: no edema b/l   Data Reviewed: Basic Metabolic Panel:  Recent Labs Lab 06/10/14 1041 06/11/14 0545  NA 143 143  K 3.6* 3.6*  CL 105 108  CO2 26 25  GLUCOSE 203* 139*  BUN 14 12  CREATININE 1.07 0.87  CALCIUM  8.7 8.7  MG  --  1.3*   Liver Function Tests: No results for input(s): AST, ALT, ALKPHOS, BILITOT, PROT, ALBUMIN in the last 168 hours. No results for input(s): LIPASE, AMYLASE in the last 168 hours. No results for input(s): AMMONIA in the last 168 hours. CBC:  Recent Labs Lab 06/10/14 1041 06/11/14 0545  WBC 6.6 5.7  NEUTROABS 5.5  --   HGB 9.1* 8.8*  HCT 28.9* 27.7*  MCV 81.2 80.8  PLT 252 271   Cardiac Enzymes:  Recent Labs Lab 06/10/14 1041 06/10/14 1903 06/11/14 06/11/14 0545  TROPONINI <0.30 <0.30 0.43* 0.36*   BNP (last 3 results) No results for input(s): PROBNP in the last 8760 hours. CBG:  Recent Labs Lab 06/10/14 2051 06/11/14 0727 06/11/14 1114 06/11/14 1631  GLUCAP 120* 122* 139* 111*    Recent Results (from the past 240 hour(s))  Urine culture     Status: None   Collection Time: 06/10/14 12:27 PM  Result Value Ref Range Status   Specimen Description URINE, CATHETERIZED  Final   Special Requests NONE  Final   Culture  Setup Time   Final    06/10/2014 22:08 Performed at [facility]    Colony Count NO GROWTH Performed at [facility]   Final   Culture NO GROWTH Performed at [facility]   Final   Report Status 06/11/2014 FINAL  Final  MRSA PCR Screening     Status: Abnormal   Collection Time: 06/10/14  3:50 PM  Result Value Ref Range  Status   MRSA by PCR POSITIVE (A) NEGATIVE Final    Comment:        The GeneXpert MRSA Assay (FDA approved for NASAL specimens only), is one component of a comprehensive MRSA colonization surveillance program. It is not intended to diagnose MRSA infection nor to guide or monitor treatment for MRSA infections. RESULT CALLED TO, READ BACK BY AND VERIFIED WITH: FIRNK L AT 2028 ON [id number] BY FORSYTH K      Studies: Ct Head Wo Contrast  06/11/2014   CLINICAL DATA:  73 year old with syncope, on Coumadin  EXAM: CT HEAD WITHOUT CONTRAST  TECHNIQUE: Contiguous axial images were  obtained from the base of the skull through the vertex without intravenous contrast.  COMPARISON:  Correlation with brain MRI from 08/19/2011  FINDINGS: There is diffuse, confluent, periventricular white matter hypodensity, compatible with chronic small vessel white matter disease.  Moderate cerebral and cerebellar is noted.  The gray-white differentiation is preserved.  There is no evidence of an acute infarct.  There is no acute intracranial hemorrhage.  There is no midline shift or mass effect.  There is no extra-axial fluid collection.  The paranasal sinuses and mastoid air cells are aerated.  The orbits are intact.  IMPRESSION: 1. No acute intracranial hemorrhage. 2. Cerebral and cerebellar atrophy. 3. Extensive chronic small vessel white matter change.   Electronically Signed   By: Fannie KneeKenneth  Crosby   On: 06/11/2014 14:53   Dg Chest Port 1 View  06/10/2014   CLINICAL DATA:  Hypotension.  EXAM: PORTABLE CHEST - 1 VIEW  COMPARISON:  08/13/2011  FINDINGS: Patient has had median sternotomy and CABG. Heart size is normal. There is mild perihilar peribronchial thickening. No focal consolidations or pleural effusions. No pulmonary edema.  IMPRESSION: 1. Postoperative changes. 2.  No evidence for acute  abnormality.   Electronically Signed   By: Rosalie GumsBeth  Brown M.D.   On: 06/10/2014 11:06    Scheduled Meds: . aspirin EC  81 mg Oral Daily  . atorvastatin  40 mg Oral QHS  . carvedilol  3.125 mg Oral BID WC  . Chlorhexidine Gluconate Cloth  6 each Topical Q0600  . DULoxetine  60 mg Oral Daily  . folic acid  1 mg Oral Daily  . insulin aspart  0-15 Units Subcutaneous TID WC  . insulin aspart  0-5 Units Subcutaneous QHS  . levothyroxine  25 mcg Oral QAC breakfast  . mupirocin ointment  1 application Topical TID  . mupirocin ointment  1 application Nasal BID  . pantoprazole  40 mg Oral Daily  . QUEtiapine  50 mg Oral BID  . sodium chloride  3 mL Intravenous Q12H  . tamsulosin  0.4 mg Oral QHS  . warfarin  5  mg Oral q1800  . Warfarin - Pharmacist Dosing Inpatient   Does not apply Q24H   Continuous Infusions: . 0.9 % NaCl with KCl 20 mEq / L 75 mL/hr at 06/11/14 0930    Active Problems:   DM2 (diabetes mellitus, type 2)   HTN (hypertension)   Anemia   Long term current use of anticoagulant therapy   Syncope   Dementia   Elevated troponin   Faintness    Time spent: 35mins    MEMON,JEHANZEB  [name] Pager [phone]. If 7PM-7AM, please contact night-coverage at [email or web address], password [email or web address] 06/11/2014, 7:16 PM  LOS: 1 day

## 2014-06-11 NOTE — Progress Notes (Signed)
RT was down in ED when EKG ordered for this patient- when i went up on the floor and talked to RN she said it was fine to wait until his AM EKG.

## 2014-06-11 NOTE — Consult Note (Signed)
CARDIOLOGY CONSULT NOTE   Patient ID: Roy Hickman MRN: [id number] DOB/AGE: October 13, 1940 73 y.o.  Admit Date: 06/10/2014 Referring Physician: PTH-Memon  Primary Physician: [name], NT Consulting Cardiologist: Dina Rich MD Primary Cardiologist: Nanetta Batty MD Reason for Consultation: Elevated troponin and questionable syncopal episode.  Clinical Summary Roy Hickman is a 73 y.o.male history of hypertension, diabetes, CAD with 2 vessel  CABG (2007), ICM, Left DVT involving the common femoral femoral and profunda femoral popliteal and saphenous veins. On coumadin, admitted via ER from SNF. Patient has dementia and is pleasantly confused. No memory of events. States "my stomach messed up."  after retching and gagging while eating breakfast, with open-eyed unresponsiveness at first, followed by unconsciousness. He initially had normal troponin X 2, followed by one troponin 0.43 and 0.36 this am. We are asked for cardiology advisement.  In ER he was found initially to be normotensive, but had elevation during ER evaluation. Potassium 3.6, Glucose 203, GFR 66. Anemic at 9.1, 28.9. INR 2.18. CXR negative for pneumonia or CHF.Magnesium this am 1.3.   He was last seen by Dr. Allyson Sabal in April of 2015 with notes revealing he had a NM stress 07/09/2011 test before office visit for cardiac evaluation prior to C-Spine surgery. This demonstrated inferolateral scar without ischemia.Echo 07/09/2011  demonstrated EF of 40%-45%. EKG NSR with PAC's.  No Known Allergies  Medications Scheduled Medications: . aspirin EC  81 mg Oral Daily  . atorvastatin  40 mg Oral QHS  . Chlorhexidine Gluconate Cloth  6 each Topical Q0600  . DULoxetine  60 mg Oral Daily  . folic acid  1 mg Oral Daily  . insulin aspart  0-15 Units Subcutaneous TID WC  . insulin aspart  0-5 Units Subcutaneous QHS  . levothyroxine  25 mcg Oral QAC breakfast  . magnesium sulfate 1 - 4 g bolus IVPB  4 g Intravenous STAT  . mupirocin  ointment  1 application Topical TID  . mupirocin ointment  1 application Nasal BID  . pantoprazole  40 mg Oral Daily  . QUEtiapine  50 mg Oral BID  . sodium chloride  3 mL Intravenous Q12H  . tamsulosin  0.4 mg Oral QHS  . warfarin  5 mg Oral q1800  . Warfarin - Pharmacist Dosing Inpatient   Does not apply Q24H    Infusions: . 0.9 % NaCl with KCl 20 mEq / L 75 mL/hr at 06/10/14 2058    PRN Medications: acetaminophen **OR** acetaminophen, ondansetron **OR** ondansetron (ZOFRAN) IV   Past Medical History  Diagnosis Date  . Hypertension   . Diabetes mellitus   . MI (mitral incompetence)   . Myocardial infarction 2007    s/p CABG   . Coronary artery disease   . GERD (gastroesophageal reflux disease)   . Hyperlipidemia   . Left leg DVT   . Dementia     Past Surgical History  Procedure Laterality Date  . Elbow surgery      left  . Laminectomy    . Cervical spine surgery    . Coronary artery bypass graft  2007    LIMA to his LAD, and vein to a diagonal Roy Hickman and PDA. His EF at that time was 35% to 40%.   . Back surgery    . Posterior cervical fusion/foraminotomy  07/21/2011    Procedure: POSTERIOR CERVICAL FUSION/FORAMINOTOMY LEVEL 5;  Surgeon: Karn Cassis, MD;  Location: [facility];  Service: Neurosurgery;  Laterality: N/A;  Cervical three  to Thoracic one Laminectomy, posterolateral arthrodesis, with Lateral Mass screws    Family History  Problem Relation Age of Onset  . Heart failure Father   . Heart failure Sister     Social History Roy Hickman reports that he quit smoking about 27 years ago. His smoking use included Cigarettes. He has a 50 pack-year smoking history. He has never used smokeless tobacco. Roy Hickman reports that he does not drink alcohol.  Review of Systems Complete review of systems are found to be negative unless outlined in H&P above.  Physical Examination Blood pressure 134/95, pulse 84, temperature 97.6 F (36.4 C), temperature source  Oral, resp. rate 18, height 6' (1.829 m), weight 200 lb (90.719 kg), SpO2 98 %.  Intake/Output Summary (Last 24 hours) at 06/11/14 1141 Last data filed at 06/11/14 0600  Gross per 24 hour  Intake  917.5 ml  Output    200 ml  Net  717.5 ml    Telemetry:NSR  GEN:No acute distress HEENT: Conjunctiva and lids normal, oropharynx clear with moist mucosa. Neck: Supple, no elevated JVP or carotid bruits, no thyromegaly. Lungs: Clear to auscultation, nonlabored breathing at rest. Cardiac: Regular rate and rhythm, distant heart sounds no S3 or significant systolic murmur, no pericardial rub. Abdomen: Soft, nontender, no hepatomegaly, bowel sounds present, no guarding or rebound. Extremities: No pitting edema, distal pulses 2+.Multiple areas of ecchymosis.  Skin: Warm and dry. Musculoskeletal: No kyphosis. Neuropsychiatric: Alert and oriented x3, affect grossly appropriate.  Prior Cardiac Testing/Procedures  Cardiac Cath 05/15/2006 1. Fluoroscopy: There was fluoroscopic calcification in the left main  proximal circumflex, LAD and the right coronary artery. 2. Left main: Left main appeared smoothly tapered down to the  trifurcation in the 60-70% range. 3. LAD: The LAD was a diffusely diseased vessel. There was of 50-60%  segmental proximal and mid-stenosis. There was a moderate-sized first  diagonal Roy Hickman. There was bifurcating with moderate disease in its  proximal portion. 4. Ramus Roy Hickman: Small and free of significant disease. 5. Left circumflex: This was the infarct-related artery. There was a 95%  ostial stenosis that appeared hazy and possibly thrombotic. The  vessel was then occluded in its midportion just after the takeoff of  the atrial Roy Hickman. 6. The right coronary: A large dominant vessel with 70-80% segmental mid-  stenosis. The PDA was segmentally disease as well as 70-80% in the  proximal midportion. The 2-3 PLA  branches had only mild disease.  LEFT VENTRICULOGRAPHY: RAO and LAO left ventriculograms were performed using 20 mL of Visipaque dye at 10 mL per second in each view. There was a moderate lateral/posterolateral hypokinesia. The estimated EF was in the 50% range.  Lab Results  Basic Metabolic Panel:  Recent Labs Lab 06/10/14 1041 06/11/14 0545  NA 143 143  K 3.6* 3.6*  CL 105 108  CO2 26 25  GLUCOSE 203* 139*  BUN 14 12  CREATININE 1.07 0.87  CALCIUM 8.7 8.7  MG  --  1.3*    CBC:  Recent Labs Lab 06/10/14 1041 06/11/14 0545  WBC 6.6 5.7  NEUTROABS 5.5  --   HGB 9.1* 8.8*  HCT 28.9* 27.7*  MCV 81.2 80.8  PLT 252 271    Cardiac Enzymes:  Recent Labs Lab 06/10/14 1041 06/10/14 1903 06/11/14 06/11/14 0545  TROPONINI <0.30 <0.30 0.43* 0.36*     Radiology: Dg Chest Port 1 View  06/10/2014   CLINICAL DATA:  Hypotension.  EXAM: PORTABLE CHEST - 1 VIEW  COMPARISON:  08/13/2011  FINDINGS: Patient has had median sternotomy and CABG. Heart size is normal. There is mild perihilar peribronchial thickening. No focal consolidations or pleural effusions. No pulmonary edema.  IMPRESSION: 1. Postoperative changes. 2.  No evidence for acute  abnormality.   Electronically Signed   By: Rosalie GumsBeth  Brown M.D.   On: 06/10/2014 11:06    ECG: NSR with PAC;s   Impression and Recommendations  1. Elevated troponin: Likely demand ischemia. Trending downward. No acute changes on EKG.  Cannot rule out cardiac arhythmia is the setting of hypomagnesia and hypokalemia causing unresponsiveness.  Will replete magnesium IV ASAP IV now. Will repeat echo to evaluate EF status to assist with medical management.   2. CAD: S/P CABG: Had two vessel bypass in 2007. Follow up NM study in 2013 negative for new ischemia. Will continue medical management. Currently not on BB per home list or admission list, but was on carvedilol 12.5 mg BID per last office not of Dr. Allyson SabalBerry.  Remains on statin.  Would  restart at low dose 3.125mg  BID. Do not see where or why he was taken off on review of prior notes.   3. Hx of DVT: Continues on coumadin. Doubt CVA causing syncope, but CT of head if concerns for bleeding. INR 2.52 this am.   4.Anemia: Hgb 9.1 on admission. See no history of this on review of records. Last documented Hgb 9.5 in October. Hemoccult stools.   Signed: Bettey MareKathryn M. Lawrence NP [facility]    06/11/2014, 11:41 AM Co-Sign MD  Patient seen and discussed with NP Lyman BishopLawrence, I agree with her documentation. 73 yo male hx of severe dementia, CAD with prior CABG in 2007, HL, DM, hx of DVT. Do not see prior echo in our system, LVEF from cath 2007 was 50%. Admitted with syncopal episode. Patient does not recall specifics. Family reports they had stood him up to get into his lift chair and he became altered. They placed him on the floor where he proceeded to have severe dry heaving episodes, and then became unresponsive. Patient denies any recent chest pain, no palpitations, no SOB or DOE. He is mainly bed bound but can get around in his wheelchair.   INR 2.18, Hgb 9.1, Plt 252, K 3.6 Cr 1.07 BUN 14 GFR 66  Trop 0.43 peak.  CXR no acute process EKG SR old inferior infarct  Unclear etiology of syncope at this time. The fact it occurred shortly after standing and was associated with dry heaving suggests possible vasovagal syncope. He reports he mainly drinks coffee and sodas at home, dehdydration may have played a role.  Mild troponin elevation, could be demand ischemia. Will f/u echo results, monitor on telemetry. Further plans per these results, given severe dementia would likely favor conservative management. With syncopal episode on anticoag would recommend CT head.   Dominga FerryJ Itzael Liptak MD

## 2014-06-12 DIAGNOSIS — Z7901 Long term (current) use of anticoagulants: Secondary | ICD-10-CM | POA: Insufficient documentation

## 2014-06-12 DIAGNOSIS — R55 Syncope and collapse: Secondary | ICD-10-CM | POA: Diagnosis not present

## 2014-06-12 DIAGNOSIS — I517 Cardiomegaly: Secondary | ICD-10-CM

## 2014-06-12 LAB — CBC
HCT: 27.7 % — ABNORMAL LOW (ref 39.0–52.0)
Hemoglobin: 8.8 g/dL — ABNORMAL LOW (ref 13.0–17.0)
MCH: 25.8 pg — AB (ref 26.0–34.0)
MCHC: 31.8 g/dL (ref 30.0–36.0)
MCV: 81.2 fL (ref 78.0–100.0)
PLATELETS: 285 10*3/uL (ref 150–400)
RBC: 3.41 MIL/uL — ABNORMAL LOW (ref 4.22–5.81)
RDW: 16.7 % — ABNORMAL HIGH (ref 11.5–15.5)
WBC: 7.2 10*3/uL (ref 4.0–10.5)

## 2014-06-12 LAB — PROTIME-INR
INR: 2.35 — ABNORMAL HIGH (ref 0.00–1.49)
PROTHROMBIN TIME: 26 s — AB (ref 11.6–15.2)

## 2014-06-12 LAB — GLUCOSE, CAPILLARY
Glucose-Capillary: 148 mg/dL — ABNORMAL HIGH (ref 70–99)
Glucose-Capillary: 132 mg/dL — ABNORMAL HIGH (ref 70–99)
Glucose-Capillary: 94 mg/dL (ref 70–99)
Glucose-Capillary: 112 mg/dL — ABNORMAL HIGH (ref 70–99)

## 2014-06-12 LAB — BASIC METABOLIC PANEL
Anion gap: 9 (ref 5–15)
BUN: 9 mg/dL (ref 6–23)
CO2: 24 mEq/L (ref 19–32)
Calcium: 8.5 mg/dL (ref 8.4–10.5)
Chloride: 109 mEq/L (ref 96–112)
Creatinine, Ser: 0.92 mg/dL (ref 0.50–1.35)
GFR, EST NON AFRICAN AMERICAN: 80 mL/min — AB (ref >90)
Glucose, Bld: 130 mg/dL — ABNORMAL HIGH (ref 70–99)
Potassium: 4.2 mEq/L (ref 3.7–5.3)
Sodium: 142 mEq/L (ref 137–147)

## 2014-06-12 MED ORDER — WARFARIN SODIUM 5 MG PO TABS
5.0000 mg | ORAL_TABLET | Freq: Once | ORAL | Status: AC
  Administered 2014-06-12: 5 mg via ORAL
  Filled 2014-06-12: qty 1

## 2014-06-12 NOTE — Evaluation (Signed)
Physical Therapy Evaluation Patient Details Name: Roy Hickman MRN: [id number] DOB: [birth year] Today's Date: 06/12/2014   History of Present Illness  Roy Hickman is a 73 y.o. male who has significant dementia and is unable to provide any history. Unfortunately no family is available at this time. History is obtained from the medical record. Patient was brought to the emergency room by EMS. According to the family, he was sitting in his chair at the breakfast table and began to gag and retch. He then began just staring off into space and did not respond. It was stated that he closed his eyes and would not respond. Before EMS arrival, the patient woke up and began to talk to his family again. There was no reported bowel or bladder incontinence. No jerking motions. It was felt that he would need further workup for syncope.  Clinical Impression  Pt is a 73 year old male who presents to PT for assessment of functional mobility skills.  Pt has a hx of significant dementia and no family present during PT evaluation.  Per chart and per case manager who spoke with niece, pt is assist x2 for mobility skills and pt has an aide during the day and his niece stays with him at night.  Evaluation completed with assist of NT.  Pt required assist x2 for bed mobility skills, including rolling, supine to sit, and sit to supine despite verbal cueing and tactile cueing for attempts to get pt to assist with use of bed rails or UE for balance at EOB.  Pt required assist x2 to maintain balance at EOB.  Further mobility skills withheld secondary to assist x2 for bed mobility skills, and hx of assist x2 for transfers.  Pt appears at baseline level of function, and per case manager family does not report fears of taking pt home.  No follow up PT recommended as pt is at baseline level of function, and unlikely pt will be able to participate in PT program as pt was unable to follow commands during evaluation to assist.  Pt to be  discharged from PT services.     Follow Up Recommendations No PT follow up    Equipment Recommendations  None recommended by PT       Precautions / Restrictions Precautions Precautions: Fall Precaution Comments: Severe dementia Restrictions Weight Bearing Restrictions: No      Mobility  Bed Mobility Overal bed mobility: +2 for physical assistance;Needs Assistance Bed Mobility: Rolling;Supine to Sit;Sit to Supine Rolling: Total assist;+2 for physical assistance   Supine to sit: Total assist;+2 for physical assistance Sit to supine: Total assist;+2 for physical assistance      Transfers                 General transfer comment: Transfers not assessed as pt required assist x2 for bed mobility skills and to maintain balance at EOB     Balance Overall balance assessment: Needs assistance Sitting-balance support: Feet supported;Single extremity supported Sitting balance-Leahy Scale: Zero Sitting balance - Comments: Pt required assist x2 to maintain balance at EOB, despite VC for UE assist to help with balance                                     Pertinent Vitals/Pain Pain Assessment: Faces Faces Pain Scale: No hurt    Home Living Family/patient expects to be discharged to:: Private residence Living Arrangements: Other relatives  Additional Comments: Unable to recieve hx from pt secondary to severe dementia; per chart, pt has an aide during the day and a niece stays with pt at night.      Prior Function Level of Independence: Needs assistance         Comments: Unable to recieve PLOF from pt secondary to severe dementia; per case manager who spoke with niece, pt requires assist x2 for mobility skills        Extremity/Trunk Assessment               Lower Extremity Assessment: Difficult to assess due to impaired cognition         Communication   Communication: No difficulties  Cognition Arousal/Alertness:  Awake/alert Behavior During Therapy: WFL for tasks assessed/performed Overall Cognitive Status: History of cognitive impairments - at baseline                        Assessment/Plan    PT Assessment Patent does not need any further PT services  PT Diagnosis Generalized weakness   PT Problem List    PT Treatment Interventions     PT Goals (Current goals can be found in the Care Plan section) Acute Rehab PT Goals PT Goal Formulation: All assessment and education complete, DC therapy     End of Session   Activity Tolerance: Patient tolerated treatment well Patient left: in bed;with call bell/phone within reach;with bed alarm set Nurse Communication: Mobility status    Functional Assessment Tool Used: Clinical Judgement Functional Limitation: Changing and maintaining body position Changing and Maintaining Body Position Current Status (Z6109(G8981): 100 percent impaired, limited or restricted Changing and Maintaining Body Position Goal Status (U0454(G8982): 100 percent impaired, limited or restricted Changing and Maintaining Body Position Discharge Status (U9811(G8983): 100 percent impaired, limited or restricted    Time: [id number] PT Time Calculation (min) (ACUTE ONLY): 20 min   Charges:   PT Evaluation $Initial PT Evaluation Tier I: 1 Procedure     PT G Codes:   Functional Assessment Tool Used: Clinical Judgement Functional Limitation: Changing and maintaining body position    Roy Hickman 06/12/2014, 12:18 PM

## 2014-06-12 NOTE — Plan of Care (Signed)
Problem: Consults Goal: General Medical Patient Education See Patient Education Module for specific education.  Outcome: Progressing Goal: Skin Care Protocol Initiated - if Braden Score 18 or less If consults are not indicated, leave blank or document N/A  Outcome: Not Applicable Date Met:  01/14/51 Goal: Nutrition Consult-if indicated Outcome: Not Applicable Date Met:  47/99/80 Goal: Diabetes Guidelines if Diabetic/Glucose > 140 If diabetic or lab glucose is > 140 mg/dl - Initiate Diabetes/Hyperglycemia Guidelines & Document Interventions  Outcome: Not Applicable Date Met:  07/28/91

## 2014-06-12 NOTE — Progress Notes (Signed)
UR completed 

## 2014-06-12 NOTE — Progress Notes (Signed)
  Echocardiogram 2D Echocardiogram has been performed.  Edger Husain 06/12/2014, 2:39 PM

## 2014-06-12 NOTE — Plan of Care (Signed)
Problem: Phase I Progression Outcomes Goal: Pain controlled with appropriate interventions Outcome: Progressing Goal: Initial discharge plan identified Outcome: Completed/Met Date Met:  06/12/14 Goal: Voiding-avoid urinary catheter unless indicated Outcome: Progressing Goal: Hemodynamically stable Outcome: Progressing Goal: Other Phase I Outcomes/Goals Outcome: Completed/Met Date Met:  06/12/14

## 2014-06-12 NOTE — Progress Notes (Signed)
ANTICOAGULATION CONSULT NOTE  Pharmacy Consult for Warfarin Indication: VTE prophylaxis / Hx DVT  No Known Allergies  Patient Measurements: Height: 6' (182.9 cm) Weight: 200 lb (90.719 kg) IBW/kg (Calculated) : 77.6  Vital Signs: BP: 128/61 mmHg (12/08 2229) Pulse Rate: 82 (12/08 2229)  Labs:  Recent Labs  06/10/14 1041 06/10/14 1903 06/11/14 06/11/14 0545 06/12/14 0533  HGB 9.1*  --   --  8.8* 8.8*  HCT 28.9*  --   --  27.7* 27.7*  PLT 252  --   --  271 285  LABPROT 24.5*  --   --  27.4* 26.0*  INR 2.18*  --   --  2.52* 2.35*  CREATININE 1.07  --   --  0.87 0.92  TROPONINI <0.30 <0.30 0.43* 0.36*  --     Estimated Creatinine Clearance: 76.1 mL/min (by C-G formula based on Cr of 0.92).   Medical History: Past Medical History  Diagnosis Date  . Hypertension   . Diabetes mellitus   . MI (mitral incompetence)   . Myocardial infarction 2007    s/p CABG   . Coronary artery disease   . GERD (gastroesophageal reflux disease)   . Hyperlipidemia   . Left leg DVT   . Dementia     Medications:  Prescriptions prior to admission  Medication Sig Dispense Refill Last Dose  . atorvastatin (LIPITOR) 40 MG tablet Take 40 mg by mouth at bedtime.    06/09/2014 at Unknown time  . diphenhydrAMINE (BENADRYL) 25 MG tablet Take 25 mg by mouth every 6 (six) hours as needed for itching, allergies or sleep.    UNKNOWN  . DULoxetine (CYMBALTA) 60 MG capsule Take 60 mg by mouth daily.   06/10/2014 at Unknown time  . folic acid ([name]) 1 MG tablet Take 1 mg by mouth daily.    06/10/2014 at Unknown time  . levothyroxine (SYNTHROID, LEVOTHROID) 25 MCG tablet Take 25 mcg by mouth daily before breakfast.   06/10/2014 at Unknown time  . mupirocin ointment (BACTROBAN) 2 % Apply 1 application topically 3 (three) times daily.   06/09/2014 at Unknown time  . nystatin-triamcinolone (MYCOLOG II) cream Apply 1 application topically as needed (skin irritation).    Past Week at Unknown time  .  omeprazole (PRILOSEC) 20 MG capsule Take 20 mg by mouth daily.   06/10/2014 at Unknown time  . QUEtiapine (SEROQUEL) 50 MG tablet Take 50 mg by mouth 2 (two) times daily.    06/10/2014 at Unknown time  . Tamsulosin HCl (FLOMAX) 0.4 MG CAPS Take 0.4 mg by mouth at bedtime.    06/09/2014 at Unknown time  . warfarin (COUMADIN) 5 MG tablet Take 5 mg by mouth daily.    06/09/2014 at Unknown time  . cephALEXin (KEFLEX) 500 MG capsule Take 1 capsule (500 mg total) by mouth 2 (two) times daily. (Patient not taking: Reported on 06/10/2014) 14 capsule 0 Completed Course at Unknown time   Assessment: 73 yo male admitted for syncope on chronic warfarin for hx DVT.  Home dose listed above.  INR therapeutic on admission & remains in goal range today.  No bleeding noted.   Goal of Therapy:  INR 2-3   Plan:  Continue Warfarin 5mg  Daily as at home. Daily PT/INR  Elson ClanLilliston, Jaunita Mikels Michelle 06/12/2014,8:01 AM

## 2014-06-12 NOTE — Progress Notes (Signed)
PROGRESS NOTE  Roy Hickman ZOX:[id number] DOB: [birth year] DOA: 06/10/2014 PCP: Lajean SaverJONES, ANGIE L, NT Primary Cardiologist: Nanetta BattyBerry, Jonathan MD  Summary: 73 year old man with history of significant dementia who presented via EMS with history of loss of consciousness. Per chart review, worsening of breakfast table when he began to gag, started off into space and became unresponsive. Prior to EMS arrival, the patient returned to baseline. No seizure activity was reported. He was admitted for further evaluation of syncope. Mild troponin elevation was noted and the patient was seen by cardiology in consultation.  Assessment/Plan: 1. Syncope. Suspected to be vasovagal based on history of it occurring shortly after standing, dehydration also considered. No signs of infection. Urinalysis was unremarkable, urine culture no growth, final. CT head negative. No arrhythmias on telemetry. Noted to have hypomagnesemia and hypokalemia which could have been causative and has been repleted. TSH normal. 2. Elevated troponin. Thought to be reflective of demand ischemia. No further evaluation recommended per cardiology pending echocardiogram results. 3. H/o orthostatic hypotension. Orthostatic by pulse on admission. 4. Diabetes mellitus. Stable. No hypoglycemia. Hemoglobin A1c 7.1. 5. History of coronary artery disease, MI, CABG, LLE DVT. 6. Chronic systolic congestive heart failure, LVEF 40-45 percent by echocardiogram 2013. Appears stable. Cardiology restorative choroid. 7. Long-term use of warfarin for history of DVT. 8. Chronic normocytic anemia appears to be at baseline, seen 2013 as well as 04/10/2014 (hemoglobin 9.5 at that time). 9. Dementia    Asymptomatic. Telemetry unrevealing. 2-D echocardiogram results pending.  If echocardiogram unremarkable, discharge home.  Consider further evaluation for anemia as an outpatient.  Code Status: full code DVT prophylaxis: wafarin Family Communication: discussed  with sister Nonda LouBrenda Sizemore by telephone Disposition Plan: likely home later today  Brendia Sacksaniel Adithya Difrancesco, MD  [name]  Pager [phone] If 7PM-7AM, please contact night-coverage at [email or web address], password [email or web address] 06/12/2014, 10:35 AM  LOS: 2 days   Consultants:  Cardiology   Procedures:  2-D echocardiogram pending  Antibiotics:    HPI/Subjective: No complaints. History is unreliable secondary to dementia.  Objective: Filed Vitals:   06/11/14 0104 06/11/14 0533 06/11/14 1451 06/11/14 2229  BP: 145/57 134/95 152/72 128/61  Pulse: 82 84 79 82  Temp:  97.6 F (36.4 C)    TempSrc:  Oral    Resp:  18 18 18   Height:      Weight:      SpO2: 98% 98% 98% 99%    Intake/Output Summary (Last 24 hours) at 06/12/14 1035 Last data filed at 06/12/14 0900  Gross per 24 hour  Intake   1980 ml  Output   1400 ml  Net    580 ml     Filed Weights   06/10/14 1012  Weight: 90.719 kg (200 lb)    Exam:     Afebrile, vital signs are stable. No hypoxia.  General: Appears calm, comfortable. Speech fluent and clear.  Psych: Grossly normal mood and affect.  Eyes: Pupils round, reactive.  ENT: Appears grossly unremarkable  CV: Regular rate and rhythm. No murmur, rub or gallop. No lower extremity edema.  Respiratory: Clear to auscultation bilaterally. No wheezes, rales or rhonchi. Normal respiratory effort.  Abdomen: Soft, nontender  Skin: Grossly unremarkable  Musculoskeletal: Moves bilateral upper extremities well. Moves both legs to command although weak in general.  Neuro: Bilateral lower extremity weakness consistent with history of minimal ambulation.  Data Reviewed:  Capillary blood sugars stable. No hypoglycemia.  Potassium normal, basic metabolic panel unremarkable.   Troponin trending downwards as of last  check 12/8.   Hemoglobin stable 8.8. MCV normal. CT head no acute changes.  Chest x-ray no acute abnormality.  Scheduled Meds: . aspirin EC  81 mg  Oral Daily  . atorvastatin  40 mg Oral QHS  . carvedilol  3.125 mg Oral BID WC  . Chlorhexidine Gluconate Cloth  6 each Topical Q0600  . DULoxetine  60 mg Oral Daily  . folic acid  1 mg Oral Daily  . insulin aspart  0-15 Units Subcutaneous TID WC  . insulin aspart  0-5 Units Subcutaneous QHS  . levothyroxine  25 mcg Oral QAC breakfast  . mupirocin ointment  1 application Topical TID  . mupirocin ointment  1 application Nasal BID  . pantoprazole  40 mg Oral Daily  . QUEtiapine  50 mg Oral BID  . sodium chloride  3 mL Intravenous Q12H  . tamsulosin  0.4 mg Oral QHS  . warfarin  5 mg Oral Once  . Warfarin - Pharmacist Dosing Inpatient   Does not apply Q24H   Continuous Infusions: . 0.9 % NaCl with KCl 20 mEq / L 75 mL/hr at 06/12/14 [id number]    Principal Problem:   Syncope Active Problems:   DM2 (diabetes mellitus, type 2)   HTN (hypertension)   Anemia   Long term current use of anticoagulant therapy   Dementia   Elevated troponin

## 2014-06-12 NOTE — Progress Notes (Addendum)
Consulting cardiologist: Dina RichBranch, Josafat Enrico MD Primary Cardiologist: Nanetta BattyBerry, Ozell Juhasz MD  Cardiology Specific Problem List: 1. Demand Ischemia 2. Syncope 3. CAD: Hx of CABG 4. ICM   Subjective:   No complaints.    Objective:   Pulse Rate:  [79-82] 82 (12/08 2229) Resp:  [18] 18 (12/08 2229) BP: (128-152)/(61-72) 128/61 mmHg (12/08 2229) SpO2:  [98 %-99 %] 99 % (12/08 2229) Last BM Date: 06/11/14  Filed Weights   06/10/14 1012  Weight: 200 lb (90.719 kg)    Intake/Output Summary (Last 24 hours) at 06/12/14 0757 Last data filed at 06/12/14 0700  Gross per 24 hour  Intake   1740 ml  Output   1400 ml  Net    340 ml    Telemetry: NSR with PVC's. Rates 70'-80's.   Exam:  General: No acute distress.  HEENT: Conjunctiva and lids normal, oropharynx clear.  Lungs: Clear to auscultation, nonlabored.  Cardiac: No elevated JVP or bruits. RRR, no gallop or rub.   Abdomen: Normoactive bowel sounds, nontender, nondistended.  Extremities: No pitting edema, distal pulses full.  Neuropsychiatric: Alert and mildly confused, affect appropriate.   Lab Results:  Basic Metabolic Panel:  Recent Labs Lab 06/10/14 1041 06/11/14 0545 06/12/14 0533  NA 143 143 142  K 3.6* 3.6* 4.2  CL 105 108 109  CO2 26 25 24   GLUCOSE 203* 139* 130*  BUN 14 12 9   CREATININE 1.07 0.87 0.92  CALCIUM 8.7 8.7 8.5  MG  --  1.3*  --    CBC:  Recent Labs Lab 06/10/14 1041 06/11/14 0545 06/12/14 0533  WBC 6.6 5.7 7.2  HGB 9.1* 8.8* 8.8*  HCT 28.9* 27.7* 27.7*  MCV 81.2 80.8 81.2  PLT 252 271 285    Cardiac Enzymes:  Recent Labs Lab 06/10/14 1903 06/11/14 06/11/14 0545  TROPONINI <0.30 0.43* 0.36*    Coagulation:  Recent Labs Lab 06/10/14 1041 06/11/14 0545 06/12/14 0533  INR 2.18* 2.52* 2.35*    Radiology: Ct Head Wo Contrast  06/11/2014   CLINICAL DATA:  73 year old with syncope, on Coumadin  EXAM: CT HEAD WITHOUT CONTRAST  TECHNIQUE: Contiguous axial images  were obtained from the base of the skull through the vertex without intravenous contrast.  COMPARISON:  Correlation with brain MRI from 08/19/2011  FINDINGS: There is diffuse, confluent, periventricular white matter hypodensity, compatible with chronic small vessel white matter disease.  Moderate cerebral and cerebellar is noted.  The gray-white differentiation is preserved.  There is no evidence of an acute infarct.  There is no acute intracranial hemorrhage.  There is no midline shift or mass effect.  There is no extra-axial fluid collection.  The paranasal sinuses and mastoid air cells are aerated.  The orbits are intact.  IMPRESSION: 1. No acute intracranial hemorrhage. 2. Cerebral and cerebellar atrophy. 3. Extensive chronic small vessel white matter change.   Electronically Signed   By: Fannie KneeKenneth  Crosby   On: 06/11/2014 14:53   Dg Chest Port 1 View  06/10/2014   CLINICAL DATA:  Hypotension.  EXAM: PORTABLE CHEST - 1 VIEW  COMPARISON:  08/13/2011  FINDINGS: Patient has had median sternotomy and CABG. Heart size is normal. There is mild perihilar peribronchial thickening. No focal consolidations or pleural effusions. No pulmonary edema.  IMPRESSION: 1. Postoperative changes. 2.  No evidence for acute  abnormality.   Electronically Signed   By: Rosalie GumsBeth  Brown M.D.   On: 06/10/2014 11:06     Medications:   Scheduled Medications: . aspirin  EC  81 mg Oral Daily  . atorvastatin  40 mg Oral QHS  . carvedilol  3.125 mg Oral BID WC  . Chlorhexidine Gluconate Cloth  6 each Topical Q0600  . DULoxetine  60 mg Oral Daily  . folic acid  1 mg Oral Daily  . insulin aspart  0-15 Units Subcutaneous TID WC  . insulin aspart  0-5 Units Subcutaneous QHS  . levothyroxine  25 mcg Oral QAC breakfast  . mupirocin ointment  1 application Topical TID  . mupirocin ointment  1 application Nasal BID  . pantoprazole  40 mg Oral Daily  . QUEtiapine  50 mg Oral BID  . sodium chloride  3 mL Intravenous Q12H  . tamsulosin  0.4  mg Oral QHS  . warfarin  5 mg Oral q1800  . Warfarin - Pharmacist Dosing Inpatient   Does not apply Q24H    Infusions: . 0.9 % NaCl with KCl 20 mEq / L 75 mL/hr at 06/12/14 0058     Assessment and Plan:   1. Demand Ischemia: No plans for cardiac testing at present. He is stable from cardiac standpoint. Awaiting echo results.   2. Syncopal Episode: Thought to be vaso-vagal in etiology. Would like to have him get up and ambulate. Also check orthostatics.   3. CAD: Hx of CABG: Tolerating restarting BB, continue statin, ASA.   4. Hx DVT: Continues on coumadin. CT head negative for bleed  5. Anemia: Hgb 8.8 this am. Chronic, stable.    Bettey MareKathryn M. Lawrence NP [facility]  06/12/2014, 7:57 AM   Patient seen and discussed with NP Lyman BishopLawrence, I agree with her documentation above. History suggests possible vasovagal syncope. Episode occurred after being stood up to transfer to his lift chair, followed by AMS with heavy dry retching suggesting vagal nerve activation. He was also found to be orthostatic in the ER by pulse.Endorses poor regular oral intake, mainly drinks coffee and caffeinated sodas. Encouraged to drink more water and eletrolyte rich fluids. No clear evidence of cardiac cause at this time, however we are still awaiting his echo. No significant arrhythmias by EKG or telemetry. Mild troponin elevation of 0.43 that is now trending down, no ischemia on EKG and patient has not had any chest pain. F/u echo, suspect likely demand ischemia. If echo normal no plans for further cardiac workup.   Dominga FerryJ Ezrah Dembeck MD

## 2014-06-13 DIAGNOSIS — R55 Syncope and collapse: Secondary | ICD-10-CM | POA: Diagnosis not present

## 2014-06-13 LAB — GLUCOSE, CAPILLARY
Glucose-Capillary: 112 mg/dL — ABNORMAL HIGH (ref 70–99)
Glucose-Capillary: 132 mg/dL — ABNORMAL HIGH (ref 70–99)

## 2014-06-13 LAB — PROTIME-INR
INR: 2.76 — ABNORMAL HIGH (ref 0.00–1.49)
PROTHROMBIN TIME: 29.4 s — AB (ref 11.6–15.2)

## 2014-06-13 MED ORDER — CARVEDILOL 6.25 MG PO TABS: 6.2500 mg | ORAL_TABLET | Freq: Two times a day (BID) | ORAL | Status: DC

## 2014-06-13 MED ORDER — ASPIRIN 81 MG PO TBEC: 81.0000 mg | DELAYED_RELEASE_TABLET | Freq: Every day | ORAL | Status: DC

## 2014-06-13 MED ORDER — WARFARIN SODIUM 5 MG PO TABS: 5.0000 mg | ORAL_TABLET | Freq: Once | ORAL | Status: DC

## 2014-06-13 NOTE — Progress Notes (Signed)
Discharge instruction reviewed with patient and patient's niece. IV removed. No distress noted. Hoyer lift used to place patient in wheelchair. Patient taken to lobby by nurse tech.

## 2014-06-13 NOTE — Progress Notes (Signed)
Consulting cardiologist: Dina RichBranch, Amar Keenum MD Primary Cardiologist: Nanetta BattyBerry, Dontarious Schaum   Cardiology Specific Problem List: 1.Demand Ischemia 2.Syncope 3. CAD: Hx of CABG 4. ICM  Subjective:    Pleasantly confused but no complaints.    Objective:   Temp:  [98.2 F (36.8 C)-98.8 F (37.1 C)] 98.3 F (36.8 C) (12/10 0646) Pulse Rate:  [84-87] 87 (12/10 0646) Resp:  [18-20] 20 (12/10 0646) BP: (137-157)/(71-97) 157/80 mmHg (12/10 0646) SpO2:  [98 %-100 %] 100 % (12/10 0646) Last BM Date: 06/11/14  Filed Weights   06/10/14 1012  Weight: 200 lb (90.719 kg)    Intake/Output Summary (Last 24 hours) at 06/13/14 0907 Last data filed at 06/12/14 1800  Gross per 24 hour  Intake   1440 ml  Output      0 ml  Net   1440 ml    Telemetry: NSR 80's.   Exam:  General: No acute distress.  HEENT: Conjunctiva and lids normal, oropharynx clear.  Lungs: Clear to auscultation, nonlabored.  Cardiac: No elevated JVP or bruits. RRR, no gallop or rub.   Abdomen: Normoactive bowel sounds, nontender, nondistended.  Extremities: No pitting edema, distal pulses full.  Neuropsychiatric: Alert and pleasantly confused, affect appropriate.   Lab Results:  Basic Metabolic Panel:  Recent Labs Lab 06/10/14 1041 06/11/14 0545 06/12/14 0533  NA 143 143 142  K 3.6* 3.6* 4.2  CL 105 108 109  CO2 26 25 24   GLUCOSE 203* 139* 130*  BUN 14 12 9   CREATININE 1.07 0.87 0.92  CALCIUM 8.7 8.7 8.5  MG  --  1.3*  --     CBC:  Recent Labs Lab 06/10/14 1041 06/11/14 0545 06/12/14 0533  WBC 6.6 5.7 7.2  HGB 9.1* 8.8* 8.8*  HCT 28.9* 27.7* 27.7*  MCV 81.2 80.8 81.2  PLT 252 271 285    Cardiac Enzymes:  Recent Labs Lab 06/10/14 1903 06/11/14 06/11/14 0545  TROPONINI <0.30 0.43* 0.36*   Coagulation:  Recent Labs Lab 06/11/14 0545 06/12/14 0533 06/13/14 0542  INR 2.52* 2.35* 2.76*    Radiology: Ct Head Wo Contrast  06/11/2014   CLINICAL DATA:  73 year old with  syncope, on Coumadin  EXAM: CT HEAD WITHOUT CONTRAST  TECHNIQUE: Contiguous axial images were obtained from the base of the skull through the vertex without intravenous contrast.  COMPARISON:  Correlation with brain MRI from 08/19/2011  FINDINGS: There is diffuse, confluent, periventricular white matter hypodensity, compatible with chronic small vessel white matter disease.  Moderate cerebral and cerebellar is noted.  The gray-white differentiation is preserved.  There is no evidence of an acute infarct.  There is no acute intracranial hemorrhage.  There is no midline shift or mass effect.  There is no extra-axial fluid collection.  The paranasal sinuses and mastoid air cells are aerated.  The orbits are intact.  IMPRESSION: 1. No acute intracranial hemorrhage. 2. Cerebral and cerebellar atrophy. 3. Extensive chronic small vessel white matter change.   Electronically Signed   By: Fannie KneeKenneth  Crosby   On: 06/11/2014 14:53    Echocardiogram 06/12/2014 Procedure narrative: Transthoracic echocardiography. Image quality was suboptimal. The study was technically difficult, as a result of poor sound wave transmission, restricted patient mobility, and body habitus. - Left ventricle: The cavity size was normal. Wall thickness was increased in a pattern of moderate LVH. Systolic function was low normal to mildly reduced. The estimated ejection fraction was in the range of 45% to 50%. Apical 4-chamber view suggests mild global hypokinesis. Images were  inadequate for LV wall motion assessment. Doppler parameters are consistent with abnormal left ventricular relaxation (grade 1 diastolic dysfunction). - Aortic valve: Trileaflet; mildly thickened leaflets. - Mitral valve: Mildly thickened leaflets . - Left atrium: The atrium was mildly dilated. - Right ventricle: Systolic function was mildly reduced.   Medications:   Scheduled Medications: . aspirin EC  81 mg Oral Daily  . atorvastatin  40  mg Oral QHS  . carvedilol  3.125 mg Oral BID WC  . Chlorhexidine Gluconate Cloth  6 each Topical Q0600  . DULoxetine  60 mg Oral Daily  . folic acid  1 mg Oral Daily  . insulin aspart  0-15 Units Subcutaneous TID WC  . insulin aspart  0-5 Units Subcutaneous QHS  . levothyroxine  25 mcg Oral QAC breakfast  . mupirocin ointment  1 application Topical TID  . mupirocin ointment  1 application Nasal BID  . pantoprazole  40 mg Oral Daily  . QUEtiapine  50 mg Oral BID  . sodium chloride  3 mL Intravenous Q12H  . tamsulosin  0.4 mg Oral QHS  . warfarin  5 mg Oral Once  . Warfarin - Pharmacist Dosing Inpatient   Does not apply Q24H     PRN Medications: acetaminophen **OR** acetaminophen, ondansetron **OR** ondansetron (ZOFRAN) IV   Assessment and Plan:   1.Demand ischemia: No plans for cardiac testing. Reviewed echo 45%-50% of with grade I diastolic dysfunction.  BP remains slightly elevated at rest.  HR in the 80's to 90's. Will increase carvedilol to  6.25 mg BID.   2. Syncopal Episode: Likely vaso-vagal.   3. CAD with Hx of CABG: Stable  4. Hx of DVT: Continues on coumadin.    Bettey MareKathryn M. Lawrence NP [facility]  06/13/2014, 9:07 AM   Patient seen and discussed with NP Lyman BishopLawrence, agree with her documentation above. History most consistent with vasovagal syncope, potential orthostatic component as he was orthostatic by pulse in ER. No clear cardiac etiology of syncope. No evidence of arrythmia by EKG or tele, echo with low normal to mildly decreased LVEF 45-50%. Mild troponin elevation likely demand ischemia. Agree with increase coreg to 6.25mg  bid at this time in setting of mild systolic dysfunction and elevated bp's. Encouraged increased oral intake, his main intake is sodas and caffeinated sodas. No further cardiac testing or interventions planned at this point, will sign off. He will need to f/u with NP Lawerence in 2 weeks. Defer duration of coumadin therapy for DVT to primary care  physician, if recurrent syncope may need to consider stopping in the future. Will sign off inpatient care.   Dominga FerryJ Bowden Boody MD

## 2014-06-13 NOTE — Progress Notes (Signed)
ANTICOAGULATION CONSULT NOTE  Pharmacy Consult for Warfarin Indication: VTE prophylaxis / Hx DVT  No Known Allergies  Patient Measurements: Height: 6' (182.9 cm) Weight: 200 lb (90.719 kg) IBW/kg (Calculated) : 77.6  Vital Signs: Temp: 98.3 F (36.8 C) (12/10 0646) Temp Source: Oral (12/10 0646) BP: 157/80 mmHg (12/10 0646) Pulse Rate: 87 (12/10 0646)  Labs:  Recent Labs  06/10/14 1041 06/10/14 1903 06/11/14 06/11/14 0545 06/12/14 0533 06/13/14 0542  HGB 9.1*  --   --  8.8* 8.8*  --   HCT 28.9*  --   --  27.7* 27.7*  --   PLT 252  --   --  271 285  --   LABPROT 24.5*  --   --  27.4* 26.0* 29.4*  INR 2.18*  --   --  2.52* 2.35* 2.76*  CREATININE 1.07  --   --  0.87 0.92  --   TROPONINI <0.30 <0.30 0.43* 0.36*  --   --     Estimated Creatinine Clearance: 76.1 mL/min (by C-G formula based on Cr of 0.92).   Medical History: Past Medical History  Diagnosis Date  . Hypertension   . Diabetes mellitus   . MI (mitral incompetence)   . Myocardial infarction 2007    s/p CABG   . Coronary artery disease   . GERD (gastroesophageal reflux disease)   . Hyperlipidemia   . Left leg DVT   . Dementia     Medications:  Prescriptions prior to admission  Medication Sig Dispense Refill Last Dose  . atorvastatin (LIPITOR) 40 MG tablet Take 40 mg by mouth at bedtime.    06/09/2014 at Unknown time  . diphenhydrAMINE (BENADRYL) 25 MG tablet Take 25 mg by mouth every 6 (six) hours as needed for itching, allergies or sleep.    UNKNOWN  . DULoxetine (CYMBALTA) 60 MG capsule Take 60 mg by mouth daily.   06/10/2014 at Unknown time  . folic acid ([name]) 1 MG tablet Take 1 mg by mouth daily.    06/10/2014 at Unknown time  . levothyroxine (SYNTHROID, LEVOTHROID) 25 MCG tablet Take 25 mcg by mouth daily before breakfast.   06/10/2014 at Unknown time  . mupirocin ointment (BACTROBAN) 2 % Apply 1 application topically 3 (three) times daily.   06/09/2014 at Unknown time  .  nystatin-triamcinolone (MYCOLOG II) cream Apply 1 application topically as needed (skin irritation).    Past Week at Unknown time  . omeprazole (PRILOSEC) 20 MG capsule Take 20 mg by mouth daily.   06/10/2014 at Unknown time  . QUEtiapine (SEROQUEL) 50 MG tablet Take 50 mg by mouth 2 (two) times daily.    06/10/2014 at Unknown time  . Tamsulosin HCl (FLOMAX) 0.4 MG CAPS Take 0.4 mg by mouth at bedtime.    06/09/2014 at Unknown time  . warfarin (COUMADIN) 5 MG tablet Take 5 mg by mouth daily.    06/09/2014 at Unknown time  . cephALEXin (KEFLEX) 500 MG capsule Take 1 capsule (500 mg total) by mouth 2 (two) times daily. (Patient not taking: Reported on 06/10/2014) 14 capsule 0 Completed Course at Unknown time   Assessment: 73 yo male admitted for syncope on chronic warfarin for hx DVT.  Home dose listed above.  INR therapeutic on admission & remains in goal range today.  No bleeding noted.   Goal of Therapy:  INR 2-3   Plan:  Continue Warfarin 5mg  daily as at home. Daily PT/INR  Elson ClanLilliston, Winry Egnew Michelle 06/13/2014,7:55 AM

## 2014-06-13 NOTE — Discharge Summary (Signed)
Physician Discharge Summary  Roy Hickman UJW:[id number] DOB: June 14, 1941 DOA: 06/10/2014  PCP: Lajean SaverJONES, ANGIE L, NT  Admit date: 06/10/2014 Discharge date: 06/13/2014  Recommendations for Outpatient Follow-up:  1. Follow-up with cardiology has been arranged. Follow-up borderline low ejection fraction with global hypokinesis. 2. Consider risk/benefit of warfarin if has recurrent syncope.  3. Consider further evaluation of anemia as an outpatient   Follow-up Information    Follow up with JONES, ANGIE L, NT. Schedule an appointment as soon as possible for a visit in 1 week.   Specialty:  Cardiology   Contact information:   [place] [id number]       Follow up with Joni ReiningKathryn Lawrence, NP On 07/01/2014.   Specialty:  Nurse Practitioner   Why:  2:30 PM   Contact information:   [address] [place] [id number] [phone]      Discharge Diagnoses:  1. Syncope, likely vasovagal 2. Elevated troponin, demand ischemia 3. Diabetes mellitus 4. History of DVT 5. Chronic normocytic anemia  Discharge Condition: Improved  Disposition: Home  Diet recommendation: Heart healthy diabetic diet   Filed Weights   06/10/14 1012  Weight: 90.719 kg (200 lb)    History of present illness:  73 year old man with history of significant dementia who presented via EMS with history of loss of consciousness. Per chart review, stood up from breakfast table when he began to gag, started off into space and became unresponsive. Prior to EMS arrival, the patient returned to baseline. No seizure activity was reported. He was admitted for further evaluation of syncope.  Hospital Course:  No recurrent syncope. Further history suggested vasovagal syncope. serial troponin unremarkable, telemetry unrevealing. Echocardiogram revealed mild systolic dysfunction with global hypokinesis. Cardiology added Coreg, recommend no further inpatient testing.  1. Syncope. Likely vasovagal based on history of it occurring  shortly after standing, dehydration also considered. No signs of infection. Urinalysis was unremarkable, urine culture no growth, final. CT head negative. No arrhythmias on telemetry. Noted to have hypomagnesemia and hypokalemia which could have been causative and has been repleted. TSH normal. 2. Elevated troponin. Thought to be reflective of demand ischemia. No further evaluation recommended per cardiology. 3. H/o orthostatic hypotension. Orthostatic by pulse on admission. 4. Diabetes mellitus. Remains stable.. Hemoglobin A1c 7.1. 5. History of coronary artery disease, MI, CABG, LLE DVT. 6. Chronic systolic congestive heart failure, LVEF 40-45 percent by echocardiogram. Appears stable. Cardiology increased carvedilol. No further testing recommended. 7. Long-term use of warfarin for history of DVT. INR therapeutic. 8. Chronic normocytic anemia appears to be at baseline, seen 2013 as well as 04/10/2014 (hemoglobin 9.5 at that time). 9. Dementia  Consultants:  Cardiology  Procedures:  2-D echocardiogram Study Conclusions  - Procedure narrative: Transthoracic echocardiography. Image quality was suboptimal. The study was technically difficult, as a result of poor sound wave transmission, restricted patient mobility, and body habitus. - Left ventricle: The cavity size was normal. Wall thickness was increased in a pattern of moderate LVH. Systolic function was low normal to mildly reduced. The estimated ejection fraction was in the range of 45% to 50%. Apical 4-chamber view suggests mild global hypokinesis. Images were inadequate for LV wall motion assessment. Doppler parameters are consistent with abnormal left ventricular relaxation (grade 1 diastolic dysfunction). - Aortic valve: Trileaflet; mildly thickened leaflets. - Mitral valve: Mildly thickened leaflets . - Left atrium: The atrium was mildly dilated. - Right ventricle: Systolic function was mildly  reduced.  Discharge Instructions  Discharge Instructions    Diet -  low sodium heart healthy    Complete by:  As directed      Diet Carb Modified    Complete by:  As directed      Discharge instructions    Complete by:  As directed   Call your physician or seek immediate medical attention for passing out, pain, shortness of breath or worsening of condition.     Increase activity slowly    Complete by:  As directed           Current Discharge Medication List    START taking these medications   Details  aspirin EC 81 MG EC tablet Take 1 tablet (81 mg total) by mouth daily.    carvedilol (COREG) 6.25 MG tablet Take 1 tablet (6.25 mg total) by mouth 2 (two) times daily with a meal. Qty: 60 tablet, Refills: 0      CONTINUE these medications which have NOT CHANGED   Details  atorvastatin (LIPITOR) 40 MG tablet Take 40 mg by mouth at bedtime.     diphenhydrAMINE (BENADRYL) 25 MG tablet Take 25 mg by mouth every 6 (six) hours as needed for itching, allergies or sleep.     DULoxetine (CYMBALTA) 60 MG capsule Take 60 mg by mouth daily.    folic acid ([name]) 1 MG tablet Take 1 mg by mouth daily.     levothyroxine (SYNTHROID, LEVOTHROID) 25 MCG tablet Take 25 mcg by mouth daily before breakfast.    mupirocin ointment (BACTROBAN) 2 % Apply 1 application topically 3 (three) times daily.    nystatin-triamcinolone (MYCOLOG II) cream Apply 1 application topically as needed (skin irritation).     omeprazole (PRILOSEC) 20 MG capsule Take 20 mg by mouth daily.    QUEtiapine (SEROQUEL) 50 MG tablet Take 50 mg by mouth 2 (two) times daily.     Tamsulosin HCl (FLOMAX) 0.4 MG CAPS Take 0.4 mg by mouth at bedtime.     warfarin (COUMADIN) 5 MG tablet Take 5 mg by mouth daily.       STOP taking these medications     cephALEXin (KEFLEX) 500 MG capsule        No Known Allergies  The results of significant diagnostics from this hospitalization (including imaging, microbiology,  ancillary and laboratory) are listed below for reference.    Significant Diagnostic Studies: Ct Head Wo Contrast  06/11/2014   CLINICAL DATA:  73 year old with syncope, on Coumadin  EXAM: CT HEAD WITHOUT CONTRAST  TECHNIQUE: Contiguous axial images were obtained from the base of the skull through the vertex without intravenous contrast.  COMPARISON:  Correlation with brain MRI from 08/19/2011  FINDINGS: There is diffuse, confluent, periventricular white matter hypodensity, compatible with chronic small vessel white matter disease.  Moderate cerebral and cerebellar is noted.  The gray-white differentiation is preserved.  There is no evidence of an acute infarct.  There is no acute intracranial hemorrhage.  There is no midline shift or mass effect.  There is no extra-axial fluid collection.  The paranasal sinuses and mastoid air cells are aerated.  The orbits are intact.  IMPRESSION: 1. No acute intracranial hemorrhage. 2. Cerebral and cerebellar atrophy. 3. Extensive chronic small vessel white matter change.   Electronically Signed   By: Fannie Knee   On: 06/11/2014 14:53   Dg Chest Port 1 View  06/10/2014   CLINICAL DATA:  Hypotension.  EXAM: PORTABLE CHEST - 1 VIEW  COMPARISON:  08/13/2011  FINDINGS: Patient has had median sternotomy and CABG. Heart size is  normal. There is mild perihilar peribronchial thickening. No focal consolidations or pleural effusions. No pulmonary edema.  IMPRESSION: 1. Postoperative changes. 2.  No evidence for acute  abnormality.   Electronically Signed   By: Rosalie GumsBeth  Brown M.D.   On: 06/10/2014 11:06    Microbiology: Recent Results (from the past 240 hour(s))  Urine culture     Status: None   Collection Time: 06/10/14 12:27 PM  Result Value Ref Range Status   Specimen Description URINE, CATHETERIZED  Final   Special Requests NONE  Final   Culture  Setup Time   Final    06/10/2014 22:08 Performed at [facility]    Colony Count NO GROWTH Performed at [facility]   Final   Culture NO GROWTH Performed at [facility]   Final   Report Status 06/11/2014 FINAL  Final  MRSA PCR Screening     Status: Abnormal   Collection Time: 06/10/14  3:50 PM  Result Value Ref Range Status   MRSA by PCR POSITIVE (A) NEGATIVE Final    Comment:        The GeneXpert MRSA Assay (FDA approved for NASAL specimens only), is one component of a comprehensive MRSA colonization surveillance program. It is not intended to diagnose MRSA infection nor to guide or monitor treatment for MRSA infections. RESULT CALLED TO, READ BACK BY AND VERIFIED WITH: FIRNK L AT 2028 ON [id number] BY FORSYTH K      Labs: Basic Metabolic Panel:  Recent Labs Lab 06/10/14 1041 06/11/14 0545 06/12/14 0533  NA 143 143 142  K 3.6* 3.6* 4.2  CL 105 108 109  CO2 26 25 24   GLUCOSE 203* 139* 130*  BUN 14 12 9   CREATININE 1.07 0.87 0.92  CALCIUM 8.7 8.7 8.5  MG  --  1.3*  --    CBC:  Recent Labs Lab 06/10/14 1041 06/11/14 0545 06/12/14 0533  WBC 6.6 5.7 7.2  NEUTROABS 5.5  --   --   HGB 9.1* 8.8* 8.8*  HCT 28.9* 27.7* 27.7*  MCV 81.2 80.8 81.2  PLT 252 271 285   Cardiac Enzymes:  Recent Labs Lab 06/10/14 1041 06/10/14 1903 06/11/14 06/11/14 0545  TROPONINI <0.30 <0.30 0.43* 0.36*   CBG:  Recent Labs Lab 06/12/14 1156 06/12/14 1649 06/12/14 2104 06/13/14 0706 06/13/14 1106  GLUCAP 132* 94 112* 112* 132*    Principal Problem:   Syncope Active Problems:   DM2 (diabetes mellitus, type 2)   HTN (hypertension)   Anemia   Long term current use of anticoagulant therapy   Dementia   Elevated troponin   Chronic anticoagulation   Time coordinating disch35 minutes  Signed:  Brendia Sacksaniel Amil Bouwman, MD [name] 06/13/2014, 1:07 PM

## 2014-06-13 NOTE — Progress Notes (Signed)
PROGRESS NOTE  Roy Hickman H Thurston ZOX:[id number] DOB: 23-Feb-1941 DOA: 06/10/2014 PCP: Lajean SaverJONES, ANGIE L, NT Primary Cardiologist: Nanetta BattyBerry, Jonathan MD  Summary: 73 year old man with history of significant dementia who presented via EMS with history of loss of consciousness. Per chart review, stood up from breakfast table when he began to gag, started off into space and became unresponsive. Prior to EMS arrival, the patient returned to baseline. No seizure activity was reported. He was admitted for further evaluation of syncope. Mild troponin elevation was noted and the patient was seen by cardiology in consultation. Troponin subsequently decreased. Cardiology impression was demand ischemia, no plans for cardiac testing. Echocardiogram revealed mild diastolic dysfunction and mildly depressed LVEF. Coreg was increased.  Assessment/Plan: 1. Syncope. Likely vasovagal based on history of it occurring shortly after standing, dehydration also considered. No signs of infection. Urinalysis was unremarkable, urine culture no growth, final. CT head negative. No arrhythmias on telemetry. Noted to have hypomagnesemia and hypokalemia which could have been causative and has been repleted. TSH normal. 2. Elevated troponin. Thought to be reflective of demand ischemia. No further evaluation recommended per cardiology. 3. H/o orthostatic hypotension. Orthostatic by pulse on admission. 4. Diabetes mellitus. Remains stable.. Hemoglobin A1c 7.1. 5. History of coronary artery disease, MI, CABG, LLE DVT. 6. Chronic systolic congestive heart failure, LVEF 40-45 percent by echocardiogram 2013. Appears stable. Cardiology increased carvedilol. No further testing recommended. 7. Long-term use of warfarin for history of DVT. INR therapeutic. 8. Chronic normocytic anemia appears to be at baseline, seen 2013 as well as 04/10/2014 (hemoglobin 9.5 at that time). 9. Dementia    Coreg increased per cardiology. F/u with Ms. Lawrence in 2  weeks.  Consider further evaluation for anemia as an outpatient.  Code Status: full code DVT prophylaxis: wafarin Family Communication: discussed with sister Nonda LouBrenda Sizemore by telephone Disposition Plan: likely home later today  Brendia Sacksaniel Goodrich, MD  [name]  Pager [phone] If 7PM-7AM, please contact night-coverage at [email or web address], password [facility] 06/13/2014, 12:48 PM  LOS: 3 days   Consultants:  Cardiology   Procedures:  2-D echocardiogram Study Conclusions  - Procedure narrative: Transthoracic echocardiography. Image quality was suboptimal. The study was technically difficult, as a result of poor sound wave transmission, restricted patient mobility, and body habitus. - Left ventricle: The cavity size was normal. Wall thickness was increased in a pattern of moderate LVH. Systolic function was low normal to mildly reduced. The estimated ejection fraction was in the range of 45% to 50%. Apical 4-chamber view suggests mild global hypokinesis. Images were inadequate for LV wall motion assessment. Doppler parameters are consistent with abnormal left ventricular relaxation (grade 1 diastolic dysfunction). - Aortic valve: Trileaflet; mildly thickened leaflets. - Mitral valve: Mildly thickened leaflets . - Left atrium: The atrium was mildly dilated. - Right ventricle: Systolic function was mildly reduced.  Antibiotics:    HPI/Subjective: No complaints. No pain.  Objective: Filed Vitals:   06/12/14 1129 06/12/14 1517 06/12/14 2155 06/13/14 0646  BP: 146/72 144/71 137/97 157/80  Pulse: 85 86 84 87  Temp:  98.2 F (36.8 C) 98.8 F (37.1 C) 98.3 F (36.8 C)  TempSrc:  Oral Oral Oral  Resp: 18 18 20 20   Height:      Weight:      SpO2:  99% 98% 100%    Intake/Output Summary (Last 24 hours) at 06/13/14 1248 Last data filed at 06/13/14 0900  Gross per 24 hour  Intake   1680 ml  Output      0  ml  Net   1680 ml     Filed Weights    06/10/14 1012  Weight: 90.719 kg (200 lb)    Exam:     Afebrile, vital signs stable. No hypoxia.  Appears calm, comfortable. Speech fluent and clear. Ccardiovascular regular rate and rhythm. No murmur, rub or gallop. Telemetry sinus rhythm.  Respiratory clear to auscultation bilaterally. No wheezes, rales or rhonchi. Normal respiratory effort.  Abdomen soft.  Most discussed. Excellent tone and strength all extremities.  Data Reviewed:  Capillary blood sugar stable. INR 2.76.  Scheduled Meds: . aspirin EC  81 mg Oral Daily  . atorvastatin  40 mg Oral QHS  . carvedilol  3.125 mg Oral BID WC  . Chlorhexidine Gluconate Cloth  6 each Topical Q0600  . DULoxetine  60 mg Oral Daily  . folic acid  1 mg Oral Daily  . insulin aspart  0-15 Units Subcutaneous TID WC  . insulin aspart  0-5 Units Subcutaneous QHS  . levothyroxine  25 mcg Oral QAC breakfast  . mupirocin ointment  1 application Topical TID  . mupirocin ointment  1 application Nasal BID  . pantoprazole  40 mg Oral Daily  . QUEtiapine  50 mg Oral BID  . sodium chloride  3 mL Intravenous Q12H  . tamsulosin  0.4 mg Oral QHS  . warfarin  5 mg Oral Once  . Warfarin - Pharmacist Dosing Inpatient   Does not apply Q24H   Continuous Infusions:    Principal Problem:   Syncope Active Problems:   DM2 (diabetes mellitus, type 2)   HTN (hypertension)   Anemia   Long term current use of anticoagulant therapy   Dementia   Elevated troponin   Chronic anticoagulation

## 2014-07-01 ENCOUNTER — Encounter: Payer: Self-pay | Admitting: Adult Health

## 2014-07-01 ENCOUNTER — Ambulatory Visit (INDEPENDENT_AMBULATORY_CARE_PROVIDER_SITE_OTHER): Payer: Medicare Other | Admitting: Adult Health

## 2014-07-01 ENCOUNTER — Ambulatory Visit (INDEPENDENT_AMBULATORY_CARE_PROVIDER_SITE_OTHER): Payer: Medicare Other | Admitting: Pharmacist Clinician (PhC)/ Clinical Pharmacy Specialist

## 2014-07-01 VITALS — BP 112/78 | HR 88 | Wt 173.4 lb

## 2014-07-01 DIAGNOSIS — R791 Abnormal coagulation profile: Secondary | ICD-10-CM

## 2014-07-01 DIAGNOSIS — I82409 Acute embolism and thrombosis of unspecified deep veins of unspecified lower extremity: Secondary | ICD-10-CM

## 2014-07-01 DIAGNOSIS — Z7901 Long term (current) use of anticoagulants: Secondary | ICD-10-CM

## 2014-07-01 LAB — POCT INR: INR: 5.4

## 2014-07-01 MED ORDER — TAMSULOSIN HCL 0.4 MG PO CAPS: ORAL_CAPSULE | ORAL | Status: DC

## 2014-07-01 NOTE — Assessment & Plan Note (Signed)
INR is elevated at 5.4. I have recommended that he stop the coumadin today and tomorrow. He is followed by Dr, Hazle CocaBerry's office for dosing.

## 2014-07-01 NOTE — Patient Instructions (Signed)
Your physician recommends that you schedule a follow-up appointment in: 1 month  Your physician has recommended you make the following change in your medication:   HOLD COUMADIN UNTIL THE NURSE CONTACTS YOU WITH INSTRUCTIONS  DECREASE FLOMAX TO 0.2 MG DAILY  Your physician recommends that you return for lab work CBC/BMP  Thank you for choosing [facility]!!

## 2014-07-01 NOTE — Progress Notes (Signed)
HPI: is a 73 year old patient of Dr. 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 The patient was seen on consultation in December 2050 in the setting of elevated troponin and questionable syncopal episode. The patient had a nuclear medicine stress test in January 2013, which demonstrated inferior lateral scar without ischemia. EF of 40-45%.  He was diagnosed with demand ischemia , vasovagal syncope, and chronic normocytic anemia. It was also felt that he was mildly dehydrated, hypomagnesemic and hypokalemic. Echocardiogram was repeated, revealing an EF of 45-50% with mild global hypokinesis and grade 1 diastolic dysfunction. He was started on carvedilol 6.25 mg twice a day, aspirin 81 mg daily, continued on Coumadin and other medications prior to admission. [place] of [place].    He is followed at home with home INR meter. Today INR was 5.4. He is also having issues with hypotension and significant deconditioning. Family members state he was very hypotensive in PCP office and coreg and ASA was discontinued. He has dementia but does not have any complaints. Does not remember passing out at PCP office.   No Known Allergies  Current Outpatient Prescriptions  Medication Sig Dispense Refill  . acetaminophen-codeine (TYLENOL #3) 300-30 MG per tablet   0  . atorvastatin (LIPITOR) 40 MG tablet Take 40 mg by mouth at bedtime.     . diphenhydrAMINE (BENADRYL) 25 MG tablet Take 25 mg by mouth every 6 (six) hours as needed for itching, allergies or sleep.     . DULoxetine (CYMBALTA) 60 MG capsule Take 60 mg by mouth daily.    . folic acid ([name]) 1 MG tablet Take 1 mg by mouth daily.     Marland Kitchen. levothyroxine (SYNTHROID, LEVOTHROID) 25 MCG tablet Take  25 mcg by mouth daily before breakfast.    . mupirocin ointment (BACTROBAN) 2 % Apply 1 application topically 3 (three) times daily.    Marland Kitchen. nystatin-triamcinolone (MYCOLOG II) cream Apply 1 application topically as needed (skin irritation).     Marland Kitchen. omeprazole (PRILOSEC) 20 MG capsule Take 20 mg by mouth daily.    . QUEtiapine (SEROQUEL) 50 MG tablet Take 50 mg by mouth 2 (two) times daily.     . Tamsulosin HCl (FLOMAX) 0.4 MG CAPS Take 0.4 mg by mouth at bedtime.     Marland Kitchen. warfarin (COUMADIN) 5 MG tablet Take 5 mg by mouth daily.      No current facility-administered medications for this visit.    Past Medical History  Diagnosis Date  . Hypertension   . Diabetes mellitus   . MI (mitral incompetence)   . Myocardial infarction 2007    s/p CABG   . Coronary artery disease   . GERD (gastroesophageal reflux disease)   . Hyperlipidemia   . Left leg DVT   . Dementia     Past Surgical History  Procedure Laterality Date  . Elbow surgery      left  . Laminectomy    . Cervical spine surgery    . Coronary artery bypass graft  2007    LIMA to his LAD, and vein to a diagonal branch and PDA. His EF at that time was 35% to 40%.   . Back surgery    . Posterior cervical fusion/foraminotomy  07/21/2011    Procedure: POSTERIOR CERVICAL  FUSION/FORAMINOTOMY LEVEL 5;  Surgeon: Karn CassisErnesto M Botero, MD;  Location: [facility];  Service: Neurosurgery;  Laterality: N/A;  Cervical three to Thoracic one Laminectomy, posterolateral arthrodesis, with Lateral Mass screws    ROS: Complete review of systems performed and found to be negative unless outlined above  PHYSICAL EXAM BP 112/78 mmHg  Pulse 88  Wt 173 lb 6.4 oz (78.654 kg) General: Well developed, thin,  nourished, in no acute distress. Pale.sitting in a wheel chair.  Head: Eyes PERRLA, No xanthomas.   Normal cephalic and atramatic  Lungs: Some crackles in the bases. Heart: HRRR S1 S2, without MRG.  Pulses are 2+ & equal.            No carotid bruit. No  JVD.  No abdominal bruits. No femoral bruits. Abdomen: Bowel sounds are positive, abdomen soft and non-tender without masses or                  Hernia's noted. Msk:  Back normal, normal gait. Significantly diminished strength and tone for age. Extremities: No clubbing, cyanosis or edema.  DP +1 Neuro: Alert and oriented X 3. Psych: Flat affect, responds appropriately  ASSESSMENT AND PLAN

## 2014-07-01 NOTE — Assessment & Plan Note (Addendum)
Denies chest pain or dyspnea. He is very sedentary and uses wheel chair for ambulation. I will continue current medications.

## 2014-07-01 NOTE — Progress Notes (Deleted)
Name: Roy Hickman    DOB: February 05, 1941  Age: 73 y.o.  MR#: [id number]       PCP:  Lajean SaverJONES, ANGIE L, NT      Insurance: Payor: [employer] [place] [employer] / Plan: BLUE MEDICARE / Product Type: *No Product type* /   CC:    Chief Complaint  Patient presents with  . Loss of Consciousness  . DVT    VS Filed Vitals:   07/01/14 1423  BP: 112/78  Pulse: 88  Weight: 173 lb 6.4 oz (78.654 kg)    Weights Current Weight  07/01/14 173 lb 6.4 oz (78.654 kg)  06/10/14 200 lb (90.719 kg)  10/05/13 202 lb 11.2 oz (91.944 kg)    Blood Pressure  BP Readings from Last 3 Encounters:  07/01/14 112/78  06/13/14 157/80  04/10/14 136/66     Admit date:  (Not on file) Last encounter with RMR:  Visit date not found   Allergy Review of patient's allergies indicates no known allergies.  Current Outpatient Prescriptions  Medication Sig Dispense Refill  . acetaminophen-codeine (TYLENOL #3) 300-30 MG per tablet   0  . atorvastatin (LIPITOR) 40 MG tablet Take 40 mg by mouth at bedtime.     . diphenhydrAMINE (BENADRYL) 25 MG tablet Take 25 mg by mouth every 6 (six) hours as needed for itching, allergies or sleep.     . DULoxetine (CYMBALTA) 60 MG capsule Take 60 mg by mouth daily.    . folic acid ([name]) 1 MG tablet Take 1 mg by mouth daily.     Marland Kitchen. levothyroxine (SYNTHROID, LEVOTHROID) 25 MCG tablet Take 25 mcg by mouth daily before breakfast.    . mupirocin ointment (BACTROBAN) 2 % Apply 1 application topically 3 (three) times daily.    Marland Kitchen. nystatin-triamcinolone (MYCOLOG II) cream Apply 1 application topically as needed (skin irritation).     Marland Kitchen. omeprazole (PRILOSEC) 20 MG capsule Take 20 mg by mouth daily.    . QUEtiapine (SEROQUEL) 50 MG tablet Take 50 mg by mouth 2 (two) times daily.     . Tamsulosin HCl (FLOMAX) 0.4 MG CAPS Take 0.4 mg by mouth at bedtime.     Marland Kitchen. warfarin (COUMADIN) 5 MG tablet Take 5 mg by mouth daily.      No current facility-administered medications for this visit.     Discontinued Meds:    Medications Discontinued During This Encounter  Medication Reason  . aspirin EC 81 MG EC tablet Error  . carvedilol (COREG) 6.25 MG tablet Error    Patient Active Problem List   Diagnosis Date Noted  . Chronic anticoagulation   . Elevated troponin 06/11/2014  . Faintness   . Syncope 06/10/2014  . Dementia 06/10/2014  . Dyslipidemia 02/19/2013  . Long term current use of anticoagulant therapy 09/22/2012  . DVT of leg (deep venous thrombosis), unspecified laterality 09/22/2012  . Encephalopathy 08/20/2011  . Urinary retention 08/20/2011  . Urosepsis 08/13/2011  . Acute renal failure 08/13/2011  . DVT of leg (deep venous thrombosis) 08/13/2011  . Anemia 08/13/2011  . Difficulty walking 05/11/2011  . 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 Myoview low risk 1/13 05/11/2011  . DM2 (diabetes mellitus, type 2) 05/11/2011  . HTN (hypertension) 05/11/2011  . Low back pain 05/11/2011    LABS    Component Value Date/Time   NA 142 06/12/2014 0533   NA 143 06/11/2014 0545   NA 143 06/10/2014 1041   K 4.2 06/12/2014 0533   K 3.6*  06/11/2014 0545   K 3.6* 06/10/2014 1041   CL 109 06/12/2014 0533   CL 108 06/11/2014 0545   CL 105 06/10/2014 1041   CO2 24 06/12/2014 0533   CO2 25 06/11/2014 0545   CO2 26 06/10/2014 1041   GLUCOSE 130* 06/12/2014 0533   GLUCOSE 139* 06/11/2014 0545   GLUCOSE 203* 06/10/2014 1041   BUN 9 06/12/2014 0533   BUN 12 06/11/2014 0545   BUN 14 06/10/2014 1041   CREATININE 0.92 06/12/2014 0533   CREATININE 0.87 06/11/2014 0545   CREATININE 1.07 06/10/2014 1041   CALCIUM 8.5 06/12/2014 0533   CALCIUM 8.7 06/11/2014 0545   CALCIUM 8.7 06/10/2014 1041   GFRNONAA 80* 06/12/2014 0533   GFRNONAA 82* 06/11/2014 0545   GFRNONAA 66* 06/10/2014 1041   GFRAA >90 06/12/2014 0533   GFRAA >90 06/11/2014 0545   GFRAA 76* 06/10/2014 1041   CMP     Component Value Date/Time   NA 142 06/12/2014 0533   K 4.2 06/12/2014 0533   CL 109 06/12/2014 0533    CO2 24 06/12/2014 0533   GLUCOSE 130* 06/12/2014 0533   BUN 9 06/12/2014 0533   CREATININE 0.92 06/12/2014 0533   CALCIUM 8.5 06/12/2014 0533   PROT 6.3 08/14/2011 0330   ALBUMIN 2.5* 08/14/2011 0330   AST 15 08/14/2011 0330   ALT 26 08/14/2011 0330   ALKPHOS 84 08/14/2011 0330   BILITOT 0.3 08/14/2011 0330   GFRNONAA 80* 06/12/2014 0533   GFRAA >90 06/12/2014 0533       Component Value Date/Time   WBC 7.2 06/12/2014 0533   WBC 5.7 06/11/2014 0545   WBC 6.6 06/10/2014 1041   HGB 8.8* 06/12/2014 0533   HGB 8.8* 06/11/2014 0545   HGB 9.1* 06/10/2014 1041   HCT 27.7* 06/12/2014 0533   HCT 27.7* 06/11/2014 0545   HCT 28.9* 06/10/2014 1041   MCV 81.2 06/12/2014 0533   MCV 80.8 06/11/2014 0545   MCV 81.2 06/10/2014 1041    Lipid Panel  No results found for: CHOL, TRIG, HDL, CHOLHDL, VLDL, LDLCALC, LDLDIRECT  ABG    Component Value Date/Time   TCO2 26 05/11/2011 0150     Lab Results  Component Value Date   TSH 3.850 06/10/2014   BNP (last 3 results) No results for input(s): PROBNP in the last 8760 hours. Cardiac Panel (last 3 results) No results for input(s): CKTOTAL, CKMB, TROPONINI, RELINDX in the last 72 hours.  Iron/TIBC/Ferritin/ %Sat    Component Value Date/Time   IRON 63 08/14/2011 0330   TIBC 183* 08/14/2011 0330   FERRITIN 488* 08/14/2011 0330   IRONPCTSAT 34 08/14/2011 0330     EKG Orders placed or performed during the hospital encounter of 06/10/14  . ED EKG  . ED EKG  . EKG 12-Lead  . EKG 12-Lead  . EKG 12-Lead  . EKG 12-Lead  . EKG     Prior Assessment and Plan Problem List as of 07/01/2014    Difficulty walking   CAD, CABG X 3 11/07. Myoview low risk 1/13   Last Assessment & Plan   10/05/2013 Office Visit Written 10/05/2013  2:50 PM by Runell GessJonathan J Berry, MD    Status post coronary artery bypass grafting November 2007 the LIMA to his LAD, vein to diagonal branch and PDA. The ejection fraction at that time was 35-40%. Subsequent determination  of his EF has been in the 40-45% range. He had a negative Myoview stress test 07/09/11. He denies chest pain or  shortness of breath.    DM2 (diabetes mellitus, type 2)   Last Assessment & Plan   02/19/2013 Office Visit Written 02/19/2013  3:33 PM by Abelino Derrick, PA-C    .    HTN (hypertension)   Last Assessment & Plan   10/05/2013 Office Visit Written 10/05/2013  2:52 PM by Runell Gess, MD    Under good control of her medications    Low back pain   Urosepsis   Acute renal failure   DVT of leg (deep venous thrombosis)   Anemia   Encephalopathy   Urinary retention   Long term current use of anticoagulant therapy   DVT of leg (deep venous thrombosis), unspecified laterality   Last Assessment & Plan   10/05/2013 Office Visit Written 10/05/2013  2:51 PM by Runell Gess, MD    He has a known left lower extremity DVT involving the common femoral femoral and profunda femoral popliteal and saphenous veins. He has been on chronic Coumadin anticoagulation.    Dyslipidemia   Last Assessment & Plan   10/05/2013 Office Visit Written 10/05/2013  2:59 PM by Runell Gess, MD    On statin therapy followed by his PCP    Syncope   Dementia   Elevated troponin   Faintness   Chronic anticoagulation       Imaging: Ct Head Wo Contrast  06/11/2014   CLINICAL DATA:  73 year old with syncope, on Coumadin  EXAM: CT HEAD WITHOUT CONTRAST  TECHNIQUE: Contiguous axial images were obtained from the base of the skull through the vertex without intravenous contrast.  COMPARISON:  Correlation with brain MRI from 08/19/2011  FINDINGS: There is diffuse, confluent, periventricular white matter hypodensity, compatible with chronic small vessel white matter disease.  Moderate cerebral and cerebellar is noted.  The gray-white differentiation is preserved.  There is no evidence of an acute infarct.  There is no acute intracranial hemorrhage.  There is no midline shift or mass effect.  There is no extra-axial fluid  collection.  The paranasal sinuses and mastoid air cells are aerated.  The orbits are intact.  IMPRESSION: 1. No acute intracranial hemorrhage. 2. Cerebral and cerebellar atrophy. 3. Extensive chronic small vessel white matter change.   Electronically Signed   By: Fannie Knee   On: 06/11/2014 14:53   Dg Chest Port 1 View  06/10/2014   CLINICAL DATA:  Hypotension.  EXAM: PORTABLE CHEST - 1 VIEW  COMPARISON:  08/13/2011  FINDINGS: Patient has had median sternotomy and CABG. Heart size is normal. There is mild perihilar peribronchial thickening. No focal consolidations or pleural effusions. No pulmonary edema.  IMPRESSION: 1. Postoperative changes. 2.  No evidence for acute  abnormality.   Electronically Signed   By: Rosalie Gums M.D.   On: 06/10/2014 11:06

## 2014-07-02 ENCOUNTER — Telehealth: Payer: Self-pay | Admitting: *Deleted

## 2014-07-02 LAB — CBC WITH DIFFERENTIAL/PLATELET
Basophils Absolute: 0 10*3/uL (ref 0.0–0.1)
Basophils Relative: 0 % (ref 0–1)
EOS PCT: 4 % (ref 0–5)
Eosinophils Absolute: 0.3 10*3/uL (ref 0.0–0.7)
HEMATOCRIT: 31.2 % — AB (ref 39.0–52.0)
Hemoglobin: 9.6 g/dL — ABNORMAL LOW (ref 13.0–17.0)
LYMPHS ABS: 1.2 10*3/uL (ref 0.7–4.0)
Lymphocytes Relative: 18 % (ref 12–46)
MCH: 24.9 pg — AB (ref 26.0–34.0)
MCHC: 30.8 g/dL (ref 30.0–36.0)
MCV: 80.8 fL (ref 78.0–100.0)
MONOS PCT: 8 % (ref 3–12)
Monocytes Absolute: 0.5 10*3/uL (ref 0.1–1.0)
Neutro Abs: 4.8 10*3/uL (ref 1.7–7.7)
Neutrophils Relative %: 70 % (ref 43–77)
Platelets: 353 10*3/uL (ref 150–400)
RBC: 3.86 MIL/uL — ABNORMAL LOW (ref 4.22–5.81)
RDW: 17 % — ABNORMAL HIGH (ref 11.5–15.5)
WBC: 6.8 10*3/uL (ref 4.0–10.5)

## 2014-07-02 LAB — BASIC METABOLIC PANEL WITH GFR
BUN: 19 mg/dL (ref 6–23)
CO2: 26 mEq/L (ref 19–32)
Calcium: 8.9 mg/dL (ref 8.4–10.5)
Chloride: 107 mEq/L (ref 96–112)
Creat: 1.04 mg/dL (ref 0.50–1.35)
GFR, EST NON AFRICAN AMERICAN: 68 mL/min
GFR, Est African American: 79 mL/min
GLUCOSE: 154 mg/dL — AB (ref 70–99)
POTASSIUM: 3.6 meq/L (ref 3.5–5.3)
Sodium: 140 mEq/L (ref 135–145)

## 2014-07-02 MED ORDER — TAMSULOSIN HCL 0.4 MG PO CAPS: ORAL_CAPSULE | ORAL | Status: DC

## 2014-07-02 MED ORDER — TAMSULOSIN HCL 0.4 MG PO CAPS: 0.4000 mg | ORAL_CAPSULE | Freq: Every day | ORAL | Status: AC

## 2014-07-02 NOTE — Telephone Encounter (Signed)
-----   Message from Jodelle GrossKathryn M Lawrence, NP sent at 07/02/2014 11:59 AM EST ----- Labs reviewed. Still anemic, but overall improved from prior lab values. Continue current mediation regimen. Follow up with Dr. Allyson SabalBerry concerning INR and coumadin dosing.

## 2014-07-02 NOTE — Telephone Encounter (Signed)
Pt aid made aware of results. Pt TBE with Dr. Purvis SheffieldKoneswaran on 1/29. Forwarded results to Dr. Allyson SabalBerry

## 2014-07-02 NOTE — Telephone Encounter (Signed)
Per Joni ReiningKathryn Lawrence, NP pt will f/u with pcp and Dr. Allyson SabalBerry. Dr. Allyson SabalBerry confirmed  flomax 0.4mg  daily. Confirmed with pharmacy and pt aide. Updated medication list

## 2014-07-02 NOTE — Telephone Encounter (Signed)
tamsulosin 0.4mg  was decreased to 0.2mg , form of capsule and 0.4mg  is lowest dose. Pharmacy recommended taking 0.4mg  every other day. Reordered medication with recommended instructions.

## 2014-07-08 LAB — POCT INR: INR: 2.5

## 2014-07-09 ENCOUNTER — Ambulatory Visit (INDEPENDENT_AMBULATORY_CARE_PROVIDER_SITE_OTHER): Payer: Medicaid Other | Admitting: Pharmacist Clinician (PhC)/ Clinical Pharmacy Specialist

## 2014-07-09 ENCOUNTER — Encounter: Payer: Self-pay | Admitting: Cardiovascular Disease

## 2014-07-09 DIAGNOSIS — I82409 Acute embolism and thrombosis of unspecified deep veins of unspecified lower extremity: Secondary | ICD-10-CM

## 2014-07-09 DIAGNOSIS — Z7901 Long term (current) use of anticoagulants: Secondary | ICD-10-CM

## 2014-07-10 ENCOUNTER — Telehealth: Payer: Self-pay | Admitting: *Deleted

## 2014-07-10 NOTE — Telephone Encounter (Signed)
Larita FifeLynn, pt aid is calling for directions on RX for flowmax

## 2014-07-10 NOTE — Telephone Encounter (Signed)
Spoke with Larita FifeLynn pt aide, and confirmed flomax 0.4 mg daily. Aide verbalized understanding.

## 2014-07-22 ENCOUNTER — Ambulatory Visit (INDEPENDENT_AMBULATORY_CARE_PROVIDER_SITE_OTHER): Payer: Medicaid Other | Admitting: Pharmacist Clinician (PhC)/ Clinical Pharmacy Specialist

## 2014-07-22 DIAGNOSIS — Z7901 Long term (current) use of anticoagulants: Secondary | ICD-10-CM

## 2014-07-22 DIAGNOSIS — I82409 Acute embolism and thrombosis of unspecified deep veins of unspecified lower extremity: Secondary | ICD-10-CM

## 2014-07-22 LAB — POCT INR: INR: 3.8

## 2014-08-02 ENCOUNTER — Encounter: Payer: Self-pay | Admitting: Cardiovascular Disease

## 2014-08-02 ENCOUNTER — Ambulatory Visit (INDEPENDENT_AMBULATORY_CARE_PROVIDER_SITE_OTHER): Payer: Medicare Other | Admitting: Cardiovascular Disease

## 2014-08-02 VITALS — BP 107/65 | HR 91 | Ht 72.0 in | Wt 184.0 lb

## 2014-08-02 DIAGNOSIS — R791 Abnormal coagulation profile: Secondary | ICD-10-CM

## 2014-08-02 DIAGNOSIS — I25810 Atherosclerosis of coronary artery bypass graft(s) without angina pectoris: Secondary | ICD-10-CM

## 2014-08-02 DIAGNOSIS — I82409 Acute embolism and thrombosis of unspecified deep veins of unspecified lower extremity: Secondary | ICD-10-CM

## 2014-08-02 DIAGNOSIS — E785 Hyperlipidemia, unspecified: Secondary | ICD-10-CM

## 2014-08-02 DIAGNOSIS — R55 Syncope and collapse: Secondary | ICD-10-CM

## 2014-08-02 DIAGNOSIS — Z87898 Personal history of other specified conditions: Secondary | ICD-10-CM

## 2014-08-02 DIAGNOSIS — Z9289 Personal history of other medical treatment: Secondary | ICD-10-CM

## 2014-08-02 NOTE — Patient Instructions (Signed)

## 2014-08-02 NOTE — Progress Notes (Signed)
Patient ID: Roy Hickman, male   DOB: [birth year], 74 y.o.   MRN: [id number]      SUBJECTIVE: The patient is a 74 year old male with a history of severe dementia, coronary artery disease with prior CABG, hypertension, hyperlipidemia, hypothyroidism, and DVT for which he takes warfarin. He was hospitalized for vasovagal syncope and demand ischemia in December 2015. Echocardiogram on 06/12/14 demonstrated mildly reduced left ventricular systolic function, EF 45-50%, moderate LVH with grade 1 diastolic dysfunction. He had previously been having problems with hypotension and Coreg and ASA were discontinued. He is doing well today and denies chest pain and shortness of breath. He uses 2L oxygen at night. He does INR home monitoring.  Review of Systems: As per "subjective", otherwise negative.  No Known Allergies  Current Outpatient Prescriptions  Medication Sig Dispense Refill  . acetaminophen-codeine (TYLENOL #3) 300-30 MG per tablet   0  . atorvastatin (LIPITOR) 40 MG tablet Take 40 mg by mouth at bedtime.     . diphenhydrAMINE (BENADRYL) 25 MG tablet Take 25 mg by mouth every 6 (six) hours as needed for itching, allergies or sleep.     . DULoxetine (CYMBALTA) 60 MG capsule Take 60 mg by mouth daily.    . folic acid ([name]) 1 MG tablet Take 1 mg by mouth daily.     Marland Kitchen levothyroxine (SYNTHROID, LEVOTHROID) 25 MCG tablet Take 25 mcg by mouth daily before breakfast.    . mupirocin ointment (BACTROBAN) 2 % Apply 1 application topically 3 (three) times daily.    Marland Kitchen nystatin-triamcinolone (MYCOLOG II) cream Apply 1 application topically as needed (skin irritation).     Marland Kitchen omeprazole (PRILOSEC) 20 MG capsule Take 20 mg by mouth daily.    . QUEtiapine (SEROQUEL) 50 MG tablet Take 50 mg by mouth 2 (two) times daily.     . tamsulosin (FLOMAX) 0.4 MG CAPS capsule Take 1 capsule (0.4 mg total) by mouth daily. 30 capsule 1  . warfarin (COUMADIN) 5 MG tablet Take 5 mg by mouth daily.      No current  facility-administered medications for this visit.    Past Medical History  Diagnosis Date  . Hypertension   . Diabetes mellitus   . MI (mitral incompetence)   . Myocardial infarction 2007    s/p CABG   . Coronary artery disease   . GERD (gastroesophageal reflux disease)   . Hyperlipidemia   . Left leg DVT   . Dementia     Past Surgical History  Procedure Laterality Date  . Elbow surgery      left  . Laminectomy    . Cervical spine surgery    . Coronary artery bypass graft  2007    LIMA to his LAD, and vein to a diagonal branch and PDA. His EF at that time was 35% to 40%.   . Back surgery    . Posterior cervical fusion/foraminotomy  07/21/2011    Procedure: POSTERIOR CERVICAL FUSION/FORAMINOTOMY LEVEL 5;  Surgeon: Karn Cassis, MD;  Location: [facility];  Service: Neurosurgery;  Laterality: N/A;  Cervical three to Thoracic one Laminectomy, posterolateral arthrodesis, with Lateral Mass screws    History   Social History  . Marital Status: Widowed    Spouse Name: N/A    Number of Children: N/A  . Years of Education: N/A   Occupational History  . Not on file.   Social History Main Topics  . Smoking status: Former Smoker -- 2.00 packs/day for 25 years  Types: Cigarettes    Start date: 04/01/1952    Quit date: 02/27/1987  . Smokeless tobacco: Never Used  . Alcohol Use: No     Comment: quit drinking in 1984  . Drug Use: No  . Sexual Activity: No   Other Topics Concern  . Not on file   Social History Narrative   [facility]. [facility] living in an [facility] by himself [facility]. Has 5 sisters, no brothers. [phone] or [phone]. Tomma RakersSherley Tucker, Steward DroneBrenda Sizemore.      Filed Vitals:   08/02/14 1357  Pulse: 107  Height: 6' (1.829 m)  Weight: 184 lb (83.462 kg)  SpO2: 95%   BP 107/65  Pulse 91 SpO2 95%   PHYSICAL EXAM General: NAD HEENT: Normal. Neck: No JVD, no thyromegaly. Lungs: Clear to auscultation  bilaterally with normal respiratory effort. CV: Nondisplaced PMI.  Regular rate and rhythm, normal S1/S2, no S3/S4, no murmur. No pretibial or periankle edema.   Abdomen: Soft, no distention.  Neurologic: Alert.Marland Kitchen.  Psych: Normal affect. Skin: Normal. Musculoskeletal: No gross deformities. Extremities: No clubbing or cyanosis.   ECG: Most recent ECG reviewed.      ASSESSMENT AND PLAN: 1. CAD with CABG: Continue Lipitor. No longer on Coreg nor ASA. 2. DVT: Continue warfarin which is managed at a different office. INR 3.8 on 1/18.  3. Hyperlipidemia: On Lipitor. No changes. 4. Ischemic cardiomyopathy: No evidence of heart failure. No changes to therapy. No longer on Coreg due to hypotension and syncope. Would avoid all medications which could precipitate hypotension as he is prone to vasovagal syncope.  Dispo: f/u 6 months.  Prentice DockerSuresh Jehad Bisono, M.D., F.A.C.C.

## 2014-08-05 LAB — POCT INR: INR: 3.9

## 2014-08-06 ENCOUNTER — Ambulatory Visit (INDEPENDENT_AMBULATORY_CARE_PROVIDER_SITE_OTHER): Payer: Medicare Other | Admitting: Pharmacist Clinician (PhC)/ Clinical Pharmacy Specialist

## 2014-08-06 DIAGNOSIS — I82409 Acute embolism and thrombosis of unspecified deep veins of unspecified lower extremity: Secondary | ICD-10-CM

## 2014-08-06 DIAGNOSIS — Z7901 Long term (current) use of anticoagulants: Secondary | ICD-10-CM

## 2014-08-19 LAB — POCT INR: INR: 2.8

## 2014-08-19 LAB — PROTIME-INR: INR: 2.8 — AB (ref 0.9–1.1)

## 2014-08-20 ENCOUNTER — Ambulatory Visit (INDEPENDENT_AMBULATORY_CARE_PROVIDER_SITE_OTHER): Payer: Medicare Other | Admitting: Pharmacist Clinician (PhC)/ Clinical Pharmacy Specialist

## 2014-08-20 DIAGNOSIS — Z7901 Long term (current) use of anticoagulants: Secondary | ICD-10-CM

## 2014-08-20 DIAGNOSIS — I82409 Acute embolism and thrombosis of unspecified deep veins of unspecified lower extremity: Secondary | ICD-10-CM

## 2014-09-02 ENCOUNTER — Ambulatory Visit (INDEPENDENT_AMBULATORY_CARE_PROVIDER_SITE_OTHER): Payer: Medicare Other | Admitting: Pharmacist Clinician (PhC)/ Clinical Pharmacy Specialist

## 2014-09-02 DIAGNOSIS — Z7901 Long term (current) use of anticoagulants: Secondary | ICD-10-CM

## 2014-09-02 DIAGNOSIS — I82409 Acute embolism and thrombosis of unspecified deep veins of unspecified lower extremity: Secondary | ICD-10-CM

## 2014-09-02 LAB — PROTIME-INR

## 2014-09-02 LAB — POCT INR: INR: 2.5

## 2014-09-16 ENCOUNTER — Ambulatory Visit (INDEPENDENT_AMBULATORY_CARE_PROVIDER_SITE_OTHER): Payer: Medicare Other | Admitting: Pharmacist Clinician (PhC)/ Clinical Pharmacy Specialist

## 2014-09-16 DIAGNOSIS — I82409 Acute embolism and thrombosis of unspecified deep veins of unspecified lower extremity: Secondary | ICD-10-CM

## 2014-09-16 DIAGNOSIS — Z7901 Long term (current) use of anticoagulants: Secondary | ICD-10-CM

## 2014-09-16 LAB — POCT INR: INR: 2

## 2014-09-16 LAB — PROTIME-INR: INR: 2 — AB (ref 0.9–1.1)

## 2014-10-07 ENCOUNTER — Ambulatory Visit (INDEPENDENT_AMBULATORY_CARE_PROVIDER_SITE_OTHER): Payer: Medicare Other | Admitting: Pharmacist Clinician (PhC)/ Clinical Pharmacy Specialist

## 2014-10-07 DIAGNOSIS — I82409 Acute embolism and thrombosis of unspecified deep veins of unspecified lower extremity: Secondary | ICD-10-CM

## 2014-10-07 DIAGNOSIS — Z7901 Long term (current) use of anticoagulants: Secondary | ICD-10-CM

## 2014-10-07 LAB — POCT INR: INR: 2

## 2014-10-07 LAB — PROTIME-INR: INR: 2 — AB (ref 0.9–1.1)

## 2014-10-21 ENCOUNTER — Ambulatory Visit (INDEPENDENT_AMBULATORY_CARE_PROVIDER_SITE_OTHER): Payer: Medicare Other | Admitting: Pharmacist Clinician (PhC)/ Clinical Pharmacy Specialist

## 2014-10-21 DIAGNOSIS — I82409 Acute embolism and thrombosis of unspecified deep veins of unspecified lower extremity: Secondary | ICD-10-CM

## 2014-10-21 DIAGNOSIS — Z7901 Long term (current) use of anticoagulants: Secondary | ICD-10-CM

## 2014-10-21 LAB — POCT INR: INR: 2.4

## 2014-10-21 LAB — PROTIME-INR: INR: 2.4 — AB (ref 0.9–1.1)

## 2014-11-11 LAB — POCT INR: INR: 2.4

## 2014-11-12 ENCOUNTER — Ambulatory Visit (INDEPENDENT_AMBULATORY_CARE_PROVIDER_SITE_OTHER): Payer: Medicare Other | Admitting: Pharmacist Clinician (PhC)/ Clinical Pharmacy Specialist

## 2014-11-12 DIAGNOSIS — I82409 Acute embolism and thrombosis of unspecified deep veins of unspecified lower extremity: Secondary | ICD-10-CM

## 2014-11-12 DIAGNOSIS — Z7901 Long term (current) use of anticoagulants: Secondary | ICD-10-CM

## 2014-11-25 LAB — POCT INR: INR: 2.1

## 2014-11-28 ENCOUNTER — Ambulatory Visit (INDEPENDENT_AMBULATORY_CARE_PROVIDER_SITE_OTHER): Payer: Medicare Other | Admitting: Pharmacist Clinician (PhC)/ Clinical Pharmacy Specialist

## 2014-11-28 DIAGNOSIS — I82409 Acute embolism and thrombosis of unspecified deep veins of unspecified lower extremity: Secondary | ICD-10-CM

## 2014-11-28 DIAGNOSIS — Z7901 Long term (current) use of anticoagulants: Secondary | ICD-10-CM

## 2014-12-16 ENCOUNTER — Ambulatory Visit (INDEPENDENT_AMBULATORY_CARE_PROVIDER_SITE_OTHER): Payer: Medicare Other | Admitting: Pharmacist Clinician (PhC)/ Clinical Pharmacy Specialist

## 2014-12-16 DIAGNOSIS — I82409 Acute embolism and thrombosis of unspecified deep veins of unspecified lower extremity: Secondary | ICD-10-CM

## 2014-12-16 DIAGNOSIS — Z7901 Long term (current) use of anticoagulants: Secondary | ICD-10-CM

## 2014-12-16 LAB — POCT INR: INR: 2

## 2014-12-17 LAB — PROTIME-INR

## 2014-12-30 LAB — POCT INR: INR: 1.6

## 2014-12-31 ENCOUNTER — Ambulatory Visit (INDEPENDENT_AMBULATORY_CARE_PROVIDER_SITE_OTHER): Payer: Medicare Other | Admitting: Pharmacist Clinician (PhC)/ Clinical Pharmacy Specialist

## 2014-12-31 DIAGNOSIS — Z7901 Long term (current) use of anticoagulants: Secondary | ICD-10-CM

## 2014-12-31 DIAGNOSIS — I82409 Acute embolism and thrombosis of unspecified deep veins of unspecified lower extremity: Secondary | ICD-10-CM

## 2015-01-13 ENCOUNTER — Ambulatory Visit (INDEPENDENT_AMBULATORY_CARE_PROVIDER_SITE_OTHER): Payer: Medicare Other | Admitting: Pharmacist Clinician (PhC)/ Clinical Pharmacy Specialist

## 2015-01-13 DIAGNOSIS — Z7901 Long term (current) use of anticoagulants: Secondary | ICD-10-CM

## 2015-01-13 DIAGNOSIS — I82409 Acute embolism and thrombosis of unspecified deep veins of unspecified lower extremity: Secondary | ICD-10-CM

## 2015-01-13 LAB — PROTIME-INR

## 2015-01-13 LAB — POCT INR: INR: 1.8

## 2015-01-27 ENCOUNTER — Ambulatory Visit (INDEPENDENT_AMBULATORY_CARE_PROVIDER_SITE_OTHER): Payer: Medicare Other | Admitting: Pharmacist Clinician (PhC)/ Clinical Pharmacy Specialist

## 2015-01-27 DIAGNOSIS — I82409 Acute embolism and thrombosis of unspecified deep veins of unspecified lower extremity: Secondary | ICD-10-CM

## 2015-01-27 DIAGNOSIS — Z7901 Long term (current) use of anticoagulants: Secondary | ICD-10-CM

## 2015-01-27 LAB — POCT INR: INR: 1.9

## 2015-01-31 ENCOUNTER — Encounter: Payer: Self-pay | Admitting: Cardiovascular Disease

## 2015-02-10 ENCOUNTER — Ambulatory Visit (INDEPENDENT_AMBULATORY_CARE_PROVIDER_SITE_OTHER): Payer: Medicare Other | Admitting: Pharmacist Clinician (PhC)/ Clinical Pharmacy Specialist

## 2015-02-10 DIAGNOSIS — Z7901 Long term (current) use of anticoagulants: Secondary | ICD-10-CM

## 2015-02-10 DIAGNOSIS — I82409 Acute embolism and thrombosis of unspecified deep veins of unspecified lower extremity: Secondary | ICD-10-CM

## 2015-02-10 LAB — POCT INR: INR: 2

## 2015-02-24 ENCOUNTER — Ambulatory Visit: Payer: Medicare Other | Admitting: Cardiovascular Disease

## 2015-02-25 LAB — POCT INR: INR: 1.9

## 2015-02-26 ENCOUNTER — Ambulatory Visit (INDEPENDENT_AMBULATORY_CARE_PROVIDER_SITE_OTHER): Payer: Medicare Other | Admitting: Pharmacist Clinician (PhC)/ Clinical Pharmacy Specialist

## 2015-02-26 DIAGNOSIS — Z7901 Long term (current) use of anticoagulants: Secondary | ICD-10-CM

## 2015-02-26 DIAGNOSIS — I82409 Acute embolism and thrombosis of unspecified deep veins of unspecified lower extremity: Secondary | ICD-10-CM

## 2015-03-13 LAB — POCT INR: INR: 2.4

## 2015-03-14 ENCOUNTER — Ambulatory Visit (INDEPENDENT_AMBULATORY_CARE_PROVIDER_SITE_OTHER): Payer: Medicare Other | Admitting: Pharmacist Clinician (PhC)/ Clinical Pharmacy Specialist

## 2015-03-14 DIAGNOSIS — I82409 Acute embolism and thrombosis of unspecified deep veins of unspecified lower extremity: Secondary | ICD-10-CM

## 2015-03-14 DIAGNOSIS — Z7901 Long term (current) use of anticoagulants: Secondary | ICD-10-CM

## 2015-03-24 LAB — POCT INR: INR: 1.9

## 2015-03-25 ENCOUNTER — Ambulatory Visit (INDEPENDENT_AMBULATORY_CARE_PROVIDER_SITE_OTHER): Payer: Medicare Other | Admitting: Pharmacist Clinician (PhC)/ Clinical Pharmacy Specialist

## 2015-03-25 DIAGNOSIS — I82409 Acute embolism and thrombosis of unspecified deep veins of unspecified lower extremity: Secondary | ICD-10-CM

## 2015-03-25 DIAGNOSIS — Z7901 Long term (current) use of anticoagulants: Secondary | ICD-10-CM

## 2015-05-02 ENCOUNTER — Ambulatory Visit (INDEPENDENT_AMBULATORY_CARE_PROVIDER_SITE_OTHER): Payer: Medicare Other | Admitting: Pharmacist Clinician (PhC)/ Clinical Pharmacy Specialist

## 2015-05-02 DIAGNOSIS — Z7901 Long term (current) use of anticoagulants: Secondary | ICD-10-CM

## 2015-05-02 DIAGNOSIS — I82409 Acute embolism and thrombosis of unspecified deep veins of unspecified lower extremity: Secondary | ICD-10-CM

## 2015-05-02 LAB — POCT INR: INR: 2.1

## 2015-05-23 ENCOUNTER — Ambulatory Visit (INDEPENDENT_AMBULATORY_CARE_PROVIDER_SITE_OTHER): Payer: Medicare Other | Admitting: Pharmacist Clinician (PhC)/ Clinical Pharmacy Specialist

## 2015-05-23 DIAGNOSIS — I82409 Acute embolism and thrombosis of unspecified deep veins of unspecified lower extremity: Secondary | ICD-10-CM

## 2015-05-23 DIAGNOSIS — Z7901 Long term (current) use of anticoagulants: Secondary | ICD-10-CM

## 2015-05-23 LAB — POCT INR: INR: 2.8

## 2015-06-10 ENCOUNTER — Ambulatory Visit (INDEPENDENT_AMBULATORY_CARE_PROVIDER_SITE_OTHER): Payer: Medicare Other | Admitting: Pharmacist Clinician (PhC)/ Clinical Pharmacy Specialist

## 2015-06-10 DIAGNOSIS — I82409 Acute embolism and thrombosis of unspecified deep veins of unspecified lower extremity: Secondary | ICD-10-CM

## 2015-06-10 DIAGNOSIS — Z7901 Long term (current) use of anticoagulants: Secondary | ICD-10-CM

## 2015-06-10 LAB — POCT INR: INR: 2

## 2015-06-26 LAB — POCT INR: INR: 2.2

## 2015-06-26 LAB — PROTIME-INR

## 2015-06-27 ENCOUNTER — Ambulatory Visit (INDEPENDENT_AMBULATORY_CARE_PROVIDER_SITE_OTHER): Payer: Medicare Other | Admitting: Pharmacist Clinician (PhC)/ Clinical Pharmacy Specialist

## 2015-06-27 DIAGNOSIS — Z7901 Long term (current) use of anticoagulants: Secondary | ICD-10-CM

## 2015-06-27 DIAGNOSIS — I82409 Acute embolism and thrombosis of unspecified deep veins of unspecified lower extremity: Secondary | ICD-10-CM

## 2015-07-03 ENCOUNTER — Encounter: Payer: Self-pay | Admitting: Cardiovascular Disease

## 2015-07-24 ENCOUNTER — Ambulatory Visit (INDEPENDENT_AMBULATORY_CARE_PROVIDER_SITE_OTHER): Payer: Medicare Other | Admitting: Pharmacist Clinician (PhC)/ Clinical Pharmacy Specialist

## 2015-07-24 DIAGNOSIS — Z7901 Long term (current) use of anticoagulants: Secondary | ICD-10-CM

## 2015-07-24 DIAGNOSIS — I82409 Acute embolism and thrombosis of unspecified deep veins of unspecified lower extremity: Secondary | ICD-10-CM

## 2015-07-24 LAB — POCT INR: INR: 3

## 2015-08-18 LAB — POCT INR: INR: 2.3

## 2015-08-20 ENCOUNTER — Ambulatory Visit (INDEPENDENT_AMBULATORY_CARE_PROVIDER_SITE_OTHER): Payer: Medicare Other | Admitting: Pharmacist Clinician (PhC)/ Clinical Pharmacy Specialist

## 2015-08-20 DIAGNOSIS — I82409 Acute embolism and thrombosis of unspecified deep veins of unspecified lower extremity: Secondary | ICD-10-CM

## 2015-08-20 DIAGNOSIS — Z7901 Long term (current) use of anticoagulants: Secondary | ICD-10-CM

## 2015-11-11 ENCOUNTER — Telehealth: Payer: Self-pay | Admitting: *Deleted

## 2015-11-11 NOTE — Telephone Encounter (Signed)
Kristin Please call Servando SnareLisa Sizemore [facility] [phone]) about this pt you have managed in the past.  I talked to someone in the home that states INR was 1.6 yesterday but you have not received INR results since 2/17.  It has been over 1 year since pt has been in office and is bed bound.  Hope you know more about there circumstances since I have never delt with them. Thanks, Misty StanleyLisa

## 2015-11-11 NOTE — Telephone Encounter (Signed)
Patient has questions about Coumadin but he has been doing it at home. I do not understand what patient is asking. Please call patient. / tg

## 2015-11-12 LAB — POCT INR: INR: 1.6

## 2015-11-13 ENCOUNTER — Ambulatory Visit (INDEPENDENT_AMBULATORY_CARE_PROVIDER_SITE_OTHER): Payer: Medicare Other | Admitting: Pharmacist Clinician (PhC)/ Clinical Pharmacy Specialist

## 2015-11-13 DIAGNOSIS — Z7901 Long term (current) use of anticoagulants: Secondary | ICD-10-CM

## 2015-11-13 DIAGNOSIS — I82409 Acute embolism and thrombosis of unspecified deep veins of unspecified lower extremity: Secondary | ICD-10-CM

## 2015-11-13 NOTE — Telephone Encounter (Signed)
See anticoag note

## 2015-11-25 ENCOUNTER — Telehealth: Payer: Self-pay | Admitting: Pharmacist

## 2015-11-25 NOTE — Telephone Encounter (Signed)
Returned call to [employer] Sizemore who states that pt is out of strips and supplier needs prescription to send additional supplies. The company reportedly faxed our office. Will try to track down forms.

## 2015-11-25 NOTE — Telephone Encounter (Signed)
Form completed needs Dr. Hazle CocaBerry's signature.

## 2015-12-04 ENCOUNTER — Telehealth: Payer: Self-pay | Admitting: *Deleted

## 2015-12-04 NOTE — Telephone Encounter (Signed)
Physician order for Pt/INR patient self-testing from [facility] signed by MD and faxed.

## 2016-01-02 ENCOUNTER — Inpatient Hospital Stay (HOSPITAL_COMMUNITY)
Admission: EM | Admit: 2016-01-02 | Discharge: 2016-01-05 | DRG: 812 | Disposition: A | Discharge status: Skilled Nursing Facility | Payer: Medicare Other | Attending: Internal Medicine | Admitting: Internal Medicine

## 2016-01-02 ENCOUNTER — Emergency Department (HOSPITAL_COMMUNITY): Payer: Medicare Other

## 2016-01-02 ENCOUNTER — Encounter (HOSPITAL_COMMUNITY): Payer: Self-pay | Admitting: Emergency Medicine

## 2016-01-02 DIAGNOSIS — I82502 Chronic embolism and thrombosis of unspecified deep veins of left lower extremity: Secondary | ICD-10-CM | POA: Diagnosis present

## 2016-01-02 DIAGNOSIS — Z66 Do not resuscitate: Secondary | ICD-10-CM | POA: Diagnosis present

## 2016-01-02 DIAGNOSIS — Z7189 Other specified counseling: Secondary | ICD-10-CM | POA: Insufficient documentation

## 2016-01-02 DIAGNOSIS — Z8249 Family history of ischemic heart disease and other diseases of the circulatory system: Secondary | ICD-10-CM | POA: Diagnosis not present

## 2016-01-02 DIAGNOSIS — F0390 Unspecified dementia without behavioral disturbance: Secondary | ICD-10-CM | POA: Diagnosis not present

## 2016-01-02 DIAGNOSIS — R58 Hemorrhage, not elsewhere classified: Secondary | ICD-10-CM | POA: Diagnosis not present

## 2016-01-02 DIAGNOSIS — T45515A Adverse effect of anticoagulants, initial encounter: Secondary | ICD-10-CM

## 2016-01-02 DIAGNOSIS — N400 Enlarged prostate without lower urinary tract symptoms: Secondary | ICD-10-CM | POA: Diagnosis present

## 2016-01-02 DIAGNOSIS — E039 Hypothyroidism, unspecified: Secondary | ICD-10-CM | POA: Diagnosis present

## 2016-01-02 DIAGNOSIS — E119 Type 2 diabetes mellitus without complications: Secondary | ICD-10-CM | POA: Diagnosis present

## 2016-01-02 DIAGNOSIS — E785 Hyperlipidemia, unspecified: Secondary | ICD-10-CM | POA: Diagnosis present

## 2016-01-02 DIAGNOSIS — I10 Essential (primary) hypertension: Secondary | ICD-10-CM | POA: Diagnosis present

## 2016-01-02 DIAGNOSIS — R0902 Hypoxemia: Secondary | ICD-10-CM | POA: Diagnosis present

## 2016-01-02 DIAGNOSIS — I252 Old myocardial infarction: Secondary | ICD-10-CM | POA: Diagnosis not present

## 2016-01-02 DIAGNOSIS — Z7901 Long term (current) use of anticoagulants: Secondary | ICD-10-CM

## 2016-01-02 DIAGNOSIS — R0602 Shortness of breath: Secondary | ICD-10-CM | POA: Diagnosis not present

## 2016-01-02 DIAGNOSIS — Z79899 Other long term (current) drug therapy: Secondary | ICD-10-CM

## 2016-01-02 DIAGNOSIS — D509 Iron deficiency anemia, unspecified: Secondary | ICD-10-CM | POA: Diagnosis present

## 2016-01-02 DIAGNOSIS — D500 Iron deficiency anemia secondary to blood loss (chronic): Secondary | ICD-10-CM | POA: Diagnosis present

## 2016-01-02 DIAGNOSIS — Z515 Encounter for palliative care: Secondary | ICD-10-CM | POA: Insufficient documentation

## 2016-01-02 DIAGNOSIS — R627 Adult failure to thrive: Secondary | ICD-10-CM | POA: Diagnosis present

## 2016-01-02 DIAGNOSIS — K922 Gastrointestinal hemorrhage, unspecified: Secondary | ICD-10-CM | POA: Diagnosis present

## 2016-01-02 DIAGNOSIS — I5042 Chronic combined systolic (congestive) and diastolic (congestive) heart failure: Secondary | ICD-10-CM | POA: Diagnosis present

## 2016-01-02 DIAGNOSIS — R1084 Generalized abdominal pain: Secondary | ICD-10-CM | POA: Diagnosis present

## 2016-01-02 DIAGNOSIS — I509 Heart failure, unspecified: Secondary | ICD-10-CM | POA: Diagnosis not present

## 2016-01-02 DIAGNOSIS — D649 Anemia, unspecified: Secondary | ICD-10-CM

## 2016-01-02 DIAGNOSIS — K219 Gastro-esophageal reflux disease without esophagitis: Secondary | ICD-10-CM | POA: Diagnosis present

## 2016-01-02 DIAGNOSIS — I472 Ventricular tachycardia: Secondary | ICD-10-CM | POA: Diagnosis present

## 2016-01-02 DIAGNOSIS — I110 Hypertensive heart disease with heart failure: Secondary | ICD-10-CM | POA: Diagnosis present

## 2016-01-02 DIAGNOSIS — I2510 Atherosclerotic heart disease of native coronary artery without angina pectoris: Secondary | ICD-10-CM | POA: Diagnosis present

## 2016-01-02 DIAGNOSIS — D62 Acute posthemorrhagic anemia: Principal | ICD-10-CM | POA: Diagnosis present

## 2016-01-02 DIAGNOSIS — R6251 Failure to thrive (child): Secondary | ICD-10-CM | POA: Diagnosis present

## 2016-01-02 DIAGNOSIS — Z951 Presence of aortocoronary bypass graft: Secondary | ICD-10-CM | POA: Diagnosis not present

## 2016-01-02 DIAGNOSIS — I82409 Acute embolism and thrombosis of unspecified deep veins of unspecified lower extremity: Secondary | ICD-10-CM | POA: Diagnosis present

## 2016-01-02 DIAGNOSIS — E876 Hypokalemia: Secondary | ICD-10-CM | POA: Diagnosis present

## 2016-01-02 DIAGNOSIS — G309 Alzheimer's disease, unspecified: Secondary | ICD-10-CM | POA: Diagnosis present

## 2016-01-02 DIAGNOSIS — F0280 Dementia in other diseases classified elsewhere without behavioral disturbance: Secondary | ICD-10-CM | POA: Diagnosis present

## 2016-01-02 DIAGNOSIS — Z87891 Personal history of nicotine dependence: Secondary | ICD-10-CM | POA: Diagnosis not present

## 2016-01-02 DIAGNOSIS — L8990 Pressure ulcer of unspecified site, unspecified stage: Secondary | ICD-10-CM | POA: Insufficient documentation

## 2016-01-02 DIAGNOSIS — R791 Abnormal coagulation profile: Secondary | ICD-10-CM | POA: Diagnosis present

## 2016-01-02 HISTORY — DX: Chronic combined systolic (congestive) and diastolic (congestive) heart failure: I50.42

## 2016-01-02 LAB — COMPREHENSIVE METABOLIC PANEL
ALK PHOS: 73 U/L (ref 38–126)
ALT: 7 U/L — AB (ref 17–63)
AST: 14 U/L — AB (ref 15–41)
Albumin: 2.4 g/dL — ABNORMAL LOW (ref 3.5–5.0)
Anion gap: 6 (ref 5–15)
BILIRUBIN TOTAL: 0.6 mg/dL (ref 0.3–1.2)
BUN: 13 mg/dL (ref 6–20)
CALCIUM: 7.9 mg/dL — AB (ref 8.9–10.3)
CHLORIDE: 107 mmol/L (ref 101–111)
CO2: 25 mmol/L (ref 22–32)
CREATININE: 0.77 mg/dL (ref 0.61–1.24)
GFR calc Af Amer: >60 mL/min (ref >60)
Glucose, Bld: 122 mg/dL — ABNORMAL HIGH (ref 65–99)
Potassium: 3 mmol/L — ABNORMAL LOW (ref 3.5–5.1)
Sodium: 138 mmol/L (ref 135–145)
Total Protein: 6 g/dL — ABNORMAL LOW (ref 6.5–8.1)

## 2016-01-02 LAB — PROTIME-INR
INR: 6.45 (ref 0.00–1.49)
Prothrombin Time: 52.8 seconds — ABNORMAL HIGH (ref 11.6–15.2)

## 2016-01-02 LAB — CBC WITH DIFFERENTIAL/PLATELET
Basophils Absolute: 0 10*3/uL (ref 0.0–0.1)
Basophils Relative: 0 %
Eosinophils Absolute: 0.1 10*3/uL (ref 0.0–0.7)
Eosinophils Relative: 1 %
HEMATOCRIT: 19.4 % — AB (ref 39.0–52.0)
HEMOGLOBIN: 5.4 g/dL — AB (ref 13.0–17.0)
LYMPHS PCT: 14 %
Lymphs Abs: 1.1 10*3/uL (ref 0.7–4.0)
MCH: 17.8 pg — AB (ref 26.0–34.0)
MCHC: 27.8 g/dL — ABNORMAL LOW (ref 30.0–36.0)
MCV: 63.8 fL — AB (ref 78.0–100.0)
Monocytes Absolute: 0.8 10*3/uL (ref 0.1–1.0)
Monocytes Relative: 10 %
NEUTROS ABS: 6.1 10*3/uL (ref 1.7–7.7)
Neutrophils Relative %: 75 %
PLATELETS: 366 10*3/uL (ref 150–400)
RBC: 3.04 MIL/uL — AB (ref 4.22–5.81)
RDW: 19.3 % — ABNORMAL HIGH (ref 11.5–15.5)
WBC: 8.1 10*3/uL (ref 4.0–10.5)

## 2016-01-02 LAB — ABO/RH: ABO/RH(D): O POS

## 2016-01-02 LAB — I-STAT TROPONIN, ED: TROPONIN I, POC: 0.03 ng/mL (ref 0.00–0.08)

## 2016-01-02 LAB — POC OCCULT BLOOD, ED: FECAL OCCULT BLD: POSITIVE — AB

## 2016-01-02 LAB — PREPARE RBC (CROSSMATCH)

## 2016-01-02 MED ORDER — SODIUM CHLORIDE 0.9 % IV SOLN: 10.0000 mL/h | Freq: Once | INTRAVENOUS | Status: DC

## 2016-01-02 MED ORDER — POTASSIUM CHLORIDE CRYS ER 20 MEQ PO TBCR
40.0000 meq | EXTENDED_RELEASE_TABLET | Freq: Once | ORAL | Status: AC
  Administered 2016-01-02: 40 meq via ORAL
  Filled 2016-01-02: qty 2

## 2016-01-02 MED ORDER — SODIUM CHLORIDE 0.9 % IV SOLN
INTRAVENOUS | Status: DC
  Administered 2016-01-02 – 2016-01-03 (×2): via INTRAVENOUS

## 2016-01-02 NOTE — ED Notes (Signed)
Note:  entry at 2240 was made in error.

## 2016-01-02 NOTE — ED Notes (Signed)
[facility] from [facility] with c/o failure to thrive.  Per EMS, pt's family reported that pt has "not been eating for 3 days".  Pt denies pain or discomfort.  Has hx of dementia.   Pt's CBG was 102 by EMS at the scene.  Pt was given NS en route to ED---- arrived here alert and verbally appropriate.

## 2016-01-02 NOTE — ED Provider Notes (Signed)
CSN: [id number]     Arrival date & time 01/02/16  2013 History   First MD Initiated Contact with Patient 01/02/16 2032     Chief Complaint  Patient presents with  . Failure To Thrive     (Consider location/radiation/quality/duration/timing/severity/associated sxs/prior Treatment) The history is provided by the patient and medical records.    Level V caveat: Dementia 75 year old male with history of hypertension, diabetes, history of MI status post CABG, coronary artery disease, GERD, history of DVT on chronic Coumadin, presenting to the ED by EMS from home due to failure to thrive. Per family, patient was reportedly not eating for the past 3 days. He states he has felt a little bit hungry but is more so been drinking fluids. He states he has no pain or discomfort currently. Patient was given some IV fluids and route to the ED. On my evaluation, patient is awake and alert. He continues to deny any complaints. He did have oxygen saturation of 89% on room air, patient uses 2 L at baseline nightly.  Past Medical History  Diagnosis Date  . Hypertension   . Diabetes mellitus   . MI (mitral incompetence)   . Myocardial infarction [facility](H[facility]) 2007    s/p CABG   . Coronary artery disease   . GERD (gastroesophageal reflux disease)   . Hyperlipidemia   . Left leg DVT (HCC)   . Dementia    Past Surgical History  Procedure Laterality Date  . Elbow surgery      left  . Laminectomy    . Cervical spine surgery    . Coronary artery bypass graft  2007    LIMA to his LAD, and vein to a diagonal branch and PDA. His EF at that time was 35% to 40%.   . Back surgery    . Posterior cervical fusion/foraminotomy  07/21/2011    Procedure: POSTERIOR CERVICAL FUSION/FORAMINOTOMY LEVEL 5;  Surgeon: Karn CassisErnesto M Botero, MD;  Location: [facility];  Service: Neurosurgery;  Laterality: N/A;  Cervical three to Thoracic one Laminectomy, posterolateral arthrodesis, with Lateral Mass screws   Family History  Problem  Relation Age of Onset  . Heart failure Father   . Heart failure Sister    Social History  Substance Use Topics  . Smoking status: Former Smoker -- 2.00 packs/day for 25 years    Types: Cigarettes    Start date: 04/01/1952    Quit date: 02/27/1987  . Smokeless tobacco: Never Used  . Alcohol Use: No     Comment: quit drinking in 1984    Review of Systems  Unable to perform ROS: Dementia      Allergies  Review of patient's allergies indicates no known allergies.  Home Medications   Prior to Admission medications   Medication Sig Start Date End Date Taking? Authorizing Provider  acetaminophen-codeine (TYLENOL #3) 300-30 MG per tablet  06/24/14   Historical Provider, MD  atorvastatin (LIPITOR) 40 MG tablet Take 40 mg by mouth at bedtime.     Historical Provider, MD  diphenhydrAMINE (BENADRYL) 25 MG tablet Take 25 mg by mouth every 6 (six) hours as needed for itching, allergies or sleep.     Historical Provider, MD  DULoxetine (CYMBALTA) 60 MG capsule Take 60 mg by mouth daily.    Historical Provider, MD  folic acid ([name]) 1 MG tablet Take 1 mg by mouth daily.     Historical Provider, MD  levothyroxine (SYNTHROID, LEVOTHROID) 25 MCG tablet Take 25 mcg by mouth daily before  breakfast.    Historical Provider, MD  mupirocin ointment (BACTROBAN) 2 % Apply 1 application topically 3 (three) times daily.    Historical Provider, MD  nystatin-triamcinolone (MYCOLOG II) cream Apply 1 application topically as needed (skin irritation).  12/18/12   Historical Provider, MD  omeprazole (PRILOSEC) 20 MG capsule Take 20 mg by mouth daily.    Historical Provider, MD  QUEtiapine (SEROQUEL) 50 MG tablet Take 50 mg by mouth 2 (two) times daily.  01/24/13   Historical Provider, MD  tamsulosin (FLOMAX) 0.4 MG CAPS capsule Take 1 capsule (0.4 mg total) by mouth daily. 07/02/14   Jodelle GrossKathryn M Lawrence, NP  warfarin (COUMADIN) 5 MG tablet Take 5 mg by mouth daily.     Historical Provider, MD   BP 112/54 mmHg   Pulse 92  Temp(Src) 98 F (36.7 C) (Oral)  Resp 18  SpO2 94%   Physical Exam  Constitutional: He is oriented to person, place, and time. He appears well-developed and well-nourished.  Very pale in appearance  HENT:  Head: Normocephalic and atraumatic.  Mouth/Throat: Oropharynx is clear and moist.  Eyes: Conjunctivae and EOM are normal. Pupils are equal, round, and reactive to light.  Neck: Normal range of motion.  Cardiovascular: Normal rate, regular rhythm and normal heart sounds.   Pulmonary/Chest: Effort normal and breath sounds normal. No respiratory distress. He has no wheezes.  Abdominal: Soft. Bowel sounds are normal.  Genitourinary: Guaiac positive stool.  Wound present on right buttock with bandage in place Rectum normal in appearance, specks of bright red blood noted in light brown stool  Musculoskeletal: Normal range of motion.  Neurological: He is alert and oriented to person, place, and time.  Skin: Skin is warm and dry.  Psychiatric: He has a normal mood and affect.  Nursing note and vitals reviewed.   ED Course  Procedures (including critical care time)  CRITICAL CARE Performed by: Garlon HatchetSANDERS, Dymin Dingledine M   Total critical care time:  45 minutes  Critical care time was exclusive of separately billable procedures and treating other patients.  Critical care was necessary to treat or prevent imminent or life-threatening deterioration.  Critical care was time spent personally by me on the following activities: development of treatment plan with patient and/or surrogate as well as nursing, discussions with consultants, evaluation of patient's response to treatment, examination of patient, obtaining history from patient or surrogate, ordering and performing treatments and interventions, ordering and review of laboratory studies, ordering and review of radiographic studies, pulse oximetry and re-evaluation of patient's condition.  Medications  0.9 %  sodium chloride  infusion ( Intravenous Hold 01/02/16 2320)  potassium chloride SA (K-DUR,KLOR-CON) CR tablet 40 mEq (40 mEq Oral Given 01/02/16 2220)   Labs Review Labs Reviewed  CBC WITH DIFFERENTIAL/PLATELET - Abnormal; Notable for the following:    RBC 3.04 (*)    Hemoglobin 5.4 (*)    HCT 19.4 (*)    MCV 63.8 (*)    MCH 17.8 (*)    MCHC 27.8 (*)    RDW 19.3 (*)    All other components within normal limits  COMPREHENSIVE METABOLIC PANEL - Abnormal; Notable for the following:    Potassium 3.0 (*)    Glucose, Bld 122 (*)    Calcium 7.9 (*)    Total Protein 6.0 (*)    Albumin 2.4 (*)    AST 14 (*)    ALT 7 (*)    All other components within normal limits  PROTIME-INR - Abnormal; Notable for  the following:    Prothrombin Time 52.8 (*)    INR 6.45 (*)    All other components within normal limits  POC OCCULT BLOOD, ED - Abnormal; Notable for the following:    Fecal Occult Bld POSITIVE (*)    All other components within normal limits  URINALYSIS, ROUTINE W REFLEX MICROSCOPIC (NOT AT [facility])  I-STAT TROPOININ, ED  TYPE AND SCREEN  PREPARE RBC (CROSSMATCH)  ABO/RH    Imaging Review Dg Chest 2 View  01/02/2016  CLINICAL DATA:  75 year old male with failure to thrive EXAM: CHEST  2 VIEW COMPARISON:  Chest radiograph dated 06/10/2014 FINDINGS: Two views of the chest demonstrate emphysematous changes of the lungs. There is no focal consolidation, pleural effusion, or pneumothorax. The cardiac silhouette is within normal limits. Median sternotomy wires and CABG vascular clips noted. The bones are osteopenic. No acute fracture. IMPRESSION: No active cardiopulmonary disease. Electronically Signed   By: Elgie Collard M.D.   On: 01/02/2016 21:19   Ct Head Wo Contrast  01/02/2016  CLINICAL DATA:  75 year old male with altered mental status. EXAM: CT HEAD WITHOUT CONTRAST TECHNIQUE: Contiguous axial images were obtained from the base of the skull through the vertex without intravenous contrast. COMPARISON:   Head CT dated 06/11/2014 FINDINGS: There is moderate age-related atrophy and chronic microvascular ischemic changes. No acute intracranial hemorrhage. No mass effect or midline shift. The visualized paranasal sinuses and mastoid air cells are clear. The calvarium is intact. IMPRESSION: No acute intracranial hemorrhage. Age-related atrophy and chronic microvascular ischemic disease. If symptoms persist and there are no contraindications, MRI may provide better evaluation if clinically indicated. Electronically Signed   By: Elgie Collard M.D.   On: 01/02/2016 22:59   I have personally reviewed and evaluated these images and lab results as part of my medical decision-making.   EKG Interpretation None      MDM   Final diagnoses:  Anemia due to GI blood loss  Supratherapeutic INR  Bleeding on Coumadin [facility])   75 year old male here with failure to thrive for the past 3 days. He denies any physical complaints at this time. His vital signs are stable. He was initially hypoxic on room air to 89%, he does use 2 L at baseline nightly. With 2 L his saturations are within normal limits.  Will plan for labs, urine, chest x-ray.  9:59 PM Patient's hemoglobin is low at 5.4. His baseline is around 8-9. His INR is also elevated at 6.45. Last INR on record was nearly 2 months ago and was 1.6 at that time. Rectal exam with specks of bright red blood present in light brown stool. No melena or active bleeding.  Abdomen is soft, benign.  Patient likely with slow bleed over the past few weeks.  Will hold reversal at this time.  Family has arrived at bedside, admits that patient has seemed somewhat altered lately "not in his right mind" and "worse than normal" the past few days.  Will add head CT to ensure no ICH. Patient agrees to blood transfusion.  Type and screen with crossmatch for 2 units ordered. Patient remains hemodynamically stable.  Patient will need admission and GI consultation.  11:16 PM Head CT  negative for acute findings. Blood infusing, patient remained hemodynamically stable. Case discussed with [facility], Dr. Bosie Clos-- will see patient in the morning, likely scope.  Patient admitted to medicine service.  Temp admit orders placed-- Dr. Clyde Lundborg, tele, inpatient.  Patient remains hemodynamically stable.  Garlon Hatchet, PA-C  01/03/16 0013  Alvira Monday, MD 01/04/16 1531

## 2016-01-02 NOTE — ED Notes (Signed)
Critical HGB 5.4  Read back and verified with Vonna KotykJay in lab  Primary RN, PA & MD notified.

## 2016-01-02 NOTE — H&P (Signed)
History and Physical    LEVEON PELZER Hickman:[id number] DOB: 1941/04/15 DOA: 01/02/2016  Referring MD/NP/PA:   PCP: Remus Loffler, PA   Patient coming from:  The patient is coming from home.  At baseline, pt is particially dependent for most of ADL.  Chief Complaint: Failure to thrive  HPI: Roy Hickman is a 75 y.o. male with medical history significant of hypertension, hyperlipidemia, diabetes mellitus, GERD, hypothyroidism, depression, dementia, CAD, s/p CABG 2007, DVT on Coumadin, BPH, CHF, who presents with failure to thrive.  Patient has dementia and is unable to provide accurate medical history, therefore, most of the history is obtained by discussing the case with ED physician, per family, and with the nursing staff.   Per his sister, pt was noted to have failure to thrive recently. pt has "not been eating for 3 days".no specific complaints or pain. Patient may have mild dry cough per sister, but does not seem to have chest pain, fever or chills. No nausea, vomiting or diarrhea noted. Patient moves all extremities. No black stool or bloody stool, no hematuria. He was found to have hemoglobin drop from 9.6 on 06/21/14-->5.4 on admission. Patient never had EGD or colonoscopy in the past.  ED Course: pt was found to have positive FOBT, INR 6.45, WBC 8.1, temperature normal, no tachycardia, potassium 3.0, creatinine normal, negative urinalysis, negative chest x-ray for acute abnormalities, negative CT head for acute intracranial abnormalities. Patient is admitted to telemetry bed as inpt. Dr. Levin Erp was consulted.  Review of Systems: Could not be reviewed accurately due to dementia.  Allergy: No Known Allergies  Past Medical History  Diagnosis Date  . Hypertension   . Diabetes mellitus   . MI (mitral incompetence)   . Myocardial infarction [facility]) 2007    s/p CABG   . Coronary artery disease   . GERD (gastroesophageal reflux disease)   . Hyperlipidemia   . Left leg DVT (HCC)   .  Dementia   . Chronic combined systolic and diastolic congestive heart failure [facility])     Past Surgical History  Procedure Laterality Date  . Elbow surgery      left  . Laminectomy    . Cervical spine surgery    . Coronary artery bypass graft  2007    LIMA to his LAD, and vein to a diagonal branch and PDA. His EF at that time was 35% to 40%.   . Back surgery    . Posterior cervical fusion/foraminotomy  07/21/2011    Procedure: POSTERIOR CERVICAL FUSION/FORAMINOTOMY LEVEL 5;  Surgeon: Karn Cassis, MD;  Location: [facility] NEURO ORS;  Service: Neurosurgery;  Laterality: N/A;  Cervical three to Thoracic one Laminectomy, posterolateral arthrodesis, with Lateral Mass screws    Social History:  reports that he quit smoking about 28 years ago. His smoking use included Cigarettes. He started smoking about 63 years ago. He has a 50 pack-year smoking history. He has never used smokeless tobacco. He reports that he does not drink alcohol or use illicit drugs.  Family History:  Family History  Problem Relation Age of Onset  . Heart failure Father   . Heart failure Sister      Prior to Admission medications   Medication Sig Start Date End Date Taking? Authorizing Provider  acetaminophen (TYLENOL) 500 MG tablet Take 500 mg by mouth every 6 (six) hours as needed for moderate pain or headache.   Yes Historical Provider, MD  acetaminophen-codeine (TYLENOL #3) 300-30 MG per tablet Take 1  tablet by mouth every 8 (eight) hours as needed for moderate pain.  06/24/14  Yes Historical Provider, MD  atorvastatin (LIPITOR) 40 MG tablet Take 40 mg by mouth at bedtime.    Yes Historical Provider, MD  DULoxetine (CYMBALTA) 60 MG capsule Take 60 mg by mouth daily.   Yes Historical Provider, MD  folic acid ([name]) 1 MG tablet Take 1 mg by mouth daily.    Yes Historical Provider, MD  hydrOXYzine ([name]) 25 MG tablet Take 25 mg by mouth every 8 (eight) hours as needed for anxiety or itching.   Yes Historical  Provider, MD  levothyroxine (SYNTHROID, LEVOTHROID) 25 MCG tablet Take 25 mcg by mouth daily before breakfast.   Yes Historical Provider, MD  mupirocin ointment (BACTROBAN) 2 % Apply 1 application topically 3 (three) times daily as needed (but sores).    Yes Historical Provider, MD  nystatin-triamcinolone (MYCOLOG II) cream Apply 1 application topically as needed (skin irritation).  12/18/12  Yes Historical Provider, MD  omeprazole (PRILOSEC) 20 MG capsule Take 20 mg by mouth daily.   Yes Historical Provider, MD  QUEtiapine (SEROQUEL) 50 MG tablet Take 50 mg by mouth 2 (two) times daily.  01/24/13  Yes Historical Provider, MD  tamsulosin (FLOMAX) 0.4 MG CAPS capsule Take 1 capsule (0.4 mg total) by mouth daily. 07/02/14  Yes Jodelle Gross, NP  warfarin (COUMADIN) 5 MG tablet Take 5 mg by mouth daily.    Yes Historical Provider, MD    Physical Exam: Filed Vitals:   01/03/16 0134 01/03/16 0306 01/03/16 0336 01/03/16 0535  BP: 156/83 145/79 141/62 152/74  Pulse: 81 84 71 80  Temp: 97.6 F (36.4 C) 97.5 F (36.4 C) 97.4 F (36.3 C) 97.3 F (36.3 C)  TempSrc: Oral Oral Axillary Axillary  Resp: 18 16 16 18   Height:      Weight:      SpO2: 100% 100% 100% 100%   General: Not in acute distress. Pale looking. HEENT:       Eyes: PERRL, EOMI, no scleral icterus.       ENT: No discharge from the ears and nose, no pharynx injection, no tonsillar enlargement.        Neck: No JVD, no bruit, no mass felt. Heme: No neck lymph node enlargement. Cardiac: S1/S2, RRR, No murmurs, No gallops or rubs. Pulm: No rales, wheezing, rhonchi or rubs. Abd: Soft, nondistended, nontender, no rebound pain, no organomegaly, BS present. GU: No hematuria Ext: No pitting leg edema bilaterally. 2+DP/PT pulse bilaterally. Musculoskeletal: No joint deformities, No joint redness or warmth, no limitation of ROM in spin. Skin: No rashes.  Neuro: Alert, oriented to place and person, but not to time, cranial nerves  II-XII grossly intact, moves all extremities normally. Psych: Patient is not psychotic, no suicidal or hemocidal ideation.  Labs on Admission: I have personally reviewed following labs and imaging studies  CBC:  Recent Labs Lab 01/02/16 2059  WBC 8.1  NEUTROABS 6.1  HGB 5.4*  HCT 19.4*  MCV 63.8*  PLT 366   Basic Metabolic Panel:  Recent Labs Lab 01/02/16 2059  NA 138  K 3.0*  CL 107  CO2 25  GLUCOSE 122*  BUN 13  CREATININE 0.77  CALCIUM 7.9*   GFR: Estimated Creatinine Clearance: 84.9 mL/min (by C-G formula based on Cr of 0.77). Liver Function Tests:  Recent Labs Lab 01/02/16 2059  AST 14*  ALT 7*  ALKPHOS 73  BILITOT 0.6  PROT 6.0*  ALBUMIN 2.4*  No results for input(s): LIPASE, AMYLASE in the last 168 hours. No results for input(s): AMMONIA in the last 168 hours. Coagulation Profile:  Recent Labs Lab 01/02/16 2059  INR 6.45*   Cardiac Enzymes: No results for input(s): CKTOTAL, CKMB, CKMBINDEX, TROPONINI in the last 168 hours. BNP (last 3 results) No results for input(s): PROBNP in the last 8760 hours. HbA1C: No results for input(s): HGBA1C in the last 72 hours. CBG:  Recent Labs Lab 01/03/16 0048  GLUCAP 130*   Lipid Profile: No results for input(s): CHOL, HDL, LDLCALC, TRIG, CHOLHDL, LDLDIRECT in the last 72 hours. Thyroid Function Tests: No results for input(s): TSH, T4TOTAL, FREET4, T3FREE, THYROIDAB in the last 72 hours. Anemia Panel: No results for input(s): VITAMINB12, FOLATE, FERRITIN, TIBC, IRON, RETICCTPCT in the last 72 hours. Urine analysis:    Component Value Date/Time   COLORURINE AMBER* 01/03/2016 0036   APPEARANCEUR CLEAR 01/03/2016 0036   LABSPEC 1.024 01/03/2016 0036   PHURINE 6.0 01/03/2016 0036   GLUCOSEU NEGATIVE 01/03/2016 0036   HGBUR NEGATIVE 01/03/2016 0036   BILIRUBINUR MODERATE* 01/03/2016 0036   KETONESUR 15* 01/03/2016 0036   PROTEINUR NEGATIVE 01/03/2016 0036   UROBILINOGEN 0.2 06/10/2014 1227    NITRITE NEGATIVE 01/03/2016 0036   LEUKOCYTESUR TRACE* 01/03/2016 0036   Sepsis Labs: @LABRCNTIP (procalcitonin:4,lacticidven:4) ) Recent Results (from the past 240 hour(s))  MRSA PCR Screening     Status: None   Collection Time: 01/03/16 12:15 AM  Result Value Ref Range Status   MRSA by PCR NEGATIVE NEGATIVE Final    Comment:        The GeneXpert MRSA Assay (FDA approved for NASAL specimens only), is one component of a comprehensive MRSA colonization surveillance program. It is not intended to diagnose MRSA infection nor to guide or monitor treatment for MRSA infections.      Radiological Exams on Admission: Dg Chest 2 View  01/02/2016  CLINICAL DATA:  75 year old male with failure to thrive EXAM: CHEST  2 VIEW COMPARISON:  Chest radiograph dated 06/10/2014 FINDINGS: Two views of the chest demonstrate emphysematous changes of the lungs. There is no focal consolidation, pleural effusion, or pneumothorax. The cardiac silhouette is within normal limits. Median sternotomy wires and CABG vascular clips noted. The bones are osteopenic. No acute fracture. IMPRESSION: No active cardiopulmonary disease. Electronically Signed   By: Elgie CollardArash  Radparvar M.D.   On: 01/02/2016 21:19   Ct Head Wo Contrast  01/02/2016  CLINICAL DATA:  75 year old male with altered mental status. EXAM: CT HEAD WITHOUT CONTRAST TECHNIQUE: Contiguous axial images were obtained from the base of the skull through the vertex without intravenous contrast. COMPARISON:  Head CT dated 06/11/2014 FINDINGS: There is moderate age-related atrophy and chronic microvascular ischemic changes. No acute intracranial hemorrhage. No mass effect or midline shift. The visualized paranasal sinuses and mastoid air cells are clear. The calvarium is intact. IMPRESSION: No acute intracranial hemorrhage. Age-related atrophy and chronic microvascular ischemic disease. If symptoms persist and there are no contraindications, MRI may provide better  evaluation if clinically indicated. Electronically Signed   By: Elgie CollardArash  Radparvar M.D.   On: 01/02/2016 22:59     EKG: Independently reviewed. Sinus rhythm, QTC 459, PVC, nonspecific T-wave change.  Assessment/Plan Principal Problem:   GIB (gastrointestinal bleeding) Active Problems:   CAD, CABG X 3 11/07. Myoview low risk 1/13   DM2 (diabetes mellitus, type 2) (HCC)   HTN (hypertension)   DVT of leg (deep venous thrombosis) (HCC)   Long term current use of anticoagulant therapy  Dyslipidemia   Dementia   Failure to thrive (0-17)   Anemia due to GI blood loss   Bleeding on Coumadin (HCC)   Supratherapeutic INR   Hypothyroidism   Chronic combined systolic and diastolic congestive heart failure (HCC)   Hypokalemia   GIB (gastrointestinal bleeding): hgb 5.4. Patient is asymptomatic, but due to dementia, patient may not be able to tell. This is most likely due to supratherapeutic INR of 6.45. He may have underlying GI problems. LFT normal. Never had EGD and a colonoscopy before. GI, Dr. Levin ErpSchoolar was consulted.   will admit to tele bed as inpt - will transfuse 2 units of blood - will reverse INR by giving Vk 10 mg IV - GI consulted by Ed, will follow up recommendations - NPO - NS at 75 mL/hr - Start IV pantoprazole 40 mg bib - Zofran IV for nausea - Avoid NSAIDs and SQ heparin - Maintain IV access (2 large bore IVs if possible). - Monitor closely and follow q6h cbc, transfuse as necessary. - LaB: INR, PTT  CAD: CABG X 3 11/07. Myoview low risk 1/13: no CP -continue lipiotor  Diet controlled DM-II: Last A1c 7.1, well controled. Patient is not taking medications at home -SSI  HTN: Blood pressure normal. Not taking medications except for Flomax -continue Flomax which is for BPH  BPH: stable - Continue Flomax  Hx of DVT: On Coumadin, now with supertherapeutic INR 6.45 and GI bleeding -hold Coumadin -give 10 mg Vk by IV -check INR in AM  Dyslipidemia: Last LDL was not  on record -Continue home medications: Lipitor  Anemia due to GI blood loss: -transfuse 2 units of blood -f/u by CBC  Depression: -Continue home medications: Seroquel  Hypothyroidism: Last TSH was 3.850, 06/10/14 -Continue home Synthroid  Hypokalemia: K= 3.0 on admission. - Repleted - Check Mg level  Combined systolic and diastolic congestive heart failure: 2-D echo on 06/12/14 showed EF 45-50 percent with grade 1 diastolic dysfunction. Patient is not taking diuretics at home. No leg edema or JVD. CHF is compensated. -Check BNP   DVT ppx: none ( pt has GIB, can not use heparin; has hx of DVT, can not use SCD, with supratherapeutic INR on admission). Code Status: Full code Family Communication:  Yes, patient's sister and niece at bed side Disposition Plan:  Anticipate discharge back to previous home environment Consults called: GI, Dr. Levin ErpSchoolar  Admission status: inp / tele   Date of Service 01/03/2016    Lorretta HarpIU, Quenisha Lovins [name] Pager [phone]  If 7PM-7AM, please contact night-coverage [email or web address] Password [email or web address] 01/03/2016, 7:04 AM

## 2016-01-02 NOTE — ED Notes (Signed)
Critical INR 6.45  Read back and verified with Vonna KotykJay in lab Primary RN and PA/MD aware

## 2016-01-02 NOTE — ED Notes (Signed)
Bed: [id number] Expected date:  Expected time:  Means of arrival:  Comments: 589 M AMS

## 2016-01-03 ENCOUNTER — Inpatient Hospital Stay (HOSPITAL_COMMUNITY): Payer: Medicare Other

## 2016-01-03 ENCOUNTER — Encounter (HOSPITAL_COMMUNITY): Payer: Self-pay | Admitting: Internal Medicine

## 2016-01-03 DIAGNOSIS — R0602 Shortness of breath: Secondary | ICD-10-CM

## 2016-01-03 DIAGNOSIS — T45515A Adverse effect of anticoagulants, initial encounter: Secondary | ICD-10-CM

## 2016-01-03 DIAGNOSIS — I5042 Chronic combined systolic (congestive) and diastolic (congestive) heart failure: Secondary | ICD-10-CM | POA: Diagnosis present

## 2016-01-03 DIAGNOSIS — E039 Hypothyroidism, unspecified: Secondary | ICD-10-CM | POA: Diagnosis present

## 2016-01-03 DIAGNOSIS — E876 Hypokalemia: Secondary | ICD-10-CM | POA: Diagnosis present

## 2016-01-03 DIAGNOSIS — D500 Iron deficiency anemia secondary to blood loss (chronic): Secondary | ICD-10-CM | POA: Diagnosis present

## 2016-01-03 DIAGNOSIS — L8990 Pressure ulcer of unspecified site, unspecified stage: Secondary | ICD-10-CM | POA: Insufficient documentation

## 2016-01-03 DIAGNOSIS — R791 Abnormal coagulation profile: Secondary | ICD-10-CM | POA: Diagnosis present

## 2016-01-03 DIAGNOSIS — R58 Hemorrhage, not elsewhere classified: Secondary | ICD-10-CM | POA: Diagnosis present

## 2016-01-03 DIAGNOSIS — I509 Heart failure, unspecified: Secondary | ICD-10-CM

## 2016-01-03 LAB — URINALYSIS, ROUTINE W REFLEX MICROSCOPIC
Glucose, UA: NEGATIVE mg/dL
HGB URINE DIPSTICK: NEGATIVE
Ketones, ur: 15 mg/dL — AB
Nitrite: NEGATIVE
PH: 6 (ref 5.0–8.0)
Protein, ur: NEGATIVE mg/dL
SPECIFIC GRAVITY, URINE: 1.024 (ref 1.005–1.030)

## 2016-01-03 LAB — BASIC METABOLIC PANEL
Anion gap: 4 — ABNORMAL LOW (ref 5–15)
BUN: 12 mg/dL (ref 6–20)
CALCIUM: 7.9 mg/dL — AB (ref 8.9–10.3)
CHLORIDE: 109 mmol/L (ref 101–111)
CO2: 27 mmol/L (ref 22–32)
CREATININE: 0.73 mg/dL (ref 0.61–1.24)
GFR calc non Af Amer: >60 mL/min (ref >60)
GLUCOSE: 114 mg/dL — AB (ref 65–99)
Potassium: 3.5 mmol/L (ref 3.5–5.1)
Sodium: 140 mmol/L (ref 135–145)

## 2016-01-03 LAB — ECHOCARDIOGRAM COMPLETE
CHL CUP DOP CALC LVOT VTI: 14.1 cm
CHL CUP MV DEC (S): 183
E/e' ratio: 7.79
EWDT: 183 ms
FS: 23 % — AB (ref 28–44)
HEIGHTINCHES: 72 in
IV/PV OW: 1.2
LA ID, A-P, ES: 35 mm
LA diam end sys: 35 mm
LADIAMINDEX: 1.77 cm/m2
LAVOL: 35.1 mL
LAVOLA4C: 28.3 mL
LAVOLIN: 17.7 mL/m2
LDCA: 2.84 cm2
LV E/e'average: 7.79
LV PW d: 10.6 mm — AB (ref 0.6–1.1)
LV TDI E'LATERAL: 6.53
LV TDI E'MEDIAL: 5.87
LVEEMED: 7.79
LVELAT: 6.53 cm/s
LVOT SV: 40 mL
LVOT diameter: 19 mm
LVOT peak vel: 64.3 cm/s
MV pk E vel: 50.9 m/s
MVPKAVEL: 75.7 m/s
Weight: 2694.9 oz

## 2016-01-03 LAB — IRON AND TIBC
Iron: 112 ug/dL (ref 45–182)
SATURATION RATIOS: 47 % — AB (ref 17.9–39.5)
TIBC: 238 ug/dL — ABNORMAL LOW (ref 250–450)
UIBC: 126 ug/dL

## 2016-01-03 LAB — HEMOGLOBIN AND HEMATOCRIT, BLOOD
HCT: 25.5 % — ABNORMAL LOW (ref 39.0–52.0)
HCT: 26.8 % — ABNORMAL LOW (ref 39.0–52.0)
HEMATOCRIT: 26.7 % — AB (ref 39.0–52.0)
Hemoglobin: 7.8 g/dL — ABNORMAL LOW (ref 13.0–17.0)
Hemoglobin: 8.2 g/dL — ABNORMAL LOW (ref 13.0–17.0)
Hemoglobin: 8.2 g/dL — ABNORMAL LOW (ref 13.0–17.0)

## 2016-01-03 LAB — GLUCOSE, CAPILLARY
GLUCOSE-CAPILLARY: 139 mg/dL — AB (ref 65–99)
Glucose-Capillary: 111 mg/dL — ABNORMAL HIGH (ref 65–99)
Glucose-Capillary: 113 mg/dL — ABNORMAL HIGH (ref 65–99)
Glucose-Capillary: 129 mg/dL — ABNORMAL HIGH (ref 65–99)
Glucose-Capillary: 130 mg/dL — ABNORMAL HIGH (ref 65–99)

## 2016-01-03 LAB — CBC
HCT: 25.6 % — ABNORMAL LOW (ref 39.0–52.0)
HEMOGLOBIN: 7.8 g/dL — AB (ref 13.0–17.0)
MCH: 20.4 pg — AB (ref 26.0–34.0)
MCHC: 30.5 g/dL (ref 30.0–36.0)
MCV: 66.8 fL — AB (ref 78.0–100.0)
PLATELETS: 355 10*3/uL (ref 150–400)
RBC: 3.83 MIL/uL — AB (ref 4.22–5.81)
RDW: 20.6 % — ABNORMAL HIGH (ref 11.5–15.5)
WBC: 8.3 10*3/uL (ref 4.0–10.5)

## 2016-01-03 LAB — PROTIME-INR
INR: 2.01 — AB (ref 0.00–1.49)
PROTHROMBIN TIME: 22 s — AB (ref 11.6–15.2)

## 2016-01-03 LAB — URINE MICROSCOPIC-ADD ON: Squamous Epithelial / LPF: NONE SEEN

## 2016-01-03 LAB — BRAIN NATRIURETIC PEPTIDE: B NATRIURETIC PEPTIDE 5: 236.8 pg/mL — AB (ref 0.0–100.0)

## 2016-01-03 LAB — VITAMIN B12: Vitamin B-12: 239 pg/mL (ref 180–914)

## 2016-01-03 LAB — RETICULOCYTES
RBC.: 3.74 MIL/uL — AB (ref 4.22–5.81)
Retic Count, Absolute: 15 10*3/uL — ABNORMAL LOW (ref 19.0–186.0)
Retic Ct Pct: 0.4 % (ref 0.4–3.1)

## 2016-01-03 LAB — MAGNESIUM: Magnesium: 1.6 mg/dL — ABNORMAL LOW (ref 1.7–2.4)

## 2016-01-03 LAB — FERRITIN: FERRITIN: 54 ng/mL (ref 24–336)

## 2016-01-03 LAB — APTT: APTT: 46 s — AB (ref 24–37)

## 2016-01-03 LAB — FOLATE: FOLATE: 43.2 ng/mL (ref >5.9)

## 2016-01-03 LAB — MRSA PCR SCREENING: MRSA by PCR: NEGATIVE

## 2016-01-03 MED ORDER — ATORVASTATIN CALCIUM 40 MG PO TABS
40.0000 mg | ORAL_TABLET | Freq: Every day | ORAL | Status: DC
  Administered 2016-01-03 – 2016-01-04 (×3): 40 mg via ORAL
  Filled 2016-01-03 (×3): qty 1

## 2016-01-03 MED ORDER — SODIUM CHLORIDE 0.9 % IV SOLN
INTRAVENOUS | Status: DC
Start: 2016-01-03 — End: 2016-01-04
  Administered 2016-01-03 – 2016-01-04 (×2): via INTRAVENOUS

## 2016-01-03 MED ORDER — CETYLPYRIDINIUM CHLORIDE 0.05 % MT LIQD
7.0000 mL | Freq: Two times a day (BID) | OROMUCOSAL | Status: DC
  Administered 2016-01-03 – 2016-01-05 (×4): 7 mL via OROMUCOSAL

## 2016-01-03 MED ORDER — MUPIROCIN 2 % EX OINT: 1.0000 "application " | TOPICAL_OINTMENT | Freq: Three times a day (TID) | CUTANEOUS | Status: DC | PRN

## 2016-01-03 MED ORDER — ACETAMINOPHEN 500 MG PO TABS: 500.0000 mg | ORAL_TABLET | Freq: Four times a day (QID) | ORAL | Status: DC | PRN

## 2016-01-03 MED ORDER — TAMSULOSIN HCL 0.4 MG PO CAPS
0.4000 mg | ORAL_CAPSULE | Freq: Every day | ORAL | Status: DC
  Administered 2016-01-03 – 2016-01-05 (×3): 0.4 mg via ORAL
  Filled 2016-01-03 (×3): qty 1

## 2016-01-03 MED ORDER — VITAMIN K1 10 MG/ML IJ SOLN
10.0000 mg | Freq: Once | INTRAVENOUS | Status: AC
  Administered 2016-01-03: 10 mg via INTRAVENOUS
  Filled 2016-01-03: qty 1

## 2016-01-03 MED ORDER — HALOPERIDOL LACTATE 5 MG/ML IJ SOLN: 1.0000 mg | Freq: Four times a day (QID) | INTRAMUSCULAR | Status: DC | PRN

## 2016-01-03 MED ORDER — ACETAMINOPHEN-CODEINE #3 300-30 MG PO TABS: 1.0000 | ORAL_TABLET | Freq: Three times a day (TID) | ORAL | Status: DC | PRN

## 2016-01-03 MED ORDER — ONDANSETRON HCL 4 MG PO TABS: 4.0000 mg | ORAL_TABLET | Freq: Four times a day (QID) | ORAL | Status: DC | PRN

## 2016-01-03 MED ORDER — MAGNESIUM SULFATE 2 GM/50ML IV SOLN
2.0000 g | Freq: Once | INTRAVENOUS | Status: AC
  Administered 2016-01-03: 2 g via INTRAVENOUS
  Filled 2016-01-03: qty 50

## 2016-01-03 MED ORDER — PANTOPRAZOLE SODIUM 40 MG IV SOLR
40.0000 mg | Freq: Two times a day (BID) | INTRAVENOUS | Status: DC
  Administered 2016-01-03 – 2016-01-05 (×5): 40 mg via INTRAVENOUS
  Filled 2016-01-03 (×5): qty 40

## 2016-01-03 MED ORDER — SODIUM CHLORIDE 0.9 % IV BOLUS (SEPSIS)
250.0000 mL | Freq: Once | INTRAVENOUS | Status: AC
  Administered 2016-01-03: 250 mL via INTRAVENOUS

## 2016-01-03 MED ORDER — DULOXETINE HCL 60 MG PO CPEP
60.0000 mg | ORAL_CAPSULE | Freq: Every day | ORAL | Status: DC
  Administered 2016-01-03 – 2016-01-05 (×3): 60 mg via ORAL
  Filled 2016-01-03 (×3): qty 1

## 2016-01-03 MED ORDER — METOPROLOL TARTRATE 25 MG PO TABS
25.0000 mg | ORAL_TABLET | Freq: Two times a day (BID) | ORAL | Status: DC
  Administered 2016-01-03 – 2016-01-05 (×5): 25 mg via ORAL
  Filled 2016-01-03 (×5): qty 1

## 2016-01-03 MED ORDER — INSULIN ASPART 100 UNIT/ML ~~LOC~~ SOLN
0.0000 [IU] | Freq: Three times a day (TID) | SUBCUTANEOUS | Status: DC
  Administered 2016-01-03: 1 [IU] via SUBCUTANEOUS
  Administered 2016-01-04: 2 [IU] via SUBCUTANEOUS
  Administered 2016-01-05: 1 [IU] via SUBCUTANEOUS

## 2016-01-03 MED ORDER — ONDANSETRON HCL 4 MG/2ML IJ SOLN: 4.0000 mg | Freq: Four times a day (QID) | INTRAMUSCULAR | Status: DC | PRN

## 2016-01-03 MED ORDER — LEVOTHYROXINE SODIUM 25 MCG PO TABS
25.0000 ug | ORAL_TABLET | Freq: Every day | ORAL | Status: DC
  Administered 2016-01-03 – 2016-01-05 (×3): 25 ug via ORAL
  Filled 2016-01-03 (×3): qty 1

## 2016-01-03 MED ORDER — HYDROXYZINE HCL 25 MG PO TABS
25.0000 mg | ORAL_TABLET | Freq: Three times a day (TID) | ORAL | Status: DC | PRN
  Administered 2016-01-03: 25 mg via ORAL
  Filled 2016-01-03: qty 1

## 2016-01-03 MED ORDER — FOLIC ACID 1 MG PO TABS
1.0000 mg | ORAL_TABLET | Freq: Every day | ORAL | Status: DC
  Administered 2016-01-03 – 2016-01-05 (×3): 1 mg via ORAL
  Filled 2016-01-03 (×3): qty 1

## 2016-01-03 MED ORDER — POTASSIUM CHLORIDE CRYS ER 20 MEQ PO TBCR
40.0000 meq | EXTENDED_RELEASE_TABLET | Freq: Once | ORAL | Status: AC
  Administered 2016-01-03: 40 meq via ORAL
  Filled 2016-01-03: qty 2

## 2016-01-03 MED ORDER — IOPAMIDOL (ISOVUE-300) INJECTION 61%
100.0000 mL | Freq: Once | INTRAVENOUS | Status: AC | PRN
  Administered 2016-01-03: 100 mL via INTRAVENOUS

## 2016-01-03 MED ORDER — INSULIN ASPART 100 UNIT/ML ~~LOC~~ SOLN: 0.0000 [IU] | Freq: Every day | SUBCUTANEOUS | Status: DC

## 2016-01-03 MED ORDER — NYSTATIN-TRIAMCINOLONE 100000-0.1 UNIT/GM-% EX CREA
1.0000 "application " | TOPICAL_CREAM | CUTANEOUS | Status: DC | PRN
  Filled 2016-01-03: qty 15

## 2016-01-03 MED ORDER — POTASSIUM CHLORIDE 20 MEQ/15ML (10%) PO SOLN
40.0000 meq | Freq: Once | ORAL | Status: AC
  Administered 2016-01-03: 40 meq via ORAL
  Filled 2016-01-03: qty 30

## 2016-01-03 MED ORDER — QUETIAPINE FUMARATE 25 MG PO TABS
50.0000 mg | ORAL_TABLET | Freq: Two times a day (BID) | ORAL | Status: DC
  Administered 2016-01-03 – 2016-01-05 (×6): 50 mg via ORAL
  Filled 2016-01-03 (×3): qty 1
  Filled 2016-01-03 (×4): qty 2
  Filled 2016-01-03: qty 1
  Filled 2016-01-03: qty 2
  Filled 2016-01-03: qty 1

## 2016-01-03 MED ORDER — SODIUM CHLORIDE 0.9% FLUSH: 3.0000 mL | Freq: Two times a day (BID) | INTRAVENOUS | Status: DC

## 2016-01-03 NOTE — Progress Notes (Signed)
  Echocardiogram 2D Echocardiogram has been performed.  Nolon RodBrown, Tony 01/03/2016, 3:43 PM

## 2016-01-03 NOTE — Progress Notes (Addendum)
PROGRESS NOTE                                                                                                                                                                                                             Roy Hickman Demographics:    Roy Hickman, is a 75 y.o. male, DOB - [birth year], WUJ:[id number]  Admit date - 01/02/2016   Admitting Physician Lorretta Harp, MD  Outpatient Primary MD for the Roy Hickman is Remus Loffler, PA  LOS - 1  Chief Complaint  Roy Hickman presents with  . Failure To Thrive       Brief Narrative    Roy Hickman is a 75 y.o. male with medical history significant of hypertension, hyperlipidemia, diabetes mellitus, GERD, hypothyroidism, depression, dementia, CAD, s/p CABG 2007, DVT on Coumadin, BPH, CHF, who presents with failure to thrive.  Roy Hickman has dementia and is unable to provide accurate medical history, therefore, most of the history is obtained by discussing the case with ED physician, per family, and with the nursing staff.   Per his sister, pt was noted to have failure to thrive recently. pt has "not been eating for 3 days".no specific complaints or pain. Roy Hickman may have mild dry cough per sister, but does not seem to have chest pain, fever or chills. No nausea, vomiting or diarrhea noted. Roy Hickman moves all extremities. No black stool or bloody stool, no hematuria. He was found to have hemoglobin drop from 9.6 on 06/21/14-->5.4 on admission. Roy Hickman never had EGD or colonoscopy in the past.  ED Course: pt was found to have positive FOBT, INR 6.45, WBC 8.1, temperature normal, no tachycardia, potassium 3.0, creatinine normal, negative urinalysis, negative chest x-ray for acute abnormalities, negative CT head for acute intracranial abnormalities. Roy Hickman is admitted to telemetry bed as inpt. Dr. Levin Erp was consulted.   Subjective:    Roy Hickman today has, No headache, No chest pain, No  abdominal pain - No Nausea, No new weakness tingling or numbness, No Cough - SOB.     Assessment  & Plan :     1.Acute on chronic GI bleeding associated anemia. Source unclear, anemia appears microcytic and likely has some element of chronic occult blood loss as well, has been transfused 2 units on the day of admission with stable H&H, H&H will be continued to monitor. INR has  been reversed with vitamin K and stable. On IV PPI, bowel rest and GI to evaluate. Dr. Bosie ClosSchooler will be seeing the Roy Hickman.  2. Microcytic anemia. Plan as above, has been transfused 2 units on admission, anemia panel pending likely will be iron deficient as I suspect some element of chronic occult blood loss as well in the light of being on Coumadin long-term.  3. Hypokalemia and hypomagnesemia. Both replaced.  4. Hypothyroidism. continue home dose Synthroid.  5. GERD. On PPI  6. BPH. On Flomax.  7. Baseline dementia. At risk for delirium, continue home dose Seroquel. As needed IV Haldol.  8. History of DVT in 2013. Repeat venous ultrasound this admission shows chronic clots but nothing acute, we'll defer long-term anti-coordination to PCP.  9. Dyslipidemia. On statin.  10. CAD status post CABG. No acute issues.  11. Chronic systolic CHF last EF 45% 1-1/2 years ago, compensated.  12. Asypmtomatic 15 beat run of NSVT - lytes replaced, added B blocker, check TTE & EKG.  13. Diet-controlled DM type II. On sliding scale for now.  Lab Results  Component Value Date   HGBA1C 7.1* 06/10/2014   CBG (last 3)   Recent Labs  01/03/16 0048 01/03/16 0736  GLUCAP 130* 113*      Family Communication  :  None present  Code Status :  Full  Diet : NPO  Disposition Plan  :  Stay inpatient  Consults  :  GI Schooler  Procedures  :    Bilateral lower extremity venous duplex completed.  Preliminary report: Bilateral: No evidence of acute DVT, superficial thrombosis, or Baker's Cyst. Non occlusive chronic  appearing thrombosis noted in the CFV. Occlusive chronic thrombus in the FV with collateral venous flow noted.  CT Head - Non acute    DVT Prophylaxis  :    SCDs    Lab Results  Component Value Date   PLT 355 01/03/2016    Inpatient Medications  Scheduled Meds: . antiseptic oral rinse  7 mL Mouth Rinse BID  . atorvastatin  40 mg Oral QHS  . DULoxetine  60 mg Oral Daily  . folic acid  1 mg Oral Daily  . insulin aspart  0-5 Units Subcutaneous QHS  . insulin aspart  0-9 Units Subcutaneous TID WC  . levothyroxine  25 mcg Oral QAC breakfast  . pantoprazole (PROTONIX) IV  40 mg Intravenous Q12H  . QUEtiapine  50 mg Oral BID  . tamsulosin  0.4 mg Oral Daily   Continuous Infusions: . sodium chloride     PRN Meds:.acetaminophen, acetaminophen-codeine, hydrOXYzine, mupirocin ointment, nystatin-triamcinolone, [DISCONTINUED] ondansetron **OR** ondansetron (ZOFRAN) IV  Antibiotics  :    Anti-infectives    None         Objective:   Filed Vitals:   01/03/16 0134 01/03/16 0306 01/03/16 0336 01/03/16 0535  BP: 156/83 145/79 141/62 152/74  Pulse: 81 84 71 80  Temp: 97.6 F (36.4 C) 97.5 F (36.4 C) 97.4 F (36.3 C) 97.3 F (36.3 C)  TempSrc: Oral Oral Axillary Axillary  Resp: 18 16 16 18   Height:      Weight:      SpO2: 100% 100% 100% 100%    Wt Readings from Last 3 Encounters:  01/03/16 76.4 kg (168 lb 6.9 oz)  08/02/14 83.462 kg (184 lb)  07/01/14 78.654 kg (173 lb 6.4 oz)     Intake/Output Summary (Last 24 hours) at 01/03/16 1018 Last data filed at 01/03/16 0535  Gross per 24 hour  Intake 1509.25 ml  Output    175 ml  Net 1334.25 ml     Physical Exam  Awake Alert, Oriented X 1, No new F.N deficits, Normal affect [place].AT,PERRAL Supple Neck,No JVD, No cervical lymphadenopathy appriciated.  Symmetrical Chest wall movement, Good air movement bilaterally, CTAB RRR,No Gallops,Rubs or new Murmurs, No Parasternal Heave +ve B.Sounds, Abd Soft, No tenderness, No  organomegaly appriciated, No rebound - guarding or rigidity. No Cyanosis, Clubbing or edema, No new Rash or bruise      Data Review:    CBC  Recent Labs Lab 01/02/16 2059 01/03/16 0821  WBC 8.1 8.3  HGB 5.4* 7.8*  HCT 19.4* 25.6*  PLT 366 355  MCV 63.8* 66.8*  MCH 17.8* 20.4*  MCHC 27.8* 30.5  RDW 19.3* 20.6*  LYMPHSABS 1.1  --   MONOABS 0.8  --   EOSABS 0.1  --   BASOSABS 0.0  --     Chemistries   Recent Labs Lab 01/02/16 2059 01/03/16 0821  NA 138 140  K 3.0* 3.5  CL 107 109  CO2 25 27  GLUCOSE 122* 114*  BUN 13 12  CREATININE 0.77 0.73  CALCIUM 7.9* 7.9*  MG  --  1.6*  AST 14*  --   ALT 7*  --   ALKPHOS 73  --   BILITOT 0.6  --    ------------------------------------------------------------------------------------------------------------------ No results for input(s): CHOL, HDL, LDLCALC, TRIG, CHOLHDL, LDLDIRECT in the last 72 hours.  Lab Results  Component Value Date   HGBA1C 7.1* 06/10/2014   ------------------------------------------------------------------------------------------------------------------ No results for input(s): TSH, T4TOTAL, T3FREE, THYROIDAB in the last 72 hours.  Invalid input(s): FREET3 ------------------------------------------------------------------------------------------------------------------ No results for input(s): VITAMINB12, FOLATE, FERRITIN, TIBC, IRON, RETICCTPCT in the last 72 hours.  Coagulation profile  Recent Labs Lab 01/02/16 2059 01/03/16 0821  INR 6.45* 2.01*    No results for input(s): DDIMER in the last 72 hours.  Cardiac Enzymes No results for input(s): CKMB, TROPONINI, MYOGLOBIN in the last 168 hours.  Invalid input(s): CK ------------------------------------------------------------------------------------------------------------------    Component Value Date/Time   BNP 236.8* 01/03/2016 1610    Micro Results Recent Results (from the past 240 hour(s))  MRSA PCR Screening     Status:  None   Collection Time: 01/03/16 12:15 AM  Result Value Ref Range Status   MRSA by PCR NEGATIVE NEGATIVE Final    Comment:        The GeneXpert MRSA Assay (FDA approved for NASAL specimens only), is one component of a comprehensive MRSA colonization surveillance program. It is not intended to diagnose MRSA infection nor to guide or monitor treatment for MRSA infections.     Radiology Reports Dg Chest 2 View  01/02/2016  CLINICAL DATA:  75 year old male with failure to thrive EXAM: CHEST  2 VIEW COMPARISON:  Chest radiograph dated 06/10/2014 FINDINGS: Two views of the chest demonstrate emphysematous changes of the lungs. There is no focal consolidation, pleural effusion, or pneumothorax. The cardiac silhouette is within normal limits. Median sternotomy wires and CABG vascular clips noted. The bones are osteopenic. No acute fracture. IMPRESSION: No active cardiopulmonary disease. Electronically Signed   By: Elgie Collard M.D.   On: 01/02/2016 21:19   Ct Head Wo Contrast  01/02/2016  CLINICAL DATA:  75 year old male with altered mental status. EXAM: CT HEAD WITHOUT CONTRAST TECHNIQUE: Contiguous axial images were obtained from the base of the skull through the vertex without intravenous contrast. COMPARISON:  Head CT dated 06/11/2014 FINDINGS: There is moderate age-related atrophy  and chronic microvascular ischemic changes. No acute intracranial hemorrhage. No mass effect or midline shift. The visualized paranasal sinuses and mastoid air cells are clear. The calvarium is intact. IMPRESSION: No acute intracranial hemorrhage. Age-related atrophy and chronic microvascular ischemic disease. If symptoms persist and there are no contraindications, MRI may provide better evaluation if clinically indicated. Electronically Signed   By: Elgie CollardArash  Radparvar M.D.   On: 01/02/2016 22:59    Time Spent in minutes  30   Blaiden Werth K M.D on 01/03/2016 at 10:18 AM  Between 7am to 7pm - Pager -  [phone]  After 7pm go to [email or web address] - password [email or web address]  [name] -  Office  [phone]

## 2016-01-03 NOTE — Progress Notes (Signed)
Pt had 15 beats of Vtach while sleeping. Pt awoken and he denies any chest pain or dizziness. BP 131/61, HR 79. MD made aware. Will continue to monitor.  Arta BruceDeutsch, Evolette Pendell [facility] 01/03/2016 12:01 PM

## 2016-01-03 NOTE — Progress Notes (Signed)
OT Cancellation Note  Patient Details Name: Roy Hickman H Estabrooks MRN: [id number] DOB: 05/04/1941   Cancelled Treatment:    Reason Eval/Treat Not Completed: Medical issues which prohibited therapy -- Patient with INR 6.45 and Hgb 5.4, plan per chart review is for scope today. OT will hold evaluation today and check on patient at a later time.  Hazelee Harbold A 01/03/2016, 6:52 AM

## 2016-01-03 NOTE — Progress Notes (Signed)
Pt had only voided about 30cc of tea colored urine collected by a external condom catheter. Bladder scan performed and showed about 340cc of urine. Pt asked if he needed to void and he said he felt like he could. Pt did void about 170cc of tea colored urine, for a total of 200cc since 7am today. Bladder scan performed after voiding and post void residual was about 170cc of urine. MD made aware and will continue to monitor urine output.   Arta BruceDeutsch, Sabrine Patchen [facility] 01/03/2016 3:50 PM

## 2016-01-03 NOTE — Progress Notes (Signed)
VASCULAR LAB PRELIMINARY  PRELIMINARY  PRELIMINARY  PRELIMINARY  Bilateral lower extremity venous duplex  completed.    Preliminary report:  Bilateral:  No evidence of acute DVT, superficial thrombosis, or Baker's Cyst.  Non occlusive chronic appearing thrombosis noted in the CFV.  Occlusive chronic thrombus in the FV with collateral venous flow noted.   Annye Forrey, RVT 01/03/2016, 10:00 AM

## 2016-01-03 NOTE — Progress Notes (Signed)
PT Cancellation Note  Patient Details Name: Roy Hickman MRN: [id number] DOB: 1940/11/11   Cancelled Treatment:     PT order received but eval deferred this date.  Pt with Hgb of 5.4 and INR of 6.45.  Will follow.   Julionna Marczak 01/03/2016, 7:20 AM

## 2016-01-03 NOTE — Consult Note (Signed)
Referring Provider: Dr. Thedore MinsSingh Primary Care Physician:  Remus LofflerJones, Angel S, PA Primary Gastroenterologist:  Gentry FitzUnassigned  Reason for Consultation:  Anemia  HPI: Roy Hickman is a 75 y.o. male with multiple medical problems as stated below being seen for severe anemia with a Hgb 5.8 and heme positive stool. On chronic Coumadin for DVT and INR elevated at 6.45. No report of melena or hematochezia. Patient has severe dementia and is unable to give me any history. [place] and thinks [place] "[place]." No family at bedside but chart history reports that patient has not been eating for several days without any report of abdominal pain. No history of previous EGD or colonoscopy. During my evaluation patient is sleeping and difficult to arouse and is only able to tell me he has "stomach issues" for why he is in the hospital.   Past Medical History  Diagnosis Date  . Hypertension   . Diabetes mellitus   . MI (mitral incompetence)   . Myocardial infarction [facility](HCC) 2007    s/p CABG   . Coronary artery disease   . GERD (gastroesophageal reflux disease)   . Hyperlipidemia   . Left leg DVT (HCC)   . Dementia   . Chronic combined systolic and diastolic congestive heart failure [facility](HCC)     Past Surgical History  Procedure Laterality Date  . Elbow surgery      left  . Laminectomy    . Cervical spine surgery    . Coronary artery bypass graft  2007    LIMA to his LAD, and vein to a diagonal branch and PDA. His EF at that time was 35% to 40%.   . Back surgery    . Posterior cervical fusion/foraminotomy  07/21/2011    Procedure: POSTERIOR CERVICAL FUSION/FORAMINOTOMY LEVEL 5;  Surgeon: Karn CassisErnesto M Botero, MD;  Location: [facility];  Service: Neurosurgery;  Laterality: N/A;  Cervical three to Thoracic one Laminectomy, posterolateral arthrodesis, with Lateral Mass screws    Prior to Admission medications   Medication Sig Start Date End Date Taking? Authorizing Provider   acetaminophen (TYLENOL) 500 MG tablet Take 500 mg by mouth every 6 (six) hours as needed for moderate pain or headache.   Yes Historical Provider, MD  acetaminophen-codeine (TYLENOL #3) 300-30 MG per tablet Take 1 tablet by mouth every 8 (eight) hours as needed for moderate pain.  06/24/14  Yes Historical Provider, MD  atorvastatin (LIPITOR) 40 MG tablet Take 40 mg by mouth at bedtime.    Yes Historical Provider, MD  DULoxetine (CYMBALTA) 60 MG capsule Take 60 mg by mouth daily.   Yes Historical Provider, MD  folic acid ([name]) 1 MG tablet Take 1 mg by mouth daily.    Yes Historical Provider, MD  hydrOXYzine ([name]) 25 MG tablet Take 25 mg by mouth every 8 (eight) hours as needed for anxiety or itching.   Yes Historical Provider, MD  levothyroxine (SYNTHROID, LEVOTHROID) 25 MCG tablet Take 25 mcg by mouth daily before breakfast.   Yes Historical Provider, MD  mupirocin ointment (BACTROBAN) 2 % Apply 1 application topically 3 (three) times daily as needed (but sores).    Yes Historical Provider, MD  nystatin-triamcinolone (MYCOLOG II) cream Apply 1 application topically as needed (skin irritation).  12/18/12  Yes Historical Provider, MD  omeprazole (PRILOSEC) 20 MG capsule Take 20 mg by mouth daily.   Yes Historical Provider, MD  QUEtiapine (SEROQUEL) 50 MG tablet Take 50 mg by mouth  2 (two) times daily.  01/24/13  Yes Historical Provider, MD  tamsulosin (FLOMAX) 0.4 MG CAPS capsule Take 1 capsule (0.4 mg total) by mouth daily. 07/02/14  Yes Jodelle GrossKathryn M Lawrence, NP  warfarin (COUMADIN) 5 MG tablet Take 5 mg by mouth daily.    Yes Historical Provider, MD    Scheduled Meds: . antiseptic oral rinse  7 mL Mouth Rinse BID  . atorvastatin  40 mg Oral QHS  . DULoxetine  60 mg Oral Daily  . folic acid  1 mg Oral Daily  . insulin aspart  0-5 Units Subcutaneous QHS  . insulin aspart  0-9 Units Subcutaneous TID WC  . levothyroxine  25 mcg Oral QAC breakfast  . magnesium sulfate 1 - 4 g bolus  IVPB  2 g Intravenous Once  . pantoprazole (PROTONIX) IV  40 mg Intravenous Q12H  . potassium chloride  40 mEq Oral Once  . QUEtiapine  50 mg Oral BID  . tamsulosin  0.4 mg Oral Daily   Continuous Infusions: . sodium chloride 50 mL/hr at 01/03/16 1020   PRN Meds:.acetaminophen, acetaminophen-codeine, haloperidol lactate, mupirocin ointment, nystatin-triamcinolone, [DISCONTINUED] ondansetron **OR** ondansetron (ZOFRAN) IV  Allergies as of 01/02/2016  . (No Known Allergies)    Family History  Problem Relation Age of Onset  . Heart failure Father   . Heart failure Sister     Social History   Social History  . Marital Status: Widowed    Spouse Name: N/A  . Number of Children: N/A  . Years of Education: N/A   Occupational History  . Not on file.   Social History Main Topics  . Smoking status: Former Smoker -- 2.00 packs/day for 25 years    Types: Cigarettes    Start date: 04/01/1952    Quit date: 02/27/1987  . Smokeless tobacco: Never Used  . Alcohol Use: No     Comment: quit drinking in 1984  . Drug Use: No  . Sexual Activity: No   Other Topics Concern  . Not on file   Social History Narrative   [facility] Was living in an apt by himself before [facility]. Has 5 sisters, no brothers. [phone]. Tomma RakersSherley Tucker, Steward DroneBrenda Sizemore.     Review of Systems: All negative except as stated above in HPI.  Physical Exam: Vital signs: Filed Vitals:   01/03/16 0336 01/03/16 0535  BP: 141/62 152/74  Pulse: 71 80  Temp: 97.4 F (36.3 C) 97.3 F (36.3 C)  Resp: 16 18   Last BM Date:  (pta ) General:   Somnolent, demented, elderly, frail, thin HEENT: anicteric sclera, no teeth Neck: supple, nontender Lungs:  Coarse breath sounds Heart:  Regular rate and rhythm; no murmurs, clicks, rubs,  or gallops. Abdomen: diffuse tenderness with minimal guarding, soft, nondistended, +BS  Rectal:  Deferred Ext: no  edema  GI:  Lab Results:  Recent Labs  01/02/16 2059 01/03/16 0821  WBC 8.1 8.3  HGB 5.4* 7.8*  HCT 19.4* 25.6*  PLT 366 355   BMET  Recent Labs  01/02/16 2059 01/03/16 0821  NA 138 140  K 3.0* 3.5  CL 107 109  CO2 25 27  GLUCOSE 122* 114*  BUN 13 12  CREATININE 0.77 0.73  CALCIUM 7.9* 7.9*   LFT  Recent Labs  01/02/16 2059  PROT 6.0*  ALBUMIN 2.4*  AST 14*  ALT 7*  ALKPHOS 73  BILITOT 0.6  PT/INR  Recent Labs  01/02/16 2059 01/03/16 0821  LABPROT 52.8* 22.0*  INR 6.45* 2.01*     Studies/Results: Dg Chest 2 View  01/02/2016  CLINICAL DATA:  75 year old male with failure to thrive EXAM: CHEST  2 VIEW COMPARISON:  Chest radiograph dated 06/10/2014 FINDINGS: Two views of the chest demonstrate emphysematous changes of the lungs. There is no focal consolidation, pleural effusion, or pneumothorax. The cardiac silhouette is within normal limits. Median sternotomy wires and CABG vascular clips noted. The bones are osteopenic. No acute fracture. IMPRESSION: No active cardiopulmonary disease. Electronically Signed   By: Elgie Collard M.D.   On: 01/02/2016 21:19   Ct Head Wo Contrast  01/02/2016  CLINICAL DATA:  75 year old male with altered mental status. EXAM: CT HEAD WITHOUT CONTRAST TECHNIQUE: Contiguous axial images were obtained from the base of the skull through the vertex without intravenous contrast. COMPARISON:  Head CT dated 06/11/2014 FINDINGS: There is moderate age-related atrophy and chronic microvascular ischemic changes. No acute intracranial hemorrhage. No mass effect or midline shift. The visualized paranasal sinuses and mastoid air cells are clear. The calvarium is intact. IMPRESSION: No acute intracranial hemorrhage. Age-related atrophy and chronic microvascular ischemic disease. If symptoms persist and there are no contraindications, MRI may provide better evaluation if clinically indicated. Electronically Signed   By: Elgie Collard M.D.   On:  01/02/2016 22:59    Impression/Plan: 75 yo with severe anemia and heme positive stool in the setting of a supratherapeutic INR of 6.45 on Coumadin. S/P 2 U PRBCs to 7.8 from 5.4. INR corrected to 2.0. Due to his multiple medical problems and severe dementia I would favor a conservative management for his anemia especially with the likelihood that the elevated INR contributed to it. DDx malignancy vs. AVMs. Doubt peptic ulcer source especially with lack of overt bleeding and normal hemodynamics with the low Hgb. Would do an abd/pelvis CT to see if any mass lesions. He does not appear that he will be able to drink a colon prep if a colonoscopy was planned. If CT is negative can consider an EGD to look for PUD prior to restarting anticoagulation but he would need Propofol sedation for an EGD. Will start with an abd/pelvis CT and f/u again tomorrow.    LOS: 1 day   Emmajane Altamura C.  01/03/2016, 11:14 AM  Pager [phone]  If no answer or after 5 PM call [phone]

## 2016-01-04 DIAGNOSIS — Z515 Encounter for palliative care: Secondary | ICD-10-CM | POA: Insufficient documentation

## 2016-01-04 DIAGNOSIS — I472 Ventricular tachycardia: Secondary | ICD-10-CM

## 2016-01-04 DIAGNOSIS — Z7901 Long term (current) use of anticoagulants: Secondary | ICD-10-CM

## 2016-01-04 DIAGNOSIS — Z7189 Other specified counseling: Secondary | ICD-10-CM

## 2016-01-04 LAB — HEMOGLOBIN AND HEMATOCRIT, BLOOD
HCT: 25.3 % — ABNORMAL LOW (ref 39.0–52.0)
HCT: 27.8 % — ABNORMAL LOW (ref 39.0–52.0)
HEMATOCRIT: 26.8 % — AB (ref 39.0–52.0)
HEMOGLOBIN: 7.9 g/dL — AB (ref 13.0–17.0)
HEMOGLOBIN: 8.4 g/dL — AB (ref 13.0–17.0)
Hemoglobin: 8.2 g/dL — ABNORMAL LOW (ref 13.0–17.0)

## 2016-01-04 LAB — MAGNESIUM: MAGNESIUM: 2.2 mg/dL (ref 1.7–2.4)

## 2016-01-04 LAB — BASIC METABOLIC PANEL
Anion gap: 5 (ref 5–15)
BUN: 8 mg/dL (ref 6–20)
CHLORIDE: 110 mmol/L (ref 101–111)
CO2: 23 mmol/L (ref 22–32)
Calcium: 7.7 mg/dL — ABNORMAL LOW (ref 8.9–10.3)
Creatinine, Ser: 0.7 mg/dL (ref 0.61–1.24)
Glucose, Bld: 127 mg/dL — ABNORMAL HIGH (ref 65–99)
POTASSIUM: 4 mmol/L (ref 3.5–5.1)
SODIUM: 138 mmol/L (ref 135–145)

## 2016-01-04 LAB — GLUCOSE, CAPILLARY
GLUCOSE-CAPILLARY: 130 mg/dL — AB (ref 65–99)
Glucose-Capillary: 165 mg/dL — ABNORMAL HIGH (ref 65–99)
Glucose-Capillary: 115 mg/dL — ABNORMAL HIGH (ref 65–99)

## 2016-01-04 MED ORDER — VITAMINS A & D EX OINT
TOPICAL_OINTMENT | CUTANEOUS | Status: AC
  Administered 2016-01-04: 22:00:00
  Filled 2016-01-04: qty 5

## 2016-01-04 MED ORDER — SODIUM CHLORIDE 0.9 % IV SOLN
INTRAVENOUS | Status: AC
  Administered 2016-01-05: 02:00:00 via INTRAVENOUS

## 2016-01-04 MED ORDER — LISINOPRIL 2.5 MG PO TABS
2.5000 mg | ORAL_TABLET | Freq: Every day | ORAL | Status: DC
  Administered 2016-01-04 – 2016-01-05 (×2): 2.5 mg via ORAL
  Filled 2016-01-04 (×2): qty 1

## 2016-01-04 MED ORDER — VITAMINS A & D EX OINT
TOPICAL_OINTMENT | CUTANEOUS | Status: AC
  Administered 2016-01-04: 04:00:00
  Filled 2016-01-04: qty 5

## 2016-01-04 NOTE — Progress Notes (Addendum)
D/W Dr. Thedore MinsSingh. Initially consulted for newly decreased LVEF from 35% to 20%. The patient has had NSVT as well, but in the setting of severe anemia d/t warfarin coagulopathy and GIB. He has advanced dementia. Prior CABG in 2007. Was on warfarin for DVT in the past. Question as to further cardiac work-up. At this point, with acute GIB he is not a cath candidate - given his advanced dementia, he may not be a candidate in the future either. I agree with discontinuing warfarin - likely permanently, as the risk/benefit ratio is not favorable for ongoing treatment of remote DVT (2013). Would manage arrhythmias medically. After bleeding has resolved, consider restarting low dose ASA 81 mg daily. He is DNR, which seems appropriate.  Feel free to contact us with further questions.  Roy NoseKenneth C. Loralei Radcliffe, MD, [facility] Attending Cardiologist [facility]

## 2016-01-04 NOTE — Progress Notes (Signed)
Report received from J. Deutsch,RN. No change in assessment. Roy Hickman 

## 2016-01-04 NOTE — NC FL2 (Signed)
[place] MEDICAID FL2 LEVEL OF CARE SCREENING TOOL     IDENTIFICATION  Patient Name: Roy Hickman Birthdate: July 15, 1940 Sex: male Admission Date (Current Location): 01/02/2016  [facility] [place]   Facility and Address:  [facility],  [address], [place] [id number]      Provider Number: [id number]  Attending Physician Name and Address:  Leroy SeaPrashant K Singh, MD  Relative Name and Phone Number:       Current Level of Care: Hospital Recommended Level of Care: Skilled Nursing Facility Prior Approval Number:    Date Approved/Denied:   PASRR Number: [id number]  Discharge Plan: SNF    Current Diagnoses: Patient Active Problem List   Diagnosis Date Noted  . Encounter for palliative care   . Goals of care, counseling/discussion   . Hypothyroidism 01/03/2016  . Hypokalemia 01/03/2016  . Pressure ulcer 01/03/2016  . Anemia due to GI blood loss   . Bleeding on Coumadin (HCC)   . Supratherapeutic INR   . Chronic combined systolic and diastolic congestive heart failure (HCC)   . GIB (gastrointestinal bleeding) 01/02/2016  . Failure to thrive (0-17) 01/02/2016  . Chronic anticoagulation   . Elevated troponin 06/11/2014  . Faintness   . Syncope 06/10/2014  . Dementia 06/10/2014  . Dyslipidemia 02/19/2013  . Long term current use of anticoagulant therapy 09/22/2012  . DVT of leg (deep venous thrombosis), unspecified laterality 09/22/2012  . Encephalopathy 08/20/2011  . Urinary retention 08/20/2011  . Urosepsis 08/13/2011  . Acute renal failure (HCC) 08/13/2011  . DVT of leg (deep venous thrombosis) (HCC) 08/13/2011  . Anemia 08/13/2011  . Difficulty walking 05/11/2011  . 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 Myoview low risk 1/13 05/11/2011  . DM2 (diabetes mellitus, type 2) (HCC) 05/11/2011  . HTN (hypertension) 05/11/2011  . Low back pain 05/11/2011    Orientation RESPIRATION BLADDER Height & Weight     Self  Normal Incontinent Weight: 168 lb  6.9 oz (76.4 kg) Height:  6' (182.9 cm)  BEHAVIORAL SYMPTOMS/MOOD NEUROLOGICAL BOWEL NUTRITION STATUS      Continent Diet (Please see discharge summary.)  AMBULATORY STATUS COMMUNICATION OF NEEDS Skin   Total Care Verbally PU Stage and Appropriate Care   PU Stage 2 Dressing:  (PRN)                   Personal Care Assistance Level of Assistance  Bathing, Feeding, Dressing Bathing Assistance: Maximum assistance Feeding assistance: Maximum assistance Dressing Assistance: Maximum assistance     Functional Limitations Info             SPECIAL CARE FACTORS FREQUENCY  PT (By licensed PT), OT (By licensed OT)     PT Frequency: 5 OT Frequency: 5            Contractures      Additional Factors Info  Code Status, Allergies Code Status Info: DNR Allergies Info: No known allergies           Current Medications (01/04/2016):  This is the current hospital active medication list Current Facility-Administered Medications  Medication Dose Route Frequency Provider Last Rate Last Dose  . 0.9 %  sodium chloride infusion   Intravenous Continuous Leroy SeaPrashant K Singh, MD   0  at 01/04/16 [phone]  . acetaminophen (TYLENOL) tablet 500 mg  500 mg Oral Q6H PRN Lorretta HarpXilin Niu, MD      . acetaminophen-codeine (TYLENOL #3) 300-30 MG per tablet 1 tablet  1 tablet Oral Q8H PRN  Lorretta HarpXilin Niu, MD      . antiseptic oral rinse (CPC / CETYLPYRIDINIUM CHLORIDE 0.05%) solution 7 mL  7 mL Mouth Rinse BID Lorretta HarpXilin Niu, MD   7 mL at 01/04/16 0936  . atorvastatin (LIPITOR) tablet 40 mg  40 mg Oral QHS Lorretta HarpXilin Niu, MD   40 mg at 01/03/16 2131  . DULoxetine (CYMBALTA) DR capsule 60 mg  60 mg Oral Daily Lorretta HarpXilin Niu, MD   60 mg at 01/04/16 0934  . folic acid ([name]) tablet 1 mg  1 mg Oral Daily Lorretta HarpXilin Niu, MD   1 mg at 01/04/16 0934  . haloperidol lactate (HALDOL) injection 1 mg  1 mg Intravenous Q6H PRN Leroy SeaPrashant K Singh, MD      . insulin aspart (novoLOG) injection 0-5 Units  0-5 Units Subcutaneous QHS Lorretta HarpXilin Niu, MD   0 Units  at 01/03/16 0015  . insulin aspart (novoLOG) injection 0-9 Units  0-9 Units Subcutaneous TID WC Lorretta HarpXilin Niu, MD   2 Units at 01/04/16 1137  . levothyroxine (SYNTHROID, LEVOTHROID) tablet 25 mcg  25 mcg Oral QAC breakfast Lorretta HarpXilin Niu, MD   25 mcg at 01/04/16 0933  . lisinopril ([name],[name]) tablet 2.5 mg  2.5 mg Oral Daily Leroy SeaPrashant K Singh, MD   2.5 mg at 01/04/16 0953  . metoprolol tartrate (LOPRESSOR) tablet 25 mg  25 mg Oral BID Leroy SeaPrashant K Singh, MD   25 mg at 01/04/16 0933  . mupirocin ointment (BACTROBAN) 2 % 1 application  1 application Topical TID PRN Lorretta HarpXilin Niu, MD      . nystatin-triamcinolone [facility](MYCOLOG II) cream 1 application  1 application Topical PRN Lorretta HarpXilin Niu, MD      . ondansetron [facility](ZOFRAN) injection 4 mg  4 mg Intravenous Q6H PRN Lorretta HarpXilin Niu, MD      . pantoprazole (PROTONIX) injection 40 mg  40 mg Intravenous Q12H Lorretta HarpXilin Niu, MD   40 mg at 01/04/16 0946  . QUEtiapine (SEROQUEL) tablet 50 mg  50 mg Oral BID Lorretta HarpXilin Niu, MD   50 mg at 01/04/16 0933  . tamsulosin (FLOMAX) capsule 0.4 mg  0.4 mg Oral Daily Lorretta HarpXilin Niu, MD   0.4 mg at 01/04/16 [id number]     Discharge Medications: Please see discharge summary for a list of discharge medications.  Relevant Imaging Results:  Relevant Lab Results:   Additional Information SSN: [id number]  Rod MaeVaughn, Emily S, LCSW

## 2016-01-04 NOTE — Progress Notes (Signed)
Patient ID: Roy Hickman, male   DOB: 1941-02-20, 75 y.o.   MRN: [id number]  Palliative note seen. CT report noted.   Patient denies abdominal pain.  Abd: soft, nontender, nondistended  Severe anemia and CT showing irregular proximal colonic wall concerning for malignancy vs. Inflammation. Agree with comfort measures. Will sign off. Call if questions.

## 2016-01-04 NOTE — Progress Notes (Signed)
Nutrition Brief Note  Chart reviewed. Pt now transitioning to comfort care.  No further nutrition interventions warranted at this time.  Please re-consult as needed.   Lemoine Goyne M. Charvi Gammage, MS, RD LDN Inpatient Clinical Dietitian Pager [phone]    

## 2016-01-04 NOTE — Clinical Social Work Placement (Signed)
   CLINICAL SOCIAL WORK PLACEMENT  NOTE  Date:  01/04/2016  Patient Details  Name: Wendall Stadeaul H Faires MRN: [id number] Date of Birth: 1941/03/19  Clinical Social Work is seeking post-discharge placement for this patient at the Skilled  Nursing Facility level of care (*CSW will initial, date and re-position this form in  chart as items are completed):  Yes   Patient/family provided with [facility] [place] Clinical Social Work Department's list of facilities offering this level of care within the geographic area requested by the patient (or if unable, by the patient's family).  Yes   Patient/family informed of their freedom to choose among providers that offer the needed level of care, that participate in Medicare, Medicaid or managed care program needed by the patient, have an available bed and are willing to accept the patient.  Yes   Patient/family informed of [facility] ownership interest [facility] and [facility], as well as of the fact that they are under no obligation to receive care at these facilities.  PASRR submitted to EDS on       PASRR number received on       Existing PASRR number confirmed on 01/04/16     FL2 transmitted to all facilities in geographic area requested by pt/family on 01/04/16     FL2 transmitted to all facilities within larger geographic area on       Patient informed that his/her managed care company has contracts with or will negotiate with certain facilities, including the following:            Patient/family informed of bed offers received.  Patient chooses bed at       Physician recommends and patient chooses bed at      Patient to be transferred to   on  .  Patient to be transferred to facility by       Patient family notified on   of transfer.  Name of family member notified:        PHYSICIAN Please sign FL2     Additional Comment:    _______________________________________________ Rod MaeVaughn, Destany Severns S, LCSW 01/04/2016, 2:54 PM

## 2016-01-04 NOTE — Evaluation (Signed)
Physical Therapy Evaluation Patient Details Name: Roy Hickman MRN: [id number] DOB: 1941-06-13 Today's Date: 01/04/2016   History of Present Illness  75 y.o. male with medical history significant of hypertension, hyperlipidemia, diabetes mellitus, GERD, hypothyroidism, depression, dementia, CAD, s/p CABG 2007, DVT on Coumadin, BPH, CHF, who presents with failure to thrive.  Clinical Impression  Pt admitted with above diagnosis. Pt currently with functional limitations due to the deficits listed below (see PT Problem List).   Pt will benefit from skilled PT to increase their independence and safety with mobility to allow discharge to the venue listed below.       Follow Up Recommendations SNF    Equipment Recommendations  None recommended by PT    Recommendations for Other Services       Precautions / Restrictions Precautions Precautions: Fall Restrictions Weight Bearing Restrictions: No      Mobility  Bed Mobility Overal bed mobility: Needs Assistance Bed Mobility: Supine to Sit;Sit to Supine     Supine to sit: Max assist;HOB elevated Sit to supine: +2 for physical assistance;Mod assist   General bed mobility comments: (A) to bring feet up and max cues for sequence. pt benefits from tactile cues to sequence  Transfers Overall transfer level: Needs assistance Equipment used: 2 person hand held assist;Rolling walker (2 wheeled) Transfers: Sit to/from Stand Sit to Stand: +2 physical assistance;Max assist;From elevated surface         General transfer comment: bed  pad used to assist  hip extension, knees block by PO/OT; attempted x 3 for positioning and instruction  Ambulation/Gait             General Gait Details: NT--unable  Stairs            Wheelchair Mobility    Modified Rankin (Stroke Patients Only)       Balance Overall balance assessment: Needs assistance Sitting-balance support: Bilateral upper extremity supported;Feet supported Sitting  balance-Leahy Scale: Zero     Standing balance support: Bilateral upper extremity supported;During functional activity Standing balance-Leahy Scale: Zero                               Pertinent Vitals/Pain Pain Assessment: No/denies pain    Home Living Family/patient expects to be discharged to:: Private residence               Home Equipment: Environmental consultantWalker - 2 wheels Additional Comments: pt poor historian, no family present    Prior Function Level of Independence: Independent with assistive device(s)         Comments: per pt he walks with a walker "sometimes"     Hand Dominance   Dominant Hand: Right    Extremity/Trunk Assessment   Upper Extremity Assessment: Defer to OT evaluation;Generalized weakness           Lower Extremity Assessment: Generalized weakness      Cervical / Trunk Assessment: Kyphotic  Communication   Communication: No difficulties  Cognition Arousal/Alertness: Awake/alert Behavior During Therapy: Flat affect Overall Cognitive Status: History of cognitive impairments - at baseline                      General Comments General comments (skin integrity, edema, etc.): pt with bandages noted on buttock. pt with redness and bruising due to poor ability to reposition himself. pt with incr risk for skin break down and could benefit from frequent repositioning by RN staff.  Exercises        Assessment/Plan    PT Assessment Patient needs continued PT services  PT Diagnosis Generalized weakness;Difficulty walking   PT Problem List Decreased strength;Decreased range of motion;Decreased activity tolerance;Decreased balance;Decreased mobility;Decreased safety awareness  PT Treatment Interventions DME instruction;Gait training;Functional mobility training;Therapeutic activities;Therapeutic exercise;Patient/family education   PT Goals (Current goals can be found in the Care Plan section) Acute Rehab PT Goals Patient Stated  Goal: none stated PT Goal Formulation: With patient Time For Goal Achievement: 01/18/16 Potential to Achieve Goals: Good    Frequency Min 3X/week   Barriers to discharge        Co-evaluation PT/OT/SLP Co-Evaluation/Treatment: Yes Reason for Co-Treatment: For patient/therapist safety PT goals addressed during session: Mobility/safety with mobility         End of Session Equipment Utilized During Treatment: Gait belt Activity Tolerance: Patient tolerated treatment well Patient left: in bed;with call bell/phone within reach;with bed alarm set Nurse Communication: Mobility status         Time: [id number] PT Time Calculation (min) (ACUTE ONLY): 19 min   Charges:   PT Evaluation $PT Eval Moderate Complexity: 1 Procedure     PT G Codes:        Roy Hickman 01/04/2016, 12:59 PM

## 2016-01-04 NOTE — Progress Notes (Signed)
PROGRESS NOTE                                                                                                                                                                                                             Patient Demographics:    Roy Hickman, is a 75 y.o. male, DOB - 21-May-1941, WJX:[id number]  Admit date - 01/02/2016   Admitting Physician Lorretta HarpXilin Niu, MD  Outpatient Primary MD for the patient is Remus LofflerJones, Angel S, PA  LOS - 2  Chief Complaint  Patient presents with  . Failure To Thrive       Brief Narrative    Roy Hickman is a 75 y.o. male with medical history significant of hypertension, hyperlipidemia, diabetes mellitus, GERD, hypothyroidism, depression, dementia, CAD, s/p CABG 2007, DVT on Coumadin, BPH, CHF, who presents with failure to thrive.  Patient has dementia and is unable to provide accurate medical history, therefore, most of the history is obtained by discussing the case with ED physician, per family, and with the nursing staff.   Per his sister, pt was noted to have failure to thrive recently. pt has "not been eating for 3 days".no specific complaints or pain. Patient may have mild dry cough per sister, but does not seem to have chest pain, fever or chills. No nausea, vomiting or diarrhea noted. Patient moves all extremities. No black stool or bloody stool, no hematuria. He was found to have hemoglobin drop from 9.6 on 06/21/14-->5.4 on admission. Patient never had EGD or colonoscopy in the past.  ED Course: pt was found to have positive FOBT, INR 6.45, WBC 8.1, temperature normal, no tachycardia, potassium 3.0, creatinine normal, negative urinalysis, negative chest x-ray for acute abnormalities, negative CT head for acute intracranial abnormalities. Patient is admitted to telemetry bed as inpt. Dr. Levin ErpSchoolar was consulted.   Subjective:    Roy Graysaul Sabas today has, No headache, No chest pain, No  abdominal pain - No Nausea, No new weakness tingling or numbness, No Cough - SOB.     Assessment  & Plan :     1. Acute GI bleeding associated Acute on chronic anemia in the setting of supratherapeutic INR. Bleeding appears to be lower GI based on the CT scan findings of ascending colon abnormality which could be malignancy versus ischemic bowel versus infection, anemia was microcytic and likely  has some element of chronic occult blood loss as well although anemia panel was inconclusive, has been transfused 2 units on the day of admission with stable H&H, continued to monitor. INR has been reversed with vitamin K and stable. We'll switch to oral PPI, clear liquid diet, GI following.  2. Microcytic anemia. Plan as above, has been transfused 2 units on admission, H&H now stable, stable anemia panel .  3. Chronic systolic CHF EF now 25% down from 45% 2 years ago. Currently compensated on beta blocker, we'll had low-dose ACE inhibitor and monitor.  4. History of DVT in 2013. Repeat venous ultrasound this admission shows chronic clots but nothing acute, we'll defer long-term anti-coordination to PCP.  5. GERD. On PPI  6. BPH. On Flomax.  7. Baseline dementia. At risk for delirium, continue home dose Seroquel. As needed IV Haldol.  8. Hypothyroidism. continue home dose Synthroid.  9. Dyslipidemia. On statin.  10. CAD status post CABG. No acute issues.  11. Hypokalemia and hypomagnesemia. Both replaced & stable.   12. Asypmtomatic 15 beat run of NSVT - lytes replaced, added B blocker, echo as above. Discussed with cardiologist Dr. Rennis Golden outpatient follow-up. Now DO NOT RESUSCITATE. For asymptomatic V. tach will discontinue telemetry.  13. Diet-controlled DM type II. On sliding scale for now.  Lab Results  Component Value Date   HGBA1C 7.1* 06/10/2014   CBG (last 3)   Recent Labs  01/03/16 1148 01/03/16 1649 01/03/16 2047  GLUCAP 111* 139* 129*      Family Communication  :  Cussed with power of attorney over the phone on 01/04/2016  Code Status :  DO NOT RESUSCITATE  Diet : Clear liquids  Disposition Plan  :  Stay inpatient  Consults  :  GI Schooler, palliative care, cardiology over the phone  Procedures  :    Bilateral lower extremity venous duplex completed.  Preliminary report: Bilateral: No evidence of acute DVT, superficial thrombosis, or Baker's Cyst. Non occlusive chronic appearing thrombosis noted in the CFV. Occlusive chronic thrombus in the FV with collateral venous flow noted.  CT Head - Non acute  CT abdomen pelvis. Question ascending colon abnormality.  TTE Left ventricle: The cavity size was normal. Wall thickness was normal. Systolic function was severely reduced. The estimated ejection fraction was 20%. Diffuse hypokinesis. Doppler parameters are consistent with abnormal left ventricular relaxation (grade 1 diastolic dysfunction).  Impressions: - Technically difficult; severe global reduction in LV function;  grade 1 diastolic dysfunction.  DVT Prophylaxis  :    SCDs    Lab Results  Component Value Date   PLT 355 01/03/2016    Inpatient Medications  Scheduled Meds: . antiseptic oral rinse  7 mL Mouth Rinse BID  . atorvastatin  40 mg Oral QHS  . DULoxetine  60 mg Oral Daily  . folic acid  1 mg Oral Daily  . insulin aspart  0-5 Units Subcutaneous QHS  . insulin aspart  0-9 Units Subcutaneous TID WC  . levothyroxine  25 mcg Oral QAC breakfast  . metoprolol tartrate  25 mg Oral BID  . pantoprazole (PROTONIX) IV  40 mg Intravenous Q12H  . QUEtiapine  50 mg Oral BID  . tamsulosin  0.4 mg Oral Daily   Continuous Infusions: . sodium chloride 50 mL/hr at 01/04/16 0533   PRN Meds:.acetaminophen, acetaminophen-codeine, haloperidol lactate, mupirocin ointment, nystatin-triamcinolone, [DISCONTINUED] ondansetron **OR** ondansetron (ZOFRAN) IV  Antibiotics  :    Anti-infectives    None  Objective:   Filed Vitals:    01/03/16 1200 01/03/16 1433 01/03/16 2049 01/04/16 0505  BP: 131/61 145/71 137/68 123/55  Pulse: 79 74 64 58  Temp:  97.6 F (36.4 C) 98.1 F (36.7 C) 98.1 F (36.7 C)  TempSrc:  Oral Oral Oral  Resp:  18 20 20   Height:      Weight:      SpO2:  99% 97% 97%    Wt Readings from Last 3 Encounters:  01/03/16 76.4 kg (168 lb 6.9 oz)  08/02/14 83.462 kg (184 lb)  07/01/14 78.654 kg (173 lb 6.4 oz)     Intake/Output Summary (Last 24 hours) at 01/04/16 [id number] Last data filed at 01/04/16 0607  Gross per 24 hour  Intake 1230.84 ml  Output    450 ml  Net 780.84 ml     Physical Exam  Awake Alert, Oriented X 1, No new F.N deficits, Normal affect [place].AT,PERRAL Supple Neck,No JVD, No cervical lymphadenopathy appriciated.  Symmetrical Chest wall movement, Good air movement bilaterally, CTAB RRR,No Gallops,Rubs or new Murmurs, No Parasternal Heave +ve B.Sounds, Abd Soft, No tenderness, No organomegaly appriciated, No rebound - guarding or rigidity. No Cyanosis, Clubbing or edema, No new Rash or bruise      Data Review:    CBC  Recent Labs Lab 01/02/16 2059 01/03/16 0821  01/03/16 1550 01/03/16 2018 01/04/16 0018 01/04/16 0451 01/04/16 0751  WBC 8.1 8.3  --   --   --   --   --   --   HGB 5.4* 7.8*  < > 8.2* 8.2* 7.9* 8.2* 8.4*  HCT 19.4* 25.6*  < > 26.7* 26.8* 25.3* 26.8* 27.8*  PLT 366 355  --   --   --   --   --   --   MCV 63.8* 66.8*  --   --   --   --   --   --   MCH 17.8* 20.4*  --   --   --   --   --   --   MCHC 27.8* 30.5  --   --   --   --   --   --   RDW 19.3* 20.6*  --   --   --   --   --   --   LYMPHSABS 1.1  --   --   --   --   --   --   --   MONOABS 0.8  --   --   --   --   --   --   --   EOSABS 0.1  --   --   --   --   --   --   --   BASOSABS 0.0  --   --   --   --   --   --   --   < > = values in this interval not displayed.  Chemistries   Recent Labs Lab 01/02/16 2059 01/03/16 0821 01/04/16 0451  NA 138 140 138  K 3.0* 3.5 4.0  CL 107 109  110  CO2 25 27 23   GLUCOSE 122* 114* 127*  BUN 13 12 8   CREATININE 0.77 0.73 0.70  CALCIUM 7.9* 7.9* 7.7*  MG  --  1.6* 2.2  AST 14*  --   --   ALT 7*  --   --   ALKPHOS 73  --   --   BILITOT 0.6  --   --    ------------------------------------------------------------------------------------------------------------------  No results for input(s): CHOL, HDL, LDLCALC, TRIG, CHOLHDL, LDLDIRECT in the last 72 hours.  Lab Results  Component Value Date   HGBA1C 7.1* 06/10/2014   ------------------------------------------------------------------------------------------------------------------ No results for input(s): TSH, T4TOTAL, T3FREE, THYROIDAB in the last 72 hours.  Invalid input(s): FREET3 ------------------------------------------------------------------------------------------------------------------  Recent Labs  01/03/16 1140  VITAMINB12 239  FOLATE 43.2  FERRITIN 54  TIBC 238*  IRON 112  RETICCTPCT 0.4    Coagulation profile  Recent Labs Lab 01/02/16 2059 01/03/16 0821  INR 6.45* 2.01*    No results for input(s): DDIMER in the last 72 hours.  Cardiac Enzymes No results for input(s): CKMB, TROPONINI, MYOGLOBIN in the last 168 hours.  Invalid input(s): CK ------------------------------------------------------------------------------------------------------------------    Component Value Date/Time   BNP 236.8* 01/03/2016 1610    Micro Results Recent Results (from the past 240 hour(s))  MRSA PCR Screening     Status: None   Collection Time: 01/03/16 12:15 AM  Result Value Ref Range Status   MRSA by PCR NEGATIVE NEGATIVE Final    Comment:        The GeneXpert MRSA Assay (FDA approved for NASAL specimens only), is one component of a comprehensive MRSA colonization surveillance program. It is not intended to diagnose MRSA infection nor to guide or monitor treatment for MRSA infections.     Radiology Reports Dg Chest 2 View  01/02/2016   CLINICAL DATA:  75 year old male with failure to thrive EXAM: CHEST  2 VIEW COMPARISON:  Chest radiograph dated 06/10/2014 FINDINGS: Two views of the chest demonstrate emphysematous changes of the lungs. There is no focal consolidation, pleural effusion, or pneumothorax. The cardiac silhouette is within normal limits. Median sternotomy wires and CABG vascular clips noted. The bones are osteopenic. No acute fracture. IMPRESSION: No active cardiopulmonary disease. Electronically Signed   By: Elgie Collard M.D.   On: 01/02/2016 21:19   Ct Head Wo Contrast  01/02/2016  CLINICAL DATA:  75 year old male with altered mental status. EXAM: CT HEAD WITHOUT CONTRAST TECHNIQUE: Contiguous axial images were obtained from the base of the skull through the vertex without intravenous contrast. COMPARISON:  Head CT dated 06/11/2014 FINDINGS: There is moderate age-related atrophy and chronic microvascular ischemic changes. No acute intracranial hemorrhage. No mass effect or midline shift. The visualized paranasal sinuses and mastoid air cells are clear. The calvarium is intact. IMPRESSION: No acute intracranial hemorrhage. Age-related atrophy and chronic microvascular ischemic disease. If symptoms persist and there are no contraindications, MRI may provide better evaluation if clinically indicated. Electronically Signed   By: Elgie Collard M.D.   On: 01/02/2016 22:59   Ct Abdomen Pelvis W Contrast  01/03/2016  CLINICAL DATA:  75 year old male with GI bleed and anemia EXAM: CT ABDOMEN AND PELVIS WITH CONTRAST TECHNIQUE: Multidetector CT imaging of the abdomen and pelvis was performed using the standard protocol following bolus administration of intravenous contrast. CONTRAST:  ISOVUE-300 IOPAMIDOL (ISOVUE-300) INJECTION 61% COMPARISON:  None. FINDINGS: Lower chest: Small right and trace left pleural effusions with associated lower lobe atelectasis. Additionally, there is mild calcification along the pleural margin and  terminal bronchioles in the atelectatic lower lobes. Diffuse mild bronchial wall thickening. Tree-in-bud micro nodularity in the periphery of the left lower lobe. The heart is normal in size. No pericardial effusion. Hepatobiliary: No discrete hepatic lesion. Although the gallbladder is largely decompressed, the wall appears somewhat thickened and there is trace pericholecystic fluid. No intra or extrahepatic biliary ductal dilatation Pancreas: No mass, inflammatory changes, or other significant abnormality.  Spleen: Within normal limits for size. Nonspecific 1.6 cm low-attenuation lesion in the posterior aspect of the upper spleen. Adrenals/Urinary Tract: Thickening versus a nodularity of the superior aspect of both adrenal glands measuring up to 1.6 cm on the right and 1.6 cm on the left. No evidence of hydronephrosis, nephrolithiasis or enhancing renal mass. Stomach/Bowel: Focal region of abnormal and irregular submucosal thickening involving the ascending colon along the length of approximately 7.3 cm. There is interstitial stranding in the adjacent pericolonic fat. No definite associated adenopathy. Vascular/Lymphatic: No pathologically enlarged lymph nodes. No evidence of abdominal aortic aneurysm. Scattered atherosclerotic calcifications are present throughout the abdominal aorta and branch vessels. Reproductive: No mass or other significant abnormality. Other: None. Musculoskeletal:  No suspicious bone lesions identified. IMPRESSION: 1. Proximally 7.3 cm long region of irregular submucosal thickening involving the ascending colon with associated interstitial stranding in the pericolonic fat. Differential considerations include focal colitis (infectious or inflammatory) and colonic neoplasm. No definite mesenteric adenopathy or evidence of metastatic disease to favor malignancy. 2. Small volume free fluid in the right lower quadrant almost certainly related to the colonic process described above. 3.  Nonspecific gallbladder wall thickening and trace pericholecystic fluid without associated gallbladder distension or visible cholelithiasis. Differential considerations include both acute and chronic cholecystitis as well as hypoalbuminemia and intrinsic liver disease. 4. Nonspecific bilateral adrenal gland nodularity. Although imaging is not definitive, adenomas or benign adrenal hyperplasia are favored. 5. Small right and trace left pleural effusions with associated left lower lobe atelectasis. 6. Diffuse mild bronchial wall thickening with tree-in-bud micro nodularity in the periphery of the left lower lobe likely reflecting a remote infectious/inflammatory process. 7. Nonspecific 1.6 cm low-attenuation lesion in the posterior aspect of the spleen. This likely represents a small cyst, hemangioma or other benign lesion. 8.  Aortic Atherosclerosis (ICD10-170.0) Electronically Signed   By: Malachy Moan M.D.   On: 01/03/2016 13:05    Time Spent in minutes  30   Kino Dunsworth K M.D on 01/04/2016 at 9:22 AM  Between 7am to 7pm - Pager - [phone]  After 7pm go to [email or web address] - password [email or web address]  [name] -  Office  [phone]

## 2016-01-04 NOTE — Consult Note (Signed)
Consultation Note Date: 01/04/2016   Patient Name: Roy Hickman  DOB: 1940/12/21  MRN: [id number]  Age / Sex: 75 y.o., male  PCP: Remus LofflerAngel S Jones, PA Referring Physician: Leroy SeaPrashant K Singh, MD  Reason for Consultation: Establishing goals of care  HPI/Patient Profile: 75 y.o. male  admitted on 01/02/2016    Clinical Assessment and Goals of Care:  75 year old gentleman with a past medical history significant for coronary artery disease status post CABG, Dementia, multiple other medical conditions such as diabetes, hypertension, use of long-term oral anticoagulation with warfarin for a remote history of DVT, admitted with weakness, severe anemia Hgb 5.8, high INR and heme positive stool. Patient has been seen by cardiology this admission, has been found to have newly decreased LVEF from 35 to 20%. He has had NSVT as well.   Gastroenterology was also consulted. Patient received vitamin K for reversal of anticoagulation due to supra therapeutic INR. CT scan shows ascending colon abnormality which has been read as malignancy versus ischemic bowel versus focal colitis. He is status post 2 units of packed red blood cell transfusion. In light of the patient having several medical conditions and now with possible GI bleed, elevated INR, worsening left ventricle ejection fraction, palliative care consult for goals of care decisions.  Patient is a elderly pleasantly confused gentleman resting in bed. He does not appear to be in any acute distress. He is able to state his name. However, he is not able to provide much history. He has been started on full liquids and is on proton pump inhibitors.  Call placed and discussed with niece healthcare power of attorney Servando SnareLisa Sizemore at [phone]. I introduced myself and palliative care as follows: Palliative medicine is specialized medical care for people living with serious illness. It  focuses on providing relief from the symptoms and stress of a serious illness. The goal is to improve quality of life for both the patient and the family.  See recommendations below. Thank you for the consult.  HCPOA  niece Servando SnareLisa Sizemore at [phone].   SUMMARY OF RECOMMENDATIONS    As per my discussions with HCPOA agent Niece Servando SnareLisa Sizemore at [phone]:  DNR DNI  No aggressive interventions, no endoscopies, no cath, acceptable to optimize medical  management  SNF rehab with palliative on D/C. Consider addition of hospice support in the near  Future. Likely needs physical therapy consultation and clinical social worker consultation.  Ok to D/C coumadin permanently. Overall goals shifting towards a more palliative/comfort  based approach to his care.   Code Status/Advance Care Planning:  DNR    Symptom Management:    continue current care.   Palliative Prophylaxis:   Bowel Regimen  Psycho-social/Spiritual:   Desire for further Chaplaincy support:no  Additional Recommendations: Caregiving  Support/Resources  Prognosis:   < 6 months possibly.   Discharge Planning: Skilled Nursing Facility for rehab with Palliative care service follow-up      Primary Diagnoses: Present on Admission:  . 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  Myoview low risk 1/13 . Dyslipidemia . HTN (hypertension) . DVT of leg (deep venous thrombosis) (HCC) . Dementia . GIB (gastrointestinal bleeding) . Failure to thrive (0-17) . Anemia due to GI blood loss . Bleeding on Coumadin (HCC) . Supratherapeutic INR . Hypothyroidism . Chronic combined systolic and diastolic congestive heart failure (HCC) . Hypokalemia  I have reviewed the medical record, interviewed the patient and family, and examined the patient. The following aspects are pertinent.  Past Medical History  Diagnosis Date  . Hypertension   . Diabetes mellitus   . MI (mitral incompetence)   . Myocardial infarction [facility]) 2007    s/p CABG   .  Coronary artery disease   . GERD (gastroesophageal reflux disease)   . Hyperlipidemia   . Left leg DVT (HCC)   . Dementia   . Chronic combined systolic and diastolic congestive heart failure [facility])    Social History   Social History  . Marital Status: Widowed    Spouse Name: N/A  . Number of Children: N/A  . Years of Education: N/A   Social History Main Topics  . Smoking status: Former Smoker -- 2.00 packs/day for 25 years    Types: Cigarettes    Start date: 04/01/1952    Quit date: 02/27/1987  . Smokeless tobacco: Never Used  . Alcohol Use: No     Comment: quit drinking in 1984  . Drug Use: No  . Sexual Activity: No   Other Topics Concern  . None   Social History Narrative   Has been at [facility] [place]. Was living in an apt by himself before being at [facility] since 05/2010. Has 5 sisters, no brothers. [phone]. [name], Steward Drone Sizemore.    Family History  Problem Relation Age of Onset  . Heart failure Father   . Heart failure Sister    Scheduled Meds: . antiseptic oral rinse  7 mL Mouth Rinse BID  . atorvastatin  40 mg Oral QHS  . DULoxetine  60 mg Oral Daily  . folic acid  1 mg Oral Daily  . insulin aspart  0-5 Units Subcutaneous QHS  . insulin aspart  0-9 Units Subcutaneous TID WC  . levothyroxine  25 mcg Oral QAC breakfast  . lisinopril  2.5 mg Oral Daily  . metoprolol tartrate  25 mg Oral BID  . pantoprazole (PROTONIX) IV  40 mg Intravenous Q12H  . QUEtiapine  50 mg Oral BID  . tamsulosin  0.4 mg Oral Daily   Continuous Infusions: . sodium chloride     PRN Meds:.acetaminophen, acetaminophen-codeine, haloperidol lactate, mupirocin ointment, nystatin-triamcinolone, [DISCONTINUED] ondansetron **OR** ondansetron (ZOFRAN) IV Medications Prior to Admission:  Prior to Admission medications   Medication Sig Start Date End Date Taking? Authorizing Provider  acetaminophen (TYLENOL) 500 MG tablet Take 500 mg by mouth every 6 (six) hours  as needed for moderate pain or headache.   Yes Historical Provider, MD  acetaminophen-codeine (TYLENOL #3) 300-30 MG per tablet Take 1 tablet by mouth every 8 (eight) hours as needed for moderate pain.  06/24/14  Yes Historical Provider, MD  atorvastatin (LIPITOR) 40 MG tablet Take 40 mg by mouth at bedtime.    Yes Historical Provider, MD  DULoxetine (CYMBALTA) 60 MG capsule Take 60 mg by mouth daily.   Yes Historical Provider, MD  folic acid ([name]) 1 MG tablet Take 1 mg by mouth daily.    Yes Historical Provider, MD  hydrOXYzine ([name]) 25 MG tablet Take 25  mg by mouth every 8 (eight) hours as needed for anxiety or itching.   Yes Historical Provider, MD  levothyroxine (SYNTHROID, LEVOTHROID) 25 MCG tablet Take 25 mcg by mouth daily before breakfast.   Yes Historical Provider, MD  mupirocin ointment (BACTROBAN) 2 % Apply 1 application topically 3 (three) times daily as needed (but sores).    Yes Historical Provider, MD  nystatin-triamcinolone (MYCOLOG II) cream Apply 1 application topically as needed (skin irritation).  12/18/12  Yes Historical Provider, MD  omeprazole (PRILOSEC) 20 MG capsule Take 20 mg by mouth daily.   Yes Historical Provider, MD  QUEtiapine (SEROQUEL) 50 MG tablet Take 50 mg by mouth 2 (two) times daily.  01/24/13  Yes Historical Provider, MD  tamsulosin (FLOMAX) 0.4 MG CAPS capsule Take 1 capsule (0.4 mg total) by mouth daily. 07/02/14  Yes Jodelle GrossKathryn M Lawrence, NP  warfarin (COUMADIN) 5 MG tablet Take 5 mg by mouth daily.    Yes Historical Provider, MD   No Known Allergies Review of Systems No complaints.   Physical Exam Elderly gentleman pleasantly confused resting in bed S1-S2 Clear breathing Abdomen soft and denies any pain No edema Is able to state his name. However is not able to fully participate in history taking  Vital Signs: BP 123/55 mmHg  Pulse 58  Temp(Src) 98.1 F (36.7 C) (Oral)  Resp 20  Ht 6' (1.829 m)  Wt 76.4 kg (168 lb 6.9 oz)  BMI  22.84 kg/m2  SpO2 97% Pain Assessment: No/denies pain   Pain Score: 0-No pain   SpO2: SpO2: 97 % O2 Device:SpO2: 97 % O2 Flow Rate: .O2 Flow Rate (L/min): 2 L/min  IO: Intake/output summary:  Intake/Output Summary (Last 24 hours) at 01/04/16 1052 Last data filed at 01/04/16 [id number]  Gross per 24 hour  Intake 1530.84 ml  Output    450 ml  Net 1080.84 ml    LBM: Last BM Date:  (pta ) Baseline Weight: Weight: 76.4 kg (168 lb 6.9 oz) Most recent weight: Weight: 76.4 kg (168 lb 6.9 oz)     Palliative Assessment/Data:   Flowsheet Rows        Most Recent Value   Intake Tab    Referral Department  Hospitalist   Unit at Time of Referral  Med/Surg Unit   Palliative Care Primary Diagnosis  Other (Comment)   Palliative Care Type  New Palliative care   Reason for referral  Clarify Goals of Care   Date first seen by Palliative Care  01/04/16   Clinical Assessment    Palliative Performance Scale Score  30%   Pain Max last 24 hours  4   Pain Min Last 24 hours  3   Dyspnea Max Last 24 Hours  3   Dyspnea Min Last 24 hours  2   Psychosocial & Spiritual Assessment    Palliative Care Outcomes    Patient/Family meeting held?  Yes   Who was at the meeting?  niece Fletcher AnonLisa hcpoa    Palliative Care follow-up planned  Yes, Facility      Time In: 955 Time Out: 1055 Time Total: 60 Greater than 50%  of this time was spent counseling and coordinating care related to the above assessment and plan.  Signed by: Rosalin HawkingZeba Roben Schliep, MD  [phone]  [phone]  Please contact Palliative Medicine Team phone at [phone] for questions and concerns.  For individual provider: See Loretha StaplerAmion

## 2016-01-04 NOTE — Evaluation (Signed)
Occupational Therapy Evaluation Patient Details Name: Wendall Stadeaul H Ganson MRN: [id number] DOB: 25-May-1941 Today's Date: 01/04/2016    History of Present Illness 75 y.o. male with medical history significant of hypertension, hyperlipidemia, diabetes mellitus, GERD, hypothyroidism, depression, dementia, CAD, s/p CABG 2007, DVT on Coumadin, BPH, CHF, who presents with failure to thrive.   Clinical Impression   PT admitted with FTT. Pt currently with functional limitiations due to the deficits listed below (see OT problem list). Pt poor historian and decr ability to complete basic transfers. Pt high fall risk and lack of awareness. Pt will benefit from skilled OT to increase their independence and safety with adls and balance to allow discharge SNF. Pt requires hoyer lift and frequent repositioning by RN staff to decr risk for skin break down.      Follow Up Recommendations  SNF;Supervision/Assistance - 24 hour    Equipment Recommendations  3 in 1 bedside comode;Wheelchair (measurements OT);Wheelchair cushion (measurements OT);Hospital bed    Recommendations for Other Services       Precautions / Restrictions Precautions Precautions: Fall      Mobility Bed Mobility Overal bed mobility: Needs Assistance Bed Mobility: Sit to Supine       Sit to supine: +2 for physical assistance;Mod assist   General bed mobility comments: (A) to bring feet up and max cues for sequence. pt benefits from tactile cues to sequence  Transfers Overall transfer level: Needs assistance Equipment used: 2 person hand held assist Transfers: Sit to/from Stand Sit to Stand: +2 physical assistance;Max assist;From elevated surface         General transfer comment: requires pad for hip extension    Balance Overall balance assessment: Needs assistance Sitting-balance support: Bilateral upper extremity supported;Feet supported Sitting balance-Leahy Scale: Zero     Standing balance support: Bilateral upper  extremity supported;During functional activity Standing balance-Leahy Scale: Zero                              ADL Overall ADL's : Needs assistance/impaired     Grooming: Wash/dry hands;Minimal assistance       Lower Body Bathing: Total assistance           Toilet Transfer: +2 for physical assistance;Maximal assistance Toilet Transfer Details (indicate cue type and reason): pt with hip flexion but decr ability to power up into full uprigh tposture and elevated buttock off bed surface         Functional mobility during ADLs:  (unable to attempt) General ADL Comments: Pt sitting EOB with PT tara on arrival with (A) to remain seated EOB. pt attempting sit<>stand with RW. Pt pushing RW head with arms in full extension hip flexion and how effort to activate quads for standing. Pt with RW removed. pt with total +2 person hand held (A) and able to achieve static standing with knee flexed and posterior lean. pt unable to sustain static standing. Pt required pad a hips for extension to static stand < 1 minutes in duration. Pt with R lean in static sitting.      Vision     Perception     Praxis      Pertinent Vitals/Pain Pain Assessment: No/denies pain     Hand Dominance Right   Extremity/Trunk Assessment Upper Extremity Assessment Upper Extremity Assessment: Generalized weakness   Lower Extremity Assessment Lower Extremity Assessment: Defer to PT evaluation   Cervical / Trunk Assessment Cervical / Trunk Assessment: Kyphotic   Communication Communication  Communication: No difficulties   Cognition Arousal/Alertness: Awake/alert Behavior During Therapy: Flat affect Overall Cognitive Status: History of cognitive impairments - at baseline                     General Comments       Exercises       Shoulder Instructions      Home Living Family/patient expects to be discharged to:: Private residence                             Home  Equipment: Dan HumphreysWalker - 2 wheels   Additional Comments: pt poor historian.       Prior Functioning/Environment Level of Independence: Independent with assistive device(s)        Comments: per pt he walks with a walker "sometimes"    OT Diagnosis: Generalized weakness;Cognitive deficits   OT Problem List: Decreased strength;Decreased range of motion;Impaired balance (sitting and/or standing);Decreased activity tolerance;Decreased cognition;Decreased safety awareness;Decreased knowledge of use of DME or AE;Decreased knowledge of precautions;Cardiopulmonary status limiting activity   OT Treatment/Interventions: Self-care/ADL training;Therapeutic exercise;Neuromuscular education;DME and/or AE instruction;Therapeutic activities;Cognitive remediation/compensation;Patient/family education;Balance training    OT Goals(Current goals can be found in the care plan section) Acute Rehab OT Goals Patient Stated Goal: none stated OT Goal Formulation: Patient unable to participate in goal setting Time For Goal Achievement: 01/18/16 Potential to Achieve Goals: Fair  OT Frequency: Min 2X/week   Barriers to D/C:            Co-evaluation              End of Session Equipment Utilized During Treatment: Gait belt Nurse Communication: Mobility status;Precautions  Activity Tolerance: Patient tolerated treatment well Patient left: in bed;with call bell/phone within reach;with bed alarm set   Time: 1000-1020 OT Time Calculation (min): 20 min Charges:  OT General Charges $OT Visit: 1 Procedure OT Evaluation $OT Eval Moderate Complexity: 1 Procedure G-Codes:    Boone MasterJones, Yeraldine Forney B 01/04/2016, 11:41 AM  Mateo FlowJones, Brynn   OTR/L Pager: [phone]: [phone] Office: [phone] .

## 2016-01-05 ENCOUNTER — Ambulatory Visit: Payer: Self-pay | Admitting: Pharmacist Clinician (PhC)/ Clinical Pharmacy Specialist

## 2016-01-05 DIAGNOSIS — Z7901 Long term (current) use of anticoagulants: Secondary | ICD-10-CM

## 2016-01-05 DIAGNOSIS — R1084 Generalized abdominal pain: Secondary | ICD-10-CM

## 2016-01-05 DIAGNOSIS — I82409 Acute embolism and thrombosis of unspecified deep veins of unspecified lower extremity: Secondary | ICD-10-CM

## 2016-01-05 DIAGNOSIS — I2510 Atherosclerotic heart disease of native coronary artery without angina pectoris: Secondary | ICD-10-CM

## 2016-01-05 LAB — TYPE AND SCREEN
ABO/RH(D): O POS
Antibody Screen: NEGATIVE
UNIT DIVISION: 0
UNIT DIVISION: 0

## 2016-01-05 LAB — GLUCOSE, CAPILLARY: GLUCOSE-CAPILLARY: 131 mg/dL — AB (ref 65–99)

## 2016-01-05 MED ORDER — LISINOPRIL 2.5 MG PO TABS
2.5000 mg | ORAL_TABLET | Freq: Every day | ORAL | Status: AC
Start: 2016-01-05 — End: ?

## 2016-01-05 MED ORDER — METOPROLOL TARTRATE 25 MG PO TABS: 25.0000 mg | ORAL_TABLET | Freq: Two times a day (BID) | ORAL | Status: AC

## 2016-01-05 MED ORDER — OMEPRAZOLE 40 MG PO CPDR
40.0000 mg | DELAYED_RELEASE_CAPSULE | Freq: Every day | ORAL | Status: AC
Start: 2016-01-05 — End: ?

## 2016-01-05 NOTE — Discharge Instructions (Signed)
Follow with Primary MD Remus LofflerJones, Angel S, PA in 7 days   Get CBC, CMP, 2 view Chest X ray checked  by Primary MD or SNF MD in 5-7 days ( we routinely change or add medications that can affect your baseline labs and fluid status, therefore we recommend that you get the mentioned basic workup next visit with your PCP, your PCP may decide not to get them or add new tests based on their clinical decision)   Activity: As tolerated with Full fall precautions use walker/cane & assistance as needed   Disposition SNF with Palliative care   Diet:   Soft  with feeding assistance and aspiration precautions.  For Heart failure patients - Check your Weight same time everyday, if you gain over 2 pounds, or you develop in leg swelling, experience more shortness of breath or chest pain, call your Primary MD immediately. Follow Cardiac Low Salt Diet and 1.5 lit/day fluid restriction.   On your next visit with your primary care physician please Get Medicines reviewed and adjusted.   Please request your Prim.MD to go over all Hospital Tests and Procedure/Radiological results at the follow up, please get all Hospital records sent to your Prim MD by signing hospital release before you go home.   If you experience worsening of your admission symptoms, develop shortness of breath, life threatening emergency, suicidal or homicidal thoughts you must seek medical attention immediately by calling 911 or calling your MD immediately  if symptoms less severe.  You Must read complete instructions/literature along with all the possible adverse reactions/side effects for all the Medicines you take and that have been prescribed to you. Take any new Medicines after you have completely understood and accpet all the possible adverse reactions/side effects.   Do not drive, operate heavy machinery, perform activities at heights, swimming or participation in water activities or provide baby sitting services if your were admitted for  syncope or siezures until you have seen by Primary MD or a Neurologist and advised to do so again.  Do not drive when taking Pain medications.    Do not take more than prescribed Pain, Sleep and Anxiety Medications  Special Instructions: If you have smoked or chewed Tobacco  in the last 2 yrs please stop smoking, stop any regular Alcohol  and or any Recreational drug use.  Wear Seat belts while driving.   Please note  You were cared for by a hospitalist during your hospital stay. If you have any questions about your discharge medications or the care you received while you were in the hospital after you are discharged, you can call the unit and asked to speak with the hospitalist on call if the hospitalist that took care of you is not available. Once you are discharged, your primary care physician will handle any further medical issues. Please note that NO REFILLS for any discharge medications will be authorized once you are discharged, as it is imperative that you return to your primary care physician (or establish a relationship with a primary care physician if you do not have one) for your aftercare needs so that they can reassess your need for medications and monitor your lab values.

## 2016-01-05 NOTE — Discharge Summary (Signed)
Roy Hickman:[id number] DOB: 12-Oct-1940 DOA: 01/02/2016  PCP: Remus Loffler, PA  Admit date: 01/02/2016  Discharge date: 01/05/2016  Admitted From: Home   Disposition:  SNF with Pall care   Recommendations for Outpatient Follow-up:   Follow up with PCP in 1-2 weeks  PCP Please obtain BMP/CBC in one week, 2 view CXR (see Discharge instructions)   PCP Please follow up on the following pending results: None   Home Health: None   Equipment/Devices: None  Discharge Condition: Fair    CODE STATUS: DNR   Diet Recommendation: Soft diet with feeding assistance and aspiration precautions Consultations: GI, palliative care, Cards   Chief Complaint  Patient presents with  . Failure To Thrive     Brief history of present illness from the day of admission and additional interim summary    Roy Hickman is a 75 y.o. male with medical history significant of hypertension, hyperlipidemia, diabetes mellitus, GERD, hypothyroidism, depression, dementia, CAD, s/p CABG 2007, DVT on Coumadin, BPH, CHF, who presents with failure to thrive.  Patient has dementia and is unable to provide accurate medical history, therefore, most of the history is obtained by discussing the case with ED physician, per family, and with the nursing staff.   Per his sister, pt was noted to have failure to thrive recently. pt has "not been eating for 3 days".no specific complaints or pain. Patient may have mild dry cough per sister, but does not seem to have chest pain, fever or chills. No nausea, vomiting or diarrhea noted. Patient moves all extremities. No black stool or bloody stool, no hematuria. He was found to have hemoglobin drop from 9.6 on 06/21/14-->5.4 on admission. Patient never had EGD or colonoscopy in the past.  ED Course: pt was found  to have positive FOBT, INR 6.45, WBC 8.1, temperature normal, no tachycardia, potassium 3.0, creatinine normal, negative urinalysis, negative chest x-ray for acute abnormalities, negative CT head for acute intracranial abnormalities. Patient is admitted to telemetry bed as inpt. Dr. Levin Erp was consulted.  Hospital issues addressed     1. Acute GI bleeding associated Acute on chronic anemia in the setting of supratherapeutic INR. Bleeding appears to be lower GI based on the CT scan findings of ascending colon abnormality which could be malignancy versus ischemic bowel versus infection, anemia was microcytic and likely has some element of chronic occult blood loss as well although anemia panel was inconclusive, was transfused 2 units on the day of admission with stable H&H, INR was reversed during admission with vitamin K.   He was seen by GI and palliative care, after detailed discussions with POA was decided that patient will be best treated with conservative management directed towards comfort, he was switched to DO NOT RESUSCITATE, no invasive procedures were desired as management will not change. We'll stop any anticoagulation as the chance of bleeding will be quite high with CT scan findings, risks and benefits discussed with POA excepts. He will be placed on PPI, no invasive workup per GI, goal  of care gentle medical treatment directed towards comfort if he declines further. Hospice..  2. Microcytic anemia. Plan as above, has been transfused 2 units on admission, H&H now stable, stable anemia panel .  3. Chronic systolic CHF EF now 25% down from 45% 2 years ago. Currently compensated been placed on low-dose beta blocker and ACE. Seen by cardiology no further testing recommended which I concur with.  4. History of DVT in 2013. Repeat venous ultrasound this admission shows chronic clots but nothing acute, due to GI bleed and CT scan findings suspicious for mass was ischemic bowel chances of  rebleeding or high, risks benefits discussed with POA agree with discontinuing any further anticoagulation.  5. GERD. On PPI  6. BPH. On Flomax.  7. Baseline dementia. At risk for delirium, continue home dose Seroquel. Was stable this admission.  8. Hypothyroidism. continue home dose Synthroid.  9. Dyslipidemia. On statin.  10. CAD status post CABG. No acute issues.  11. Hypokalemia and hypomagnesemia. Both replaced & stable.  12. Asypmtomatic 15 beat run of NSVT - lytes replaced, added B blocker, echo as above. Discussed with cardiologist Dr. Rennis GoldenHilty outpatient follow-up if desired. Now DO NOT RESUSCITATE.   13. Diet-controlled DM type II. Continue diet control.   Discharge diagnosis     Principal Problem:   GIB (gastrointestinal bleeding) Active Problems:   CAD, CABG X 3 11/07. Myoview low risk 1/13   DM2 (diabetes mellitus, type 2) (HCC)   HTN (hypertension)   DVT of leg (deep venous thrombosis) (HCC)   Long term current use of anticoagulant therapy   Dyslipidemia   Dementia   Failure to thrive (0-17)   Anemia due to GI blood loss   Bleeding on Coumadin (HCC)   Supratherapeutic INR   Hypothyroidism   Chronic combined systolic and diastolic congestive heart failure (HCC)   Hypokalemia   Pressure ulcer   Encounter for palliative care   Goals of care, counseling/discussion   Abdominal pain, generalized    Discharge instructions        Discharge Instructions    Discharge instructions    Complete by:  As directed   Follow with Primary MD Remus LofflerJones, Angel S, PA in 7 days   Get CBC, CMP, 2 view Chest X ray checked  by Primary MD or SNF MD in 5-7 days ( we routinely change or add medications that can affect your baseline labs and fluid status, therefore we recommend that you get the mentioned basic workup next visit with your PCP, your PCP may decide not to get them or add new tests based on their clinical decision)   Activity: As tolerated with Full fall precautions  use walker/cane & assistance as needed   Disposition SNF with Palliative care   Diet:   Soft  with feeding assistance and aspiration precautions.  For Heart failure patients - Check your Weight same time everyday, if you gain over 2 pounds, or you develop in leg swelling, experience more shortness of breath or chest pain, call your Primary MD immediately. Follow Cardiac Low Salt Diet and 1.5 lit/day fluid restriction.   On your next visit with your primary care physician please Get Medicines reviewed and adjusted.   Please request your Prim.MD to go over all Hospital Tests and Procedure/Radiological results at the follow up, please get all Hospital records sent to your Prim MD by signing hospital release before you go home.   If you experience worsening of your admission symptoms, develop shortness of breath,  life threatening emergency, suicidal or homicidal thoughts you must seek medical attention immediately by calling 911 or calling your MD immediately  if symptoms less severe.  You Must read complete instructions/literature along with all the possible adverse reactions/side effects for all the Medicines you take and that have been prescribed to you. Take any new Medicines after you have completely understood and accpet all the possible adverse reactions/side effects.   Do not drive, operate heavy machinery, perform activities at heights, swimming or participation in water activities or provide baby sitting services if your were admitted for syncope or siezures until you have seen by Primary MD or a Neurologist and advised to do so again.  Do not drive when taking Pain medications.    Do not take more than prescribed Pain, Sleep and Anxiety Medications  Special Instructions: If you have smoked or chewed Tobacco  in the last 2 yrs please stop smoking, stop any regular Alcohol  and or any Recreational drug use.  Wear Seat belts while driving.   Please note  You were cared for by a  hospitalist during your hospital stay. If you have any questions about your discharge medications or the care you received while you were in the hospital after you are discharged, you can call the unit and asked to speak with the hospitalist on call if the hospitalist that took care of you is not available. Once you are discharged, your primary care physician will handle any further medical issues. Please note that NO REFILLS for any discharge medications will be authorized once you are discharged, as it is imperative that you return to your primary care physician (or establish a relationship with a primary care physician if you do not have one) for your aftercare needs so that they can reassess your need for medications and monitor your lab values.     Increase activity slowly    Complete by:  As directed            Discharge Medications     Medication List    STOP taking these medications        warfarin 5 MG tablet  Commonly known as:  COUMADIN      TAKE these medications        acetaminophen 500 MG tablet  Commonly known as:  TYLENOL  Take 500 mg by mouth every 6 (six) hours as needed for moderate pain or headache.     acetaminophen-codeine 300-30 MG tablet  Commonly known as:  TYLENOL #3  Take 1 tablet by mouth every 8 (eight) hours as needed for moderate pain.     atorvastatin 40 MG tablet  Commonly known as:  LIPITOR  Take 40 mg by mouth at bedtime.     DULoxetine 60 MG capsule  Commonly known as:  CYMBALTA  Take 60 mg by mouth daily.     folic acid 1 MG tablet  Commonly known as:  [name]  Take 1 mg by mouth daily.     hydrOXYzine 25 MG tablet  Commonly known as:  [name]  Take 25 mg by mouth every 8 (eight) hours as needed for anxiety or itching.     levothyroxine 25 MCG tablet  Commonly known as:  SYNTHROID, LEVOTHROID  Take 25 mcg by mouth daily before breakfast.     lisinopril 2.5 MG tablet  Commonly known as:  [name],[name]  Take 1 tablet  (2.5 mg total) by mouth daily.     metoprolol tartrate 25 MG tablet  Commonly known as:  LOPRESSOR  Take 1 tablet (25 mg total) by mouth 2 (two) times daily.     mupirocin ointment 2 %  Commonly known as:  BACTROBAN  Apply 1 application topically 3 (three) times daily as needed (but sores).     nystatin-triamcinolone cream  Commonly known as:  MYCOLOG II  Apply 1 application topically as needed (skin irritation).     omeprazole 40 MG capsule  Commonly known as:  PRILOSEC  Take 1 capsule (40 mg total) by mouth daily.     QUEtiapine 50 MG tablet  Commonly known as:  SEROQUEL  Take 50 mg by mouth 2 (two) times daily.     tamsulosin 0.4 MG Caps capsule  Commonly known as:  FLOMAX  Take 1 capsule (0.4 mg total) by mouth daily.        No Known Allergies  Follow-up Information    Follow up with Remus Loffler, PA. Schedule an appointment as soon as possible for a visit in 1 week.   Specialty:  Physician Assistant   Contact information:   [address] [place] [address] [phone]       Follow up with Chrystie Nose, MD. Schedule an appointment as soon as possible for a visit in 1 week.   Specialty:  Cardiology   Contact information:   [address] [place] [address] [phone]        Major procedures and Radiology Reports - PLEASE review detailed and final reports for all details, in brief -    Bilateral lower extremity venous duplex completed. Preliminary report: Bilateral: No evidence of acute DVT, superficial thrombosis, or Baker's Cyst. Non occlusive chronic appearing thrombosis noted in the CFV. Occlusive chronic thrombus in the FV with collateral venous flow noted.  CT Head - Non acute  CT abdomen pelvis. Question ascending colon abnormality.  TTE Left ventricle: The cavity size was normal. Wall thickness was normal. Systolic function was severely reduced. The estimated ejection fraction was 20%. Diffuse hypokinesis. Doppler  parameters are consistent with abnormal left ventricular relaxation (grade 1 diastolic dysfunction).  Impressions: - Technically difficult; severe global reduction in LV function; grade 1 diastolic dysfunction.     Dg Chest 2 View  01/02/2016  CLINICAL DATA:  75 year old male with failure to thrive EXAM: CHEST  2 VIEW COMPARISON:  Chest radiograph dated 06/10/2014 FINDINGS: Two views of the chest demonstrate emphysematous changes of the lungs. There is no focal consolidation, pleural effusion, or pneumothorax. The cardiac silhouette is within normal limits. Median sternotomy wires and CABG vascular clips noted. The bones are osteopenic. No acute fracture. IMPRESSION: No active cardiopulmonary disease. Electronically Signed   By: Elgie Collard M.D.   On: 01/02/2016 21:19   Ct Head Wo Contrast  01/02/2016  CLINICAL DATA:  75 year old male with altered mental status. EXAM: CT HEAD WITHOUT CONTRAST TECHNIQUE: Contiguous axial images were obtained from the base of the skull through the vertex without intravenous contrast. COMPARISON:  Head CT dated 06/11/2014 FINDINGS: There is moderate age-related atrophy and chronic microvascular ischemic changes. No acute intracranial hemorrhage. No mass effect or midline shift. The visualized paranasal sinuses and mastoid air cells are clear. The calvarium is intact. IMPRESSION: No acute intracranial hemorrhage. Age-related atrophy and chronic microvascular ischemic disease. If symptoms persist and there are no contraindications, MRI may provide better evaluation if clinically indicated. Electronically Signed   By: Elgie Collard M.D.   On: 01/02/2016 22:59   Ct Abdomen Pelvis W  Contrast  01/03/2016  CLINICAL DATA:  75 year old male with GI bleed and anemia EXAM: CT ABDOMEN AND PELVIS WITH CONTRAST TECHNIQUE: Multidetector CT imaging of the abdomen and pelvis was performed using the standard protocol following bolus administration of intravenous contrast. CONTRAST:   ISOVUE-300 IOPAMIDOL (ISOVUE-300) INJECTION 61% COMPARISON:  None. FINDINGS: Lower chest: Small right and trace left pleural effusions with associated lower lobe atelectasis. Additionally, there is mild calcification along the pleural margin and terminal bronchioles in the atelectatic lower lobes. Diffuse mild bronchial wall thickening. Tree-in-bud micro nodularity in the periphery of the left lower lobe. The heart is normal in size. No pericardial effusion. Hepatobiliary: No discrete hepatic lesion. Although the gallbladder is largely decompressed, the wall appears somewhat thickened and there is trace pericholecystic fluid. No intra or extrahepatic biliary ductal dilatation Pancreas: No mass, inflammatory changes, or other significant abnormality. Spleen: Within normal limits for size. Nonspecific 1.6 cm low-attenuation lesion in the posterior aspect of the upper spleen. Adrenals/Urinary Tract: Thickening versus a nodularity of the superior aspect of both adrenal glands measuring up to 1.6 cm on the right and 1.6 cm on the left. No evidence of hydronephrosis, nephrolithiasis or enhancing renal mass. Stomach/Bowel: Focal region of abnormal and irregular submucosal thickening involving the ascending colon along the length of approximately 7.3 cm. There is interstitial stranding in the adjacent pericolonic fat. No definite associated adenopathy. Vascular/Lymphatic: No pathologically enlarged lymph nodes. No evidence of abdominal aortic aneurysm. Scattered atherosclerotic calcifications are present throughout the abdominal aorta and branch vessels. Reproductive: No mass or other significant abnormality. Other: None. Musculoskeletal:  No suspicious bone lesions identified. IMPRESSION: 1. Proximally 7.3 cm long region of irregular submucosal thickening involving the ascending colon with associated interstitial stranding in the pericolonic fat. Differential considerations include focal colitis (infectious or  inflammatory) and colonic neoplasm. No definite mesenteric adenopathy or evidence of metastatic disease to favor malignancy. 2. Small volume free fluid in the right lower quadrant almost certainly related to the colonic process described above. 3. Nonspecific gallbladder wall thickening and trace pericholecystic fluid without associated gallbladder distension or visible cholelithiasis. Differential considerations include both acute and chronic cholecystitis as well as hypoalbuminemia and intrinsic liver disease. 4. Nonspecific bilateral adrenal gland nodularity. Although imaging is not definitive, adenomas or benign adrenal hyperplasia are favored. 5. Small right and trace left pleural effusions with associated left lower lobe atelectasis. 6. Diffuse mild bronchial wall thickening with tree-in-bud micro nodularity in the periphery of the left lower lobe likely reflecting a remote infectious/inflammatory process. 7. Nonspecific 1.6 cm low-attenuation lesion in the posterior aspect of the spleen. This likely represents a small cyst, hemangioma or other benign lesion. 8.  Aortic Atherosclerosis (ICD10-170.0) Electronically Signed   By: Malachy Moan M.D.   On: 01/03/2016 13:05    Micro Results      Recent Results (from the past 240 hour(s))  MRSA PCR Screening     Status: None   Collection Time: 01/03/16 12:15 AM  Result Value Ref Range Status   MRSA by PCR NEGATIVE NEGATIVE Final    Comment:        The GeneXpert MRSA Assay (FDA approved for NASAL specimens only), is one component of a comprehensive MRSA colonization surveillance program. It is not intended to diagnose MRSA infection nor to guide or monitor treatment for MRSA infections.     Today   Subjective    Kiko Ripp today has no headache,no chest abdominal pain,no new weakness tingling or numbness, feels much better wants  to go home today.     Objective   Blood pressure 116/60, pulse 64, temperature 97.8 F (36.6 C),  temperature source Oral, resp. rate 18, height 6' (1.829 m), weight 81 kg (178 lb 9.2 oz), SpO2 98 %.   Intake/Output Summary (Last 24 hours) at 01/05/16 0949 Last data filed at 01/05/16 0439  Gross per 24 hour  Intake   1820 ml  Output   1050 ml  Net    770 ml    Exam Awake, pleasantly confused, No new F.N deficits, Normal affect [place].AT,PERRAL Supple Neck,No JVD, No cervical lymphadenopathy appriciated.  Symmetrical Chest wall movement, Good air movement bilaterally, CTAB RRR,No Gallops,Rubs or new Murmurs, No Parasternal Heave +ve B.Sounds, Abd Soft, Non tender, No organomegaly appriciated, No rebound -guarding or rigidity. No Cyanosis, Clubbing or edema, No new Rash or bruise   Data Review   CBC w Diff:  Lab Results  Component Value Date   WBC 8.3 01/03/2016   HGB 8.4* 01/04/2016   HCT 27.8* 01/04/2016   PLT 355 01/03/2016   LYMPHOPCT 14 01/02/2016   MONOPCT 10 01/02/2016   EOSPCT 1 01/02/2016   BASOPCT 0 01/02/2016    CMP:  Lab Results  Component Value Date   NA 138 01/04/2016   K 4.0 01/04/2016   CL 110 01/04/2016   CO2 23 01/04/2016   BUN 8 01/04/2016   CREATININE 0.70 01/04/2016   CREATININE 1.04 07/01/2014   PROT 6.0* 01/02/2016   ALBUMIN 2.4* 01/02/2016   BILITOT 0.6 01/02/2016   ALKPHOS 73 01/02/2016   AST 14* 01/02/2016   ALT 7* 01/02/2016  .   Total Time in preparing paper work, data evaluation and todays exam - 35 minutes  Leroy SeaSINGH,Princesa Willig K M.D on 01/05/2016 at 9:49 AM  [name]   Office  [phone]

## 2016-01-05 NOTE — Progress Notes (Signed)
[facility] to [facility]. Roy Hickman, Roy Hickman

## 2016-01-05 NOTE — Clinical Social Work Placement (Signed)
CSW spoke with patient's niece, Roy Hickman who requested [facility] for [facility]. CS[facility] with [facility] that they are able to offer a bed.   [facility]. Patient & niece, Roy Hickman aware. Discharge packet given to RN, Roy Hickman. [facility] called for transport.     Roy MaxinKelly Delbert Darley, LCSW [facility] Clinical Social Worker cell #: [phone]    CLINICAL SOCIAL WORK PLACEMENT  NOTE  Date:  01/05/2016  Patient Details  Name: Roy Hickman MRN: [id number] Date of Birth: February 07, 1941  Clinical Social Work is seeking post-discharge placement for this patient at the Skilled  Nursing Facility level of care (*CSW will initial, date and re-position this form in  chart as items are completed):  Yes   Patient/family provided with [facility] Clinical Social Work Department's list of facilities offering this level of care within the geographic area requested by the patient (or if unable, by the patient's family).  Yes   Patient/family informed of their freedom to choose among providers that offer the needed level of care, that participate in Medicare, Medicaid or managed care program needed by the patient, have an available bed and are willing to accept the patient.  Yes   Patient/family informed of [facility]'s ownership interest in [facility] and [facility], as well as of the fact that they are under no obligation to receive care at these facilities.  PASRR submitted to EDS on       PASRR number received on       Existing PASRR number confirmed on 01/04/16     FL2 transmitted to all facilities in geographic area requested by pt/family on 01/04/16     FL2 transmitted to all facilities within larger geographic area on       Patient informed that his/her managed care company has contracts with or will negotiate with certain facilities, including the following:        Yes   Patient/family informed of bed offers  received.  [facility]     Physician recommends and patient chooses bed at      [facility] 01/05/16.  Patient to be transferred to facility by [facility]     Patient family notified on 01/05/16 of transfer.  Name of family member notified:  patient's niece, Roy Hickman via phone     PHYSICIAN       Additional Comment:    _______________________________________________ Arlyss RepressHarrison, Patrisha Hausmann F, LCSW 01/05/2016, 11:34 AM

## 2016-05-03 ENCOUNTER — Emergency Department (HOSPITAL_COMMUNITY)
Admission: EM | Admit: 2016-05-03 | Discharge: 2016-05-04 | Disposition: A | Discharge status: Home / Self Care | Payer: Medicare Other | Attending: Emergency Medicine | Admitting: Emergency Medicine

## 2016-05-03 ENCOUNTER — Encounter (HOSPITAL_COMMUNITY): Payer: Self-pay | Admitting: Emergency Medicine

## 2016-05-03 DIAGNOSIS — R799 Abnormal finding of blood chemistry, unspecified: Secondary | ICD-10-CM | POA: Diagnosis present

## 2016-05-03 DIAGNOSIS — I110 Hypertensive heart disease with heart failure: Secondary | ICD-10-CM | POA: Insufficient documentation

## 2016-05-03 DIAGNOSIS — D649 Anemia, unspecified: Secondary | ICD-10-CM

## 2016-05-03 DIAGNOSIS — E119 Type 2 diabetes mellitus without complications: Secondary | ICD-10-CM | POA: Insufficient documentation

## 2016-05-03 DIAGNOSIS — Z87891 Personal history of nicotine dependence: Secondary | ICD-10-CM | POA: Diagnosis not present

## 2016-05-03 DIAGNOSIS — Z951 Presence of aortocoronary bypass graft: Secondary | ICD-10-CM | POA: Diagnosis not present

## 2016-05-03 DIAGNOSIS — E039 Hypothyroidism, unspecified: Secondary | ICD-10-CM | POA: Insufficient documentation

## 2016-05-03 DIAGNOSIS — I2510 Atherosclerotic heart disease of native coronary artery without angina pectoris: Secondary | ICD-10-CM | POA: Diagnosis not present

## 2016-05-03 DIAGNOSIS — D591 Other autoimmune hemolytic anemias: Secondary | ICD-10-CM | POA: Insufficient documentation

## 2016-05-03 DIAGNOSIS — Z79899 Other long term (current) drug therapy: Secondary | ICD-10-CM | POA: Diagnosis not present

## 2016-05-03 DIAGNOSIS — I5042 Chronic combined systolic (congestive) and diastolic (congestive) heart failure: Secondary | ICD-10-CM | POA: Insufficient documentation

## 2016-05-03 DIAGNOSIS — I252 Old myocardial infarction: Secondary | ICD-10-CM | POA: Diagnosis not present

## 2016-05-03 LAB — CBC WITH DIFFERENTIAL/PLATELET
Basophils Absolute: 0 10*3/uL (ref 0.0–0.1)
Basophils Relative: 0 %
EOS PCT: 1 %
Eosinophils Absolute: 0.1 10*3/uL (ref 0.0–0.7)
HCT: 23.4 % — ABNORMAL LOW (ref 39.0–52.0)
Hemoglobin: 6.7 g/dL — CL (ref 13.0–17.0)
LYMPHS ABS: 1.7 10*3/uL (ref 0.7–4.0)
Lymphocytes Relative: 20 %
MCH: 20.6 pg — ABNORMAL LOW (ref 26.0–34.0)
MCHC: 28.6 g/dL — AB (ref 30.0–36.0)
MCV: 72 fL — AB (ref 78.0–100.0)
MONO ABS: 0.9 10*3/uL (ref 0.1–1.0)
Monocytes Relative: 10 %
NEUTROS ABS: 5.8 10*3/uL (ref 1.7–7.7)
Neutrophils Relative %: 69 %
PLATELETS: 365 10*3/uL (ref 150–400)
RBC: 3.25 MIL/uL — AB (ref 4.22–5.81)
RDW: 16.8 % — AB (ref 11.5–15.5)
WBC: 8.5 10*3/uL (ref 4.0–10.5)

## 2016-05-03 LAB — PROTIME-INR
INR: 1.14
Prothrombin Time: 14.7 seconds (ref 11.4–15.2)

## 2016-05-03 LAB — BASIC METABOLIC PANEL
Anion gap: 5 (ref 5–15)
BUN: 16 mg/dL (ref 6–20)
CHLORIDE: 108 mmol/L (ref 101–111)
CO2: 26 mmol/L (ref 22–32)
CREATININE: 0.82 mg/dL (ref 0.61–1.24)
Calcium: 8.5 mg/dL — ABNORMAL LOW (ref 8.9–10.3)
GFR calc Af Amer: >60 mL/min (ref >60)
GLUCOSE: 175 mg/dL — AB (ref 65–99)
Potassium: 4.3 mmol/L (ref 3.5–5.1)
SODIUM: 139 mmol/L (ref 135–145)

## 2016-05-03 LAB — PREPARE RBC (CROSSMATCH)

## 2016-05-03 MED ORDER — SODIUM CHLORIDE 0.9 % IV SOLN
10.0000 mL/h | Freq: Once | INTRAVENOUS | Status: AC
  Administered 2016-05-03: 10 mL/h via INTRAVENOUS

## 2016-05-03 NOTE — ED Triage Notes (Signed)
Per EMS: Pt is here for blood transfusion, [facility]

## 2016-05-03 NOTE — ED Notes (Signed)
POC occult negative, per mini lab.

## 2016-05-03 NOTE — ED Provider Notes (Signed)
[facility] DEPT Provider Note   CSN: [id number] Arrival date & time: 05/03/16  1707     History   Chief Complaint No chief complaint on file.   HPI Roy Hickman is a 75 y.o. male.  HPI  Patient presents from his nursing facility. Patient has dementia, level V caveat The patient answers questions briefly, properly, but has no understanding of why he is here. He denies pain, dyspnea, discomfort.  Per report the patient is a nursing facility, had a blood draw last week that was notable for hemoglobin of less than 7. No report of hematemesis, melena, bright red blood per rectum. No report of changes in behavior, fall or No report of new medications.   Past Medical History:  Diagnosis Date  . Chronic combined systolic and diastolic congestive heart failure (HCC)   . Coronary artery disease   . Dementia   . Diabetes mellitus   . GERD (gastroesophageal reflux disease)   . Hyperlipidemia   . Hypertension   . Left leg DVT (HCC)   . MI (mitral incompetence)   . Myocardial infarction 2007   s/p CABG     Patient Active Problem List   Diagnosis Date Noted  . Abdominal pain, generalized   . Encounter for palliative care   . Goals of care, counseling/discussion   . Hypothyroidism 01/03/2016  . Hypokalemia 01/03/2016  . Pressure ulcer 01/03/2016  . Anemia due to GI blood loss   . Bleeding on Coumadin   . Supratherapeutic INR   . Chronic combined systolic and diastolic congestive heart failure (HCC)   . GIB (gastrointestinal bleeding) 01/02/2016  . Failure to thrive (0-17) 01/02/2016  . Chronic anticoagulation   . Elevated troponin 06/11/2014  . Faintness   . Syncope 06/10/2014  . Dementia 06/10/2014  . Dyslipidemia 02/19/2013  . Long term current use of anticoagulant therapy 09/22/2012  . DVT of leg (deep venous thrombosis), unspecified laterality 09/22/2012  . Encephalopathy 08/20/2011  . Urinary retention 08/20/2011  . Urosepsis 08/13/2011  . Acute renal  failure (HCC) 08/13/2011  . DVT of leg (deep venous thrombosis) (HCC) 08/13/2011  . Anemia 08/13/2011  . Difficulty walking 05/11/2011  . 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 Myoview low risk 1/13 05/11/2011  . DM2 (diabetes mellitus, type 2) (HCC) 05/11/2011  . HTN (hypertension) 05/11/2011  . Low back pain 05/11/2011    Past Surgical History:  Procedure Laterality Date  . BACK SURGERY    . CERVICAL SPINE SURGERY    . CORONARY ARTERY BYPASS GRAFT  2007   LIMA to his LAD, and vein to a diagonal branch and PDA. His EF at that time was 35% to 40%.   . ELBOW SURGERY     left  . LAMINECTOMY    . POSTERIOR CERVICAL FUSION/FORAMINOTOMY  07/21/2011   Procedure: POSTERIOR CERVICAL FUSION/FORAMINOTOMY LEVEL 5;  Surgeon: Karn Cassis, MD;  Location: [facility];  Service: Neurosurgery;  Laterality: N/A;  Cervical three to Thoracic one Laminectomy, posterolateral arthrodesis, with Lateral Mass screws       Home Medications    Prior to Admission medications   Medication Sig Start Date End Date Taking? Authorizing Provider  acetaminophen (TYLENOL) 500 MG tablet Take 500 mg by mouth every 6 (six) hours as needed for moderate pain or headache.    Historical Provider, MD  acetaminophen-codeine (TYLENOL #3) 300-30 MG per tablet Take 1 tablet by mouth every 8 (eight) hours as needed for moderate pain.  06/24/14   Historical Provider,  MD  atorvastatin (LIPITOR) 40 MG tablet Take 40 mg by mouth at bedtime.     Historical Provider, MD  DULoxetine (CYMBALTA) 60 MG capsule Take 60 mg by mouth daily.    Historical Provider, MD  folic acid ([name]) 1 MG tablet Take 1 mg by mouth daily.     Historical Provider, MD  hydrOXYzine ([name]) 25 MG tablet Take 25 mg by mouth every 8 (eight) hours as needed for anxiety or itching.    Historical Provider, MD  levothyroxine (SYNTHROID, LEVOTHROID) 25 MCG tablet Take 25 mcg by mouth daily before breakfast.    Historical Provider, MD  lisinopril ([name],[name]) 2.5  MG tablet Take 1 tablet (2.5 mg total) by mouth daily. 01/05/16   Leroy SeaPrashant K Singh, MD  metoprolol tartrate (LOPRESSOR) 25 MG tablet Take 1 tablet (25 mg total) by mouth 2 (two) times daily. 01/05/16   Leroy SeaPrashant K Singh, MD  mupirocin ointment (BACTROBAN) 2 % Apply 1 application topically 3 (three) times daily as needed (but sores).     Historical Provider, MD  nystatin-triamcinolone (MYCOLOG II) cream Apply 1 application topically as needed (skin irritation).  12/18/12   Historical Provider, MD  omeprazole (PRILOSEC) 40 MG capsule Take 1 capsule (40 mg total) by mouth daily. 01/05/16   Leroy SeaPrashant K Singh, MD  QUEtiapine (SEROQUEL) 50 MG tablet Take 50 mg by mouth 2 (two) times daily.  01/24/13   Historical Provider, MD  tamsulosin (FLOMAX) 0.4 MG CAPS capsule Take 1 capsule (0.4 mg total) by mouth daily. 07/02/14   Jodelle GrossKathryn M Lawrence, NP    Family History Family History  Problem Relation Age of Onset  . Heart failure Father   . Heart failure Sister     Social History Social History  Substance Use Topics  . Smoking status: Former Smoker    Packs/day: 2.00    Years: 25.00    Types: Cigarettes    Start date: 04/01/1952    Quit date: 02/27/1987  . Smokeless tobacco: Never Used  . Alcohol use No     Comment: quit drinking in 1984     Allergies   Review of patient's allergies indicates no known allergies.   Review of Systems Review of Systems  Unable to perform ROS: Dementia     Physical Exam Updated Vital Signs BP 131/88   Pulse 78   Temp 98.6 F (37 C) (Oral)   Resp 16   Ht 6' (1.829 m)   Wt 178 lb (80.7 kg)   SpO2 99%   BMI 24.14 kg/m   Physical Exam  Constitutional: He appears well-developed. No distress.  HENT:  Head: Normocephalic and atraumatic.  Eyes: Conjunctivae and EOM are normal.  Cardiovascular: Normal rate and regular rhythm.   Pulmonary/Chest: Effort normal. No stridor. No respiratory distress.  Abdominal: He exhibits no distension.  Musculoskeletal: He  exhibits no edema.  Neurological: He is alert. He is disoriented. He displays atrophy. He displays no tremor. No cranial nerve deficit. He exhibits normal muscle tone. He displays no seizure activity.  Skin: Skin is warm and dry. There is pallor.  Psychiatric: He is slowed and withdrawn. Cognition and memory are impaired.  Nursing note and vitals reviewed.    ED Treatments / Results  Labs (all labs ordered are listed, but only abnormal results are displayed) Labs Reviewed  BASIC METABOLIC PANEL - Abnormal; Notable for the following:       Result Value   Glucose, Bld 175 (*)    Calcium 8.5 (*)  All other components within normal limits  CBC WITH DIFFERENTIAL/PLATELET - Abnormal; Notable for the following:    RBC 3.25 (*)    Hemoglobin 6.7 (*)    HCT 23.4 (*)    MCV 72.0 (*)    MCH 20.6 (*)    MCHC 28.6 (*)    RDW 16.8 (*)    All other components within normal limits  PROTIME-INR  POC OCCULT BLOOD, ED  TYPE AND SCREEN  PREPARE RBC (CROSSMATCH)   Hemoccult negative   Procedures Procedures (including critical care time)  Medications Ordered in ED Medications  0.9 %  sodium chloride infusion (10 mL/hr Intravenous New Bag/Given 05/03/16 1917)  RBC   Initial Impression / Assessment and Plan / ED Course  I have reviewed the triage vital signs and the nursing notes.  Pertinent labs & imaging results that were available during my care of the patient were reviewed by me and considered in my medical decision making (see chart for details).  Clinical Course    11:44 PM Patient has completed his blood transfusion, and aside from one nonsustained episode of multiple ventricular beats, has had no notable change in his condition.   Final Clinical Impressions(s) / ED Diagnoses  Elderly male with dementia presents from nursing facility with concern for pallor, anemia. Here the patient is awake alert, has Hemoccult-negative stool, but does have critically low hemoglobin  level. Patient's baseline hemoglobin level is between 7 and 8-1/2. Today the patient started hemoglobin level of 6.3. Patient received blood transfusion, uncomplicated, but requiring several hours of monitoring. Patient tolerated this well, and with no notable change, was discharged in stable condition back to his facility.   CRITICAL CARE Performed by: Gerhard MunchLOCKWOOD, Loren Vicens Total critical care time: 35 minutes Critical care time was exclusive of separately billable procedures and treating other patients. Critical care was necessary to treat or prevent imminent or life-threatening deterioration. Critical care was time spent personally by me on the following activities: development of treatment plan with patient and/or surrogate as well as nursing, discussions with consultants, evaluation of patient's response to treatment, examination of patient, obtaining history from patient or surrogate, ordering and performing treatments and interventions, ordering and review of laboratory studies, ordering and review of radiographic studies, pulse oximetry and re-evaluation of patient's condition.    Gerhard Munchobert Sherre Wooton, MD 05/03/16 [phone]

## 2016-05-04 DIAGNOSIS — D591 Other autoimmune hemolytic anemias: Secondary | ICD-10-CM | POA: Diagnosis not present

## 2016-05-04 LAB — TYPE AND SCREEN
ABO/RH(D): O POS
Antibody Screen: NEGATIVE
UNIT DIVISION: 0

## 2016-05-04 NOTE — Discharge Instructions (Signed)
Please be sure to have your hemoglobin level checked again within the week.  Return here for concerning changes in your condition.

## 2016-05-06 ENCOUNTER — Encounter (HOSPITAL_COMMUNITY): Payer: Self-pay

## 2016-05-06 ENCOUNTER — Emergency Department (HOSPITAL_COMMUNITY)
Admission: EM | Admit: 2016-05-06 | Discharge: 2016-05-06 | Disposition: A | Discharge status: Home / Self Care | Payer: Medicare Other | Attending: Emergency Medicine | Admitting: Emergency Medicine

## 2016-05-06 ENCOUNTER — Emergency Department (HOSPITAL_COMMUNITY): Payer: Medicare Other

## 2016-05-06 DIAGNOSIS — Y92129 Unspecified place in nursing home as the place of occurrence of the external cause: Secondary | ICD-10-CM | POA: Diagnosis not present

## 2016-05-06 DIAGNOSIS — S0990XA Unspecified injury of head, initial encounter: Secondary | ICD-10-CM | POA: Diagnosis present

## 2016-05-06 DIAGNOSIS — W19XXXA Unspecified fall, initial encounter: Secondary | ICD-10-CM | POA: Insufficient documentation

## 2016-05-06 DIAGNOSIS — I110 Hypertensive heart disease with heart failure: Secondary | ICD-10-CM | POA: Diagnosis not present

## 2016-05-06 DIAGNOSIS — Y939 Activity, unspecified: Secondary | ICD-10-CM | POA: Insufficient documentation

## 2016-05-06 DIAGNOSIS — I5042 Chronic combined systolic (congestive) and diastolic (congestive) heart failure: Secondary | ICD-10-CM | POA: Insufficient documentation

## 2016-05-06 DIAGNOSIS — Z23 Encounter for immunization: Secondary | ICD-10-CM | POA: Diagnosis not present

## 2016-05-06 DIAGNOSIS — Y999 Unspecified external cause status: Secondary | ICD-10-CM | POA: Insufficient documentation

## 2016-05-06 DIAGNOSIS — I2510 Atherosclerotic heart disease of native coronary artery without angina pectoris: Secondary | ICD-10-CM | POA: Diagnosis not present

## 2016-05-06 DIAGNOSIS — I252 Old myocardial infarction: Secondary | ICD-10-CM | POA: Diagnosis not present

## 2016-05-06 DIAGNOSIS — S0083XA Contusion of other part of head, initial encounter: Secondary | ICD-10-CM | POA: Diagnosis not present

## 2016-05-06 DIAGNOSIS — Z87891 Personal history of nicotine dependence: Secondary | ICD-10-CM | POA: Insufficient documentation

## 2016-05-06 DIAGNOSIS — Z951 Presence of aortocoronary bypass graft: Secondary | ICD-10-CM | POA: Insufficient documentation

## 2016-05-06 DIAGNOSIS — E039 Hypothyroidism, unspecified: Secondary | ICD-10-CM | POA: Insufficient documentation

## 2016-05-06 DIAGNOSIS — E119 Type 2 diabetes mellitus without complications: Secondary | ICD-10-CM | POA: Diagnosis not present

## 2016-05-06 DIAGNOSIS — Y92009 Unspecified place in unspecified non-institutional (private) residence as the place of occurrence of the external cause: Secondary | ICD-10-CM

## 2016-05-06 MED ORDER — TETANUS-DIPHTH-ACELL PERTUSSIS 5-2.5-18.5 LF-MCG/0.5 IM SUSP
0.5000 mL | Freq: Once | INTRAMUSCULAR | Status: AC
  Administered 2016-05-06: 0.5 mL via INTRAMUSCULAR
  Filled 2016-05-06: qty 0.5

## 2016-05-06 NOTE — ED Notes (Signed)
Attempted to call report to facility x3 no answer

## 2016-05-06 NOTE — ED Provider Notes (Signed)
[facility] Provider Note   CSN: [id number] Arrival date & time: 05/06/16  2011     History   Chief Complaint Chief Complaint  Patient presents with  . Fall    HPI CLEBURNE SAVINI is a 75 y.o. male.  He is at an assisted living facility and was found on the floor after an unwitnessed fall. Patient does not recall falling. He has an abrasion on his forehead. Review of his MAR shows he is not on any anticoagulants or antiplatelet agents.   The history is provided by the nursing home.  Fall     Past Medical History:  Diagnosis Date  . Chronic combined systolic and diastolic congestive heart failure (HCC)   . Coronary artery disease   . Dementia   . Diabetes mellitus   . GERD (gastroesophageal reflux disease)   . Hyperlipidemia   . Hypertension   . Left leg DVT (HCC)   . MI (mitral incompetence)   . Myocardial infarction 2007   s/p CABG     Patient Active Problem List   Diagnosis Date Noted  . Abdominal pain, generalized   . Encounter for palliative care   . Goals of care, counseling/discussion   . Hypothyroidism 01/03/2016  . Hypokalemia 01/03/2016  . Pressure ulcer 01/03/2016  . Anemia due to GI blood loss   . Bleeding on Coumadin   . Supratherapeutic INR   . Chronic combined systolic and diastolic congestive heart failure (HCC)   . GIB (gastrointestinal bleeding) 01/02/2016  . Failure to thrive (0-17) 01/02/2016  . Chronic anticoagulation   . Elevated troponin 06/11/2014  . Faintness   . Syncope 06/10/2014  . Dementia 06/10/2014  . Dyslipidemia 02/19/2013  . Long term current use of anticoagulant therapy 09/22/2012  . DVT of leg (deep venous thrombosis), unspecified laterality 09/22/2012  . Encephalopathy 08/20/2011  . Urinary retention 08/20/2011  . Urosepsis 08/13/2011  . Acute renal failure (HCC) 08/13/2011  . DVT of leg (deep venous thrombosis) (HCC) 08/13/2011  . Anemia 08/13/2011  . Difficulty walking 05/11/2011  . 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  Myoview low risk 1/13 05/11/2011  . DM2 (diabetes mellitus, type 2) (HCC) 05/11/2011  . HTN (hypertension) 05/11/2011  . Low back pain 05/11/2011    Past Surgical History:  Procedure Laterality Date  . BACK SURGERY    . CERVICAL SPINE SURGERY    . CORONARY ARTERY BYPASS GRAFT  2007   LIMA to his LAD, and vein to a diagonal branch and PDA. His EF at that time was 35% to 40%.   . ELBOW SURGERY     left  . LAMINECTOMY    . POSTERIOR CERVICAL FUSION/FORAMINOTOMY  07/21/2011   Procedure: POSTERIOR CERVICAL FUSION/FORAMINOTOMY LEVEL 5;  Surgeon: Karn Cassis, MD;  Location: [facility];  Service: Neurosurgery;  Laterality: N/A;  Cervical three to Thoracic one Laminectomy, posterolateral arthrodesis, with Lateral Mass screws       Home Medications    Prior to Admission medications   Medication Sig Start Date End Date Taking? Authorizing Provider  acetaminophen (TYLENOL) 500 MG tablet Take 500 mg by mouth every 6 (six) hours as needed for headache (or pain).     Historical Provider, MD  acetaminophen-codeine (TYLENOL #3) 300-30 MG per tablet Take 1 tablet by mouth every 8 (eight) hours as needed (for pain).  06/24/14   Historical Provider, MD  Amino Acids-Protein Hydrolys (PRO-STAT AWC) LIQD Take 60 mLs by mouth every morning.    Historical Provider,  MD  atorvastatin (LIPITOR) 40 MG tablet Take 40 mg by mouth at bedtime.     Historical Provider, MD  Cholecalciferol (VITAMIN D-3) 1000 units CAPS Take 2,000 Units by mouth every morning.    Historical Provider, MD  DULoxetine (CYMBALTA) 60 MG capsule Take 60 mg by mouth daily.    Historical Provider, MD  folic acid ([name]) 400 MCG tablet Take 800 mcg by mouth daily.    Historical Provider, MD  hydrOXYzine ([name]) 25 MG tablet Take 25 mg by mouth every 8 (eight) hours as needed for anxiety.     Historical Provider, MD  levothyroxine (SYNTHROID, LEVOTHROID) 25 MCG tablet Take 25 mcg by mouth daily before breakfast.    Historical  Provider, MD  lisinopril ([name],[name]) 2.5 MG tablet Take 1 tablet (2.5 mg total) by mouth daily. 01/05/16   Leroy SeaPrashant K Singh, MD  metoprolol tartrate (LOPRESSOR) 25 MG tablet Take 1 tablet (25 mg total) by mouth 2 (two) times daily. 01/05/16   Leroy SeaPrashant K Singh, MD  Multiple Vitamin (DAILY VITE) TABS Take 1 tablet by mouth daily.    Historical Provider, MD  mupirocin ointment (BACTROBAN) 2 % Apply 1 application topically 3 (three) times daily as needed (but sores).     Historical Provider, MD  nystatin-triamcinolone (MYCOLOG II) cream Apply 1 application topically as needed (skin irritation).  12/18/12   Historical Provider, MD  omeprazole (PRILOSEC) 40 MG capsule Take 1 capsule (40 mg total) by mouth daily. 01/05/16   Leroy SeaPrashant K Singh, MD  QUEtiapine (SEROQUEL) 50 MG tablet Take 25 mg by mouth 2 (two) times daily.  01/24/13   Historical Provider, MD  tamsulosin (FLOMAX) 0.4 MG CAPS capsule Take 1 capsule (0.4 mg total) by mouth daily. 07/02/14   Jodelle GrossKathryn M Lawrence, NP  vitamin C (ASCORBIC ACID) 500 MG tablet Take 500 mg by mouth daily.    Historical Provider, MD  Zinc Sulfate (ZINC-220 PO) Take 220 mg by mouth every morning.    Historical Provider, MD    Family History Family History  Problem Relation Age of Onset  . Heart failure Father   . Heart failure Sister     Social History Social History  Substance Use Topics  . Smoking status: Former Smoker    Packs/day: 2.00    Years: 25.00    Types: Cigarettes    Start date: 04/01/1952    Quit date: 02/27/1987  . Smokeless tobacco: Never Used  . Alcohol use No     Comment: quit drinking in 1984     Allergies   Review of patient's allergies indicates no known allergies.   Review of Systems Review of Systems  Unable to perform ROS: Dementia     Physical Exam Updated Vital Signs BP 141/72   Pulse 80   Temp 98.4 F (36.9 C) (Oral)   Resp 15   SpO2 100%   Physical Exam  Nursing note and vitals reviewed.  75 year old male,  resting comfortably and in no acute distress. Vital signs are significant for borderline hypertension. Oxygen saturation is 100%, which is normal. Head is normocephalic. Minor abrasion an small hematoma are present on the right side of the forehead. PERRLA, EOMI. Oropharynx is clear. Neck is nontender without adenopathy or JVD. Back is nontender and there is no CVA tenderness. Lungs are clear without rales, wheezes, or rhonchi. Chest is nontender. Heart has regular rate and rhythm without murmur. Abdomen is soft, flat, nontender without masses or hepatosplenomegaly and peristalsis is normoactive. Extremities have  no cyanosis or edema, full range of motion is present. Skin is warm and dry without rash. Neurologic: He is awake and alert and oriented to person and partially oriented to place but not oriented to time, cranial nerves are intact, there are no motor or sensory deficits.  ED Treatments / Results   Radiology Ct Head Wo Contrast  Result Date: 05/06/2016 CLINICAL DATA:  Confused, fall at nursing home with cut on right side of forehead EXAM: CT HEAD WITHOUT CONTRAST CT CERVICAL SPINE WITHOUT CONTRAST TECHNIQUE: Multidetector CT imaging of the head and cervical spine was performed following the standard protocol without intravenous contrast. Multiplanar CT image reconstructions of the cervical spine were also generated. COMPARISON:  01/02/2016, 05/26/2011 FINDINGS: CT HEAD FINDINGS Brain: No acute territorial infarction, intracranial hemorrhage or focal mass lesion. No midline shift. Moderate-to-marked periventricular and subcortical white matter hypodensities, felt consistent with small vessel disease. Moderate global atrophy. There is cerebellar atrophy. Vascular: No hyperdense vessels. There are carotid artery calcifications. There is vertebral basilar calcification. Skull: Mastoid air cells demonstrate right-sided sclerosis. There is no skull fracture seen. Sinuses/Orbits: No acute orbital  abnormality. Bilateral lens extraction. Mucosal thickening in the ethmoid and maxillary sinuses. Other: None CT CERVICAL SPINE FINDINGS Alignment: Patient is status post anterior cervical plate and screw fixation from C3 to upper aspect of C6. There is also posterior stabilization rod and screw fixation from C3 through C7. No gross subluxation is seen. Skull base and vertebrae: Cranial vertebral junction appears intact. Again visualized are partial corpectomy changes of C4 and C5 with placement of obliquely oriented graft from C3 through C6. Interval laminectomy changes from C3 through C7. No gross fracture is seen allowing for extensive postsurgical changes. Soft tissues and spinal canal: Prevertebral soft tissue thickness appears within normal limits. Carotid artery calcifications. Protrusion of the cranial end of the graft at C3 into the anterior canal is as before. Disc levels: There is fusion of the C3 through C7 vertebral bodies.Marked hyper trophic facet changes bilaterally with multilevel foraminal stenosis present. Upper chest: Lung apices are clear. Imaged thyroid gland within normal limits. Other: None IMPRESSION: 1. No CT evidence for acute intracranial abnormality. Moderate-to-marked periventricular and subcortical white matter hypodensities fell consistent with small vessel disease. 2. Extensive postsurgical changes of the cervical spine from C3 through C7. No gross fracture or malalignment allowing for significant surgical changes. 3. Carotid artery calcifications. Electronically Signed   By: Jasmine Pang M.D.   On: 05/06/2016 21:42   Ct Cervical Spine Wo Contrast  Result Date: 05/06/2016 CLINICAL DATA:  Confused, fall at nursing home with cut on right side of forehead EXAM: CT HEAD WITHOUT CONTRAST CT CERVICAL SPINE WITHOUT CONTRAST TECHNIQUE: Multidetector CT imaging of the head and cervical spine was performed following the standard protocol without intravenous contrast. Multiplanar CT image  reconstructions of the cervical spine were also generated. COMPARISON:  01/02/2016, 05/26/2011 FINDINGS: CT HEAD FINDINGS Brain: No acute territorial infarction, intracranial hemorrhage or focal mass lesion. No midline shift. Moderate-to-marked periventricular and subcortical white matter hypodensities, felt consistent with small vessel disease. Moderate global atrophy. There is cerebellar atrophy. Vascular: No hyperdense vessels. There are carotid artery calcifications. There is vertebral basilar calcification. Skull: Mastoid air cells demonstrate right-sided sclerosis. There is no skull fracture seen. Sinuses/Orbits: No acute orbital abnormality. Bilateral lens extraction. Mucosal thickening in the ethmoid and maxillary sinuses. Other: None CT CERVICAL SPINE FINDINGS Alignment: Patient is status post anterior cervical plate and screw fixation from C3 to upper aspect of C6.  There is also posterior stabilization rod and screw fixation from C3 through C7. No gross subluxation is seen. Skull base and vertebrae: Cranial vertebral junction appears intact. Again visualized are partial corpectomy changes of C4 and C5 with placement of obliquely oriented graft from C3 through C6. Interval laminectomy changes from C3 through C7. No gross fracture is seen allowing for extensive postsurgical changes. Soft tissues and spinal canal: Prevertebral soft tissue thickness appears within normal limits. Carotid artery calcifications. Protrusion of the cranial end of the graft at C3 into the anterior canal is as before. Disc levels: There is fusion of the C3 through C7 vertebral bodies.Marked hyper trophic facet changes bilaterally with multilevel foraminal stenosis present. Upper chest: Lung apices are clear. Imaged thyroid gland within normal limits. Other: None IMPRESSION: 1. No CT evidence for acute intracranial abnormality. Moderate-to-marked periventricular and subcortical white matter hypodensities fell consistent with small  vessel disease. 2. Extensive postsurgical changes of the cervical spine from C3 through C7. No gross fracture or malalignment allowing for significant surgical changes. 3. Carotid artery calcifications. Electronically Signed   By: Jasmine PangKim  Fujinaga M.D.   On: 05/06/2016 21:42    Procedures Procedures (including critical care time)  Medications Ordered in ED Medications  Tdap (BOOSTRIX) injection 0.5 mL (not administered)     Initial Impression / Assessment and Plan / ED Course  I have reviewed the triage vital signs and the nursing notes.  Pertinent  imaging results that were available during my care of the patient were reviewed by me and considered in my medical decision making (see chart for details).  Clinical Course   Fall with minor head injury. He will be sent for CT of head and cervical spine. Of note, he has DO NOT RESUSCITATE paperwork with him.Old records are reviewed, and I see no evidence of tetanus immunizations. Tdap booster is given.  CTs show no acute injury and he is discharged back to his assisted living facility.  Final Clinical Impressions(s) / ED Diagnoses   Final diagnoses:  Fall at home, initial encounter  Contusion of forehead, initial encounter    New Prescriptions New Prescriptions   No medications on file     Dione Boozeavid Analyah Mcconnon, MD 05/06/16 2215

## 2016-05-06 NOTE — ED Notes (Signed)
Pt transported home via [facility]. Pt is in stable condition upon d/c.

## 2016-05-06 NOTE — ED Triage Notes (Signed)
Pt from [facility] with unwitnessed fall out of chair. According to facility pt at baseline and is wheelchair bound with total assist. Pt not c/o of any pain.

## 2016-05-06 NOTE — ED Notes (Signed)
Patient transported to CT 

## 2016-07-27 ENCOUNTER — Encounter (HOSPITAL_COMMUNITY): Payer: Self-pay | Admitting: Emergency Medicine

## 2016-07-27 ENCOUNTER — Observation Stay (HOSPITAL_COMMUNITY)
Admission: EM | Admit: 2016-07-27 | Discharge: 2016-07-28 | Disposition: A | Discharge status: Home / Self Care | Payer: Medicare Other | Attending: Internal Medicine | Admitting: Internal Medicine

## 2016-07-27 DIAGNOSIS — E119 Type 2 diabetes mellitus without complications: Secondary | ICD-10-CM | POA: Diagnosis not present

## 2016-07-27 DIAGNOSIS — D649 Anemia, unspecified: Secondary | ICD-10-CM | POA: Diagnosis not present

## 2016-07-27 DIAGNOSIS — I2510 Atherosclerotic heart disease of native coronary artery without angina pectoris: Secondary | ICD-10-CM | POA: Insufficient documentation

## 2016-07-27 DIAGNOSIS — R233 Spontaneous ecchymoses: Secondary | ICD-10-CM | POA: Diagnosis not present

## 2016-07-27 DIAGNOSIS — E785 Hyperlipidemia, unspecified: Secondary | ICD-10-CM | POA: Diagnosis not present

## 2016-07-27 DIAGNOSIS — G9340 Encephalopathy, unspecified: Secondary | ICD-10-CM | POA: Diagnosis not present

## 2016-07-27 DIAGNOSIS — E876 Hypokalemia: Secondary | ICD-10-CM | POA: Insufficient documentation

## 2016-07-27 DIAGNOSIS — R791 Abnormal coagulation profile: Secondary | ICD-10-CM | POA: Diagnosis not present

## 2016-07-27 DIAGNOSIS — E039 Hypothyroidism, unspecified: Secondary | ICD-10-CM | POA: Insufficient documentation

## 2016-07-27 DIAGNOSIS — Z86718 Personal history of other venous thrombosis and embolism: Secondary | ICD-10-CM | POA: Diagnosis not present

## 2016-07-27 DIAGNOSIS — I5042 Chronic combined systolic (congestive) and diastolic (congestive) heart failure: Secondary | ICD-10-CM | POA: Diagnosis not present

## 2016-07-27 DIAGNOSIS — F0390 Unspecified dementia without behavioral disturbance: Secondary | ICD-10-CM | POA: Insufficient documentation

## 2016-07-27 DIAGNOSIS — Z951 Presence of aortocoronary bypass graft: Secondary | ICD-10-CM | POA: Insufficient documentation

## 2016-07-27 DIAGNOSIS — D509 Iron deficiency anemia, unspecified: Principal | ICD-10-CM | POA: Insufficient documentation

## 2016-07-27 DIAGNOSIS — Z87891 Personal history of nicotine dependence: Secondary | ICD-10-CM | POA: Insufficient documentation

## 2016-07-27 DIAGNOSIS — R627 Adult failure to thrive: Secondary | ICD-10-CM | POA: Diagnosis not present

## 2016-07-27 DIAGNOSIS — Z8249 Family history of ischemic heart disease and other diseases of the circulatory system: Secondary | ICD-10-CM | POA: Diagnosis not present

## 2016-07-27 DIAGNOSIS — I10 Essential (primary) hypertension: Secondary | ICD-10-CM | POA: Diagnosis present

## 2016-07-27 DIAGNOSIS — I82409 Acute embolism and thrombosis of unspecified deep veins of unspecified lower extremity: Secondary | ICD-10-CM | POA: Diagnosis not present

## 2016-07-27 DIAGNOSIS — Z7901 Long term (current) use of anticoagulants: Secondary | ICD-10-CM | POA: Diagnosis not present

## 2016-07-27 DIAGNOSIS — I252 Old myocardial infarction: Secondary | ICD-10-CM | POA: Insufficient documentation

## 2016-07-27 DIAGNOSIS — Z79899 Other long term (current) drug therapy: Secondary | ICD-10-CM | POA: Insufficient documentation

## 2016-07-27 DIAGNOSIS — Z981 Arthrodesis status: Secondary | ICD-10-CM | POA: Diagnosis not present

## 2016-07-27 DIAGNOSIS — I110 Hypertensive heart disease with heart failure: Secondary | ICD-10-CM | POA: Insufficient documentation

## 2016-07-27 DIAGNOSIS — K219 Gastro-esophageal reflux disease without esophagitis: Secondary | ICD-10-CM | POA: Diagnosis not present

## 2016-07-27 LAB — POC OCCULT BLOOD, ED: Fecal Occult Bld: NEGATIVE

## 2016-07-27 LAB — BASIC METABOLIC PANEL
ANION GAP: 6 (ref 5–15)
BUN: 15 mg/dL (ref 6–20)
CALCIUM: 8.3 mg/dL — AB (ref 8.9–10.3)
CO2: 26 mmol/L (ref 22–32)
Chloride: 103 mmol/L (ref 101–111)
Creatinine, Ser: 0.72 mg/dL (ref 0.61–1.24)
GFR calc Af Amer: >60 mL/min (ref >60)
GLUCOSE: 143 mg/dL — AB (ref 65–99)
Potassium: 3.9 mmol/L (ref 3.5–5.1)
Sodium: 135 mmol/L (ref 135–145)

## 2016-07-27 LAB — CBC
HCT: 23.5 % — ABNORMAL LOW (ref 39.0–52.0)
Hemoglobin: 6.8 g/dL — CL (ref 13.0–17.0)
MCH: 20.2 pg — ABNORMAL LOW (ref 26.0–34.0)
MCHC: 28.9 g/dL — ABNORMAL LOW (ref 30.0–36.0)
MCV: 69.9 fL — AB (ref 78.0–100.0)
PLATELETS: 355 10*3/uL (ref 150–400)
RBC: 3.36 MIL/uL — ABNORMAL LOW (ref 4.22–5.81)
RDW: 22.5 % — AB (ref 11.5–15.5)
WBC: 10.9 10*3/uL — ABNORMAL HIGH (ref 4.0–10.5)

## 2016-07-27 LAB — PROTIME-INR
INR: 1.15
Prothrombin Time: 14.7 seconds (ref 11.4–15.2)

## 2016-07-27 LAB — GLUCOSE, CAPILLARY: Glucose-Capillary: 139 mg/dL — ABNORMAL HIGH (ref 65–99)

## 2016-07-27 LAB — PREPARE RBC (CROSSMATCH)

## 2016-07-27 MED ORDER — ONDANSETRON HCL 4 MG/2ML IJ SOLN: 4.0000 mg | Freq: Four times a day (QID) | INTRAMUSCULAR | Status: DC | PRN

## 2016-07-27 MED ORDER — DULOXETINE HCL 20 MG PO CPEP: 20.0000 mg | ORAL_CAPSULE | Freq: Every day | ORAL | Status: DC

## 2016-07-27 MED ORDER — SODIUM CHLORIDE 0.9 % IV SOLN
Freq: Once | INTRAVENOUS | Status: AC
  Administered 2016-07-27: 18:00:00 via INTRAVENOUS

## 2016-07-27 MED ORDER — DULOXETINE HCL 30 MG PO CPEP: 30.0000 mg | ORAL_CAPSULE | Freq: Every day | ORAL | Status: DC

## 2016-07-27 MED ORDER — LISINOPRIL 5 MG PO TABS
2.5000 mg | ORAL_TABLET | Freq: Every day | ORAL | Status: DC
  Administered 2016-07-28: 2.5 mg via ORAL
  Filled 2016-07-27: qty 1

## 2016-07-27 MED ORDER — TAMSULOSIN HCL 0.4 MG PO CAPS
0.4000 mg | ORAL_CAPSULE | Freq: Every day | ORAL | Status: DC
Start: 2016-07-28 — End: 2016-07-28
  Administered 2016-07-28: 0.4 mg via ORAL
  Filled 2016-07-27: qty 1

## 2016-07-27 MED ORDER — PANTOPRAZOLE SODIUM 40 MG PO TBEC
40.0000 mg | DELAYED_RELEASE_TABLET | Freq: Every day | ORAL | Status: DC
  Administered 2016-07-28: 40 mg via ORAL
  Filled 2016-07-27: qty 1

## 2016-07-27 MED ORDER — FOLIC ACID 1 MG PO TABS
1.0000 mg | ORAL_TABLET | Freq: Every day | ORAL | Status: DC
  Administered 2016-07-28: 1 mg via ORAL
  Filled 2016-07-27: qty 1

## 2016-07-27 MED ORDER — QUETIAPINE FUMARATE 25 MG PO TABS
12.5000 mg | ORAL_TABLET | Freq: Two times a day (BID) | ORAL | Status: DC
  Administered 2016-07-27 – 2016-07-28 (×2): 12.5 mg via ORAL
  Filled 2016-07-27 (×2): qty 1

## 2016-07-27 MED ORDER — ADULT MULTIVITAMIN W/MINERALS CH
1.0000 | ORAL_TABLET | Freq: Every day | ORAL | Status: DC
  Administered 2016-07-28: 1 via ORAL
  Filled 2016-07-27 (×2): qty 1

## 2016-07-27 MED ORDER — ATORVASTATIN CALCIUM 40 MG PO TABS
40.0000 mg | ORAL_TABLET | Freq: Every day | ORAL | Status: DC
  Administered 2016-07-27: 40 mg via ORAL
  Filled 2016-07-27: qty 1

## 2016-07-27 MED ORDER — LEVOTHYROXINE SODIUM 25 MCG PO TABS
25.0000 ug | ORAL_TABLET | Freq: Every day | ORAL | Status: DC
  Administered 2016-07-28: 25 ug via ORAL
  Filled 2016-07-27: qty 1

## 2016-07-27 MED ORDER — METOPROLOL TARTRATE 25 MG PO TABS
25.0000 mg | ORAL_TABLET | Freq: Two times a day (BID) | ORAL | Status: DC
  Administered 2016-07-27 – 2016-07-28 (×2): 25 mg via ORAL
  Filled 2016-07-27 (×2): qty 1

## 2016-07-27 MED ORDER — ACETAMINOPHEN 500 MG PO TABS: 500.0000 mg | ORAL_TABLET | Freq: Four times a day (QID) | ORAL | Status: DC | PRN

## 2016-07-27 MED ORDER — DULOXETINE HCL 30 MG PO CPEP
50.0000 mg | ORAL_CAPSULE | Freq: Every day | ORAL | Status: DC
  Administered 2016-07-28: 10:00:00 50 mg via ORAL
  Filled 2016-07-27: qty 1

## 2016-07-27 MED ORDER — ONDANSETRON HCL 4 MG PO TABS: 4.0000 mg | ORAL_TABLET | Freq: Four times a day (QID) | ORAL | Status: DC | PRN

## 2016-07-27 NOTE — ED Provider Notes (Signed)
[facility]-EMERGENCY DEPT Provider Note   CSN: [id number] Arrival date & time: 07/27/16  1549     History   Chief Complaint Chief Complaint  Patient presents with  . Abnormal Lab    HPI Roy Hickman is a 76 y.o. male.  HPI 76 year old male with history of coronary disease, hypertension, history of DVT, previous on Coumadin, with history of chronic anemia due to GI bleeds who presents with anemia. Patient is demented and unable to provide further history. According to review of records, he has intermittently been admitted and transfused for recurrent GI bleeds. Facility did not recall any signs of active bleeding and denies any melena. Patient states he is otherwise without complaints. Denies any complaints.  Level 5 caveat invoked as remainder of history, ROS, and physical exam limited due to patient's dementia.   Past Medical History:  Diagnosis Date  . Chronic combined systolic and diastolic congestive heart failure (HCC)   . Coronary artery disease   . Dementia   . Diabetes mellitus   . GERD (gastroesophageal reflux disease)   . Hyperlipidemia   . Hypertension   . Left leg DVT (HCC)   . MI (mitral incompetence)   . Myocardial infarction 2007   s/p CABG     Patient Active Problem List   Diagnosis Date Noted  . Symptomatic anemia 07/27/2016  . Abdominal pain, generalized   . Encounter for palliative care   . Goals of care, counseling/discussion   . Hypothyroidism 01/03/2016  . Hypokalemia 01/03/2016  . Pressure ulcer 01/03/2016  . Anemia due to GI blood loss   . Bleeding on Coumadin   . Supratherapeutic INR   . Chronic combined systolic and diastolic congestive heart failure (HCC)   . GIB (gastrointestinal bleeding) 01/02/2016  . Failure to thrive (0-17) 01/02/2016  . Chronic anticoagulation   . Elevated troponin 06/11/2014  . Faintness   . Syncope 06/10/2014  . Dementia 06/10/2014  . Dyslipidemia 02/19/2013  . Long term current use of anticoagulant therapy  09/22/2012  . DVT of leg (deep venous thrombosis), unspecified laterality 09/22/2012  . Encephalopathy 08/20/2011  . Urinary retention 08/20/2011  . Urosepsis 08/13/2011  . Acute renal failure (HCC) 08/13/2011  . DVT of leg (deep venous thrombosis) (HCC) 08/13/2011  . Anemia 08/13/2011  . Difficulty walking 05/11/2011  . 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 Myoview low risk 1/13 05/11/2011  . DM2 (diabetes mellitus, type 2) (HCC) 05/11/2011  . HTN (hypertension) 05/11/2011  . Low back pain 05/11/2011    Past Surgical History:  Procedure Laterality Date  . BACK SURGERY    . CERVICAL SPINE SURGERY    . CORONARY ARTERY BYPASS GRAFT  2007   LIMA to his LAD, and vein to a diagonal branch and PDA. His EF at that time was 35% to 40%.   . ELBOW SURGERY     left  . LAMINECTOMY    . POSTERIOR CERVICAL FUSION/FORAMINOTOMY  07/21/2011   Procedure: POSTERIOR CERVICAL FUSION/FORAMINOTOMY LEVEL 5;  Surgeon: Karn Cassis, MD;  Location: [facility];  Service: Neurosurgery;  Laterality: N/A;  Cervical three to Thoracic one Laminectomy, posterolateral arthrodesis, with Lateral Mass screws       Home Medications    Prior to Admission medications   Medication Sig Start Date End Date Taking? Authorizing Provider  acetaminophen (TYLENOL) 500 MG tablet Take 500 mg by mouth every 6 (six) hours as needed for headache (or pain).    Yes Historical Provider, MD  Amino Acids-Protein Hydrolys (  PRO-STAT AWC) LIQD Take 60 mLs by mouth every morning.   Yes Historical Provider, MD  atorvastatin (LIPITOR) 40 MG tablet Take 40 mg by mouth at bedtime.    Yes Historical Provider, MD  Cholecalciferol (VITAMIN D-3) 1000 units CAPS Take 2,000 Units by mouth every morning.   Yes Historical Provider, MD  DULoxetine (CYMBALTA) 20 MG capsule Take 20 mg by mouth daily. Take with 30 mg to equal total dose of 50 mg.   Yes Historical Provider, MD  DULoxetine (CYMBALTA) 30 MG capsule Take 30 mg by mouth daily. Take with 20 mg to equal  total dose of 50 mg.   Yes Historical Provider, MD  folic acid ([name]) 400 MCG tablet Take 800 mcg by mouth daily.   Yes Historical Provider, MD  levothyroxine (SYNTHROID, LEVOTHROID) 25 MCG tablet Take 25 mcg by mouth daily before breakfast.   Yes Historical Provider, MD  lisinopril ([name],[name]) 2.5 MG tablet Take 1 tablet (2.5 mg total) by mouth daily. 01/05/16  Yes Leroy Sea, MD  metoprolol tartrate (LOPRESSOR) 25 MG tablet Take 1 tablet (25 mg total) by mouth 2 (two) times daily. 01/05/16  Yes Leroy Sea, MD  Multiple Vitamin (DAILY VITE) TABS Take 1 tablet by mouth daily.   Yes Historical Provider, MD  omeprazole (PRILOSEC) 40 MG capsule Take 1 capsule (40 mg total) by mouth daily. 01/05/16  Yes Leroy Sea, MD  QUEtiapine (SEROQUEL) 50 MG tablet Take 12.5 mg by mouth 2 (two) times daily.  01/24/13  Yes Historical Provider, MD  tamsulosin (FLOMAX) 0.4 MG CAPS capsule Take 1 capsule (0.4 mg total) by mouth daily. 07/02/14  Yes Jodelle Gross, NP    Family History Family History  Problem Relation Age of Onset  . Heart failure Father   . Heart failure Sister     Social History Social History  Substance Use Topics  . Smoking status: Former Smoker    Packs/day: 2.00    Years: 25.00    Types: Cigarettes    Start date: 04/01/1952    Quit date: 02/27/1987  . Smokeless tobacco: Never Used  . Alcohol use No     Comment: quit drinking in 1984     Allergies   Patient has no known allergies.   Review of Systems Review of Systems  Unable to perform ROS: Dementia     Physical Exam Updated Vital Signs BP (!) 151/72   Pulse 73   Temp 98 F (36.7 C) (Oral)   Resp 16   Ht 6' (1.829 m)   Wt 167 lb 6.4 oz (75.9 kg)   SpO2 98%   BMI 22.70 kg/m   Physical Exam  Constitutional: He is oriented to person, place, and time. He appears well-developed and well-nourished. No distress.  HENT:  Head: Normocephalic and atraumatic.  Eyes: Conjunctivae are normal.    Subconjunctival hemorrhage  Neck: Neck supple.  Cardiovascular: Normal rate, regular rhythm and normal heart sounds.  Exam reveals no friction rub.   No murmur heard. Pulmonary/Chest: Effort normal and breath sounds normal. No respiratory distress. He has no wheezes. He has no rales.  Abdominal: He exhibits no distension.  Genitourinary:  Genitourinary Comments: Soft brown stool in rectal vault.  Musculoskeletal: He exhibits no edema.  Neurological: He is alert and oriented to person, place, and time. He exhibits normal muscle tone.  Skin: Skin is warm. Capillary refill takes less than 2 seconds. There is pallor.  Psychiatric: He has a normal mood and affect.  Nursing note and vitals reviewed.    ED Treatments / Results  Labs (all labs ordered are listed, but only abnormal results are displayed) Labs Reviewed  CBC - Abnormal; Notable for the following:       Result Value   WBC 10.9 (*)    RBC 3.36 (*)    Hemoglobin 6.8 (*)    HCT 23.5 (*)    MCV 69.9 (*)    MCH 20.2 (*)    MCHC 28.9 (*)    RDW 22.5 (*)    All other components within normal limits  BASIC METABOLIC PANEL - Abnormal; Notable for the following:    Glucose, Bld 143 (*)    Calcium 8.3 (*)    All other components within normal limits  GLUCOSE, CAPILLARY - Abnormal; Notable for the following:    Glucose-Capillary 139 (*)    All other components within normal limits  PROTIME-INR  CBC  COMPREHENSIVE METABOLIC PANEL  POC OCCULT BLOOD, ED  TYPE AND SCREEN  PREPARE RBC (CROSSMATCH)    EKG  EKG Interpretation None       Radiology No results found.  Procedures .Critical Care Performed by: Shaune PollackISAACS, Sherby Moncayo Authorized by: Shaune PollackISAACS, Johnluke Haugen   Critical care provider statement:    Critical care time (minutes):  35   Critical care time was exclusive of:  Separately billable procedures and treating other patients   Critical care was necessary to treat or prevent imminent or life-threatening deterioration of  the following conditions:  Circulatory failure   Critical care was time spent personally by me on the following activities:  Blood draw for specimens, development of treatment plan with patient or surrogate, discussions with consultants, evaluation of patient's response to treatment, examination of patient, obtaining history from patient or surrogate, ordering and performing treatments and interventions, ordering and review of laboratory studies, ordering and review of radiographic studies, pulse oximetry, re-evaluation of patient's condition and review of old charts   I assumed direction of critical care for this patient from another provider in my specialty: no     (including critical care time)  Medications Ordered in ED Medications  atorvastatin (LIPITOR) tablet 40 mg (40 mg Oral Given 07/27/16 2313)  QUEtiapine (SEROQUEL) tablet 12.5 mg (12.5 mg Oral Given 07/27/16 2313)  levothyroxine (SYNTHROID, LEVOTHROID) tablet 25 mcg (not administered)  tamsulosin (FLOMAX) capsule 0.4 mg (not administered)  pantoprazole (PROTONIX) EC tablet 40 mg (not administered)  metoprolol tartrate (LOPRESSOR) tablet 25 mg (25 mg Oral Given 07/27/16 2312)  lisinopril ([name],[name]) tablet 2.5 mg (not administered)  acetaminophen (TYLENOL) tablet 500 mg (not administered)  multivitamin with minerals tablet 1 tablet (not administered)  folic acid ([name]) tablet 1 mg (not administered)  ondansetron (ZOFRAN) tablet 4 mg (not administered)    Or  ondansetron (ZOFRAN) injection 4 mg (not administered)  DULoxetine (CYMBALTA) DR capsule 50 mg (not administered)  0.9 %  sodium chloride infusion ( Intravenous Transfusing/Transfer 07/27/16 2040)     Initial Impression / Assessment and Plan / ED Course  I have reviewed the triage vital signs and the nursing notes.  Pertinent labs & imaging results that were available during my care of the patient were reviewed by me and considered in my medical decision making (see  chart for details).     76 yo M with PMHx as above here with acute on chronic anemia. CBC shows Hgb 6.8, baseline >8. BMP unremarkable. Pt denies any CP, SOB, or signs of anemia but is demented and has known CAD,  so goal is >8. Will type and cross, transfuse in ED for acute on chronic anemia, and admit to monitored bed.  Final Clinical Impressions(s) / ED Diagnoses   Final diagnoses:  Anemia, unspecified type    New Prescriptions Current Discharge Medication List       Shaune Pollack, MD 07/28/16 [phone]

## 2016-07-27 NOTE — Clinical Social Work Note (Addendum)
Clinical Social Work Assessment  Patient Details  Name: Roy Hickman H Wagster MRN: [id number] Date of Birth: December 23, 1940  Date of referral:  07/27/16               Reason for consult:  Facility Placement                Permission sought to share information with:  Facility Industrial/product designerContact Representative Permission granted to share information::  Yes, Verbal Permission Granted  Name::        Agency::     Relationship::     Contact Information:     Housing/Transportation Living arrangements for the past 2 months:  Skilled Nursing Facility Source of Information:  Adult Children Patient Interpreter Needed:  None Criminal Activity/Legal Involvement Pertinent to Current Situation/Hospitalization:  No - Comment as needed Significant Relationships:  Adult Children Roy Hickman(Roy Hickman at ph: [phone]) Lives with:  Facility Resident Do you feel safe going back to the place where you live?  Yes Need for family participation in patient care:  Yes (Comment)  Care giving concerns:  Family member was unsure of level of care needed.   Social Worker assessment / plan:  CSW and family member discussed discharge plans.  [name] reported that during the pt's last hospitalization in July of 2016 she had been told pt was "ready for Hospice" and had instead [facility]  [name] wanted to know if pt would be considered for Hospice during current admission.  CSW informed pt's daughter that this would possibly be discussed once the pt was admitted to inpatient.   Employment status:  Retired Database administratornsurance information:  Managed Medicare PT Recommendations:  Not assessed at this time Information / Referral to community resources:  Skilled Nursing Facility  Patient/Family's Response to care:  [name] was Adult nurseappreciative and thanked the CSW.  Patient/Family's Understanding of and Emotional Response to Diagnosis, Current Treatment, and Prognosis: Unknown at this  time.  Emotional Assessment Appearance:  Appears stated age Attitude/Demeanor/Rapport:  Unable to Assess Affect (typically observed):  Unable to Assess Orientation:    Alcohol / Substance use:    Psych involvement (Current and /or in the community):  No (Comment)  Discharge Needs  Concerns to be addressed:  Discharge Planning Concerns Readmission within the last 30 days:  No Current discharge risk:  None Barriers to Discharge:  No Barriers Identified   Mercy RidingJonathan F Ardie Dragoo, LCSWA 07/27/2016, 8:33 PM

## 2016-07-27 NOTE — ED Notes (Signed)
RN remained with patient during the first 15 minutes of blood transfusion. Patient not displaying any signs/symptoms of transfusion reaction. Vital signs rechecked at the 15 minute mark.

## 2016-07-27 NOTE — Progress Notes (Signed)
CSW made attempt to complete assessment with the pt but pt presented as not oriented.  CSW made attempt to contact pt's daughter Servando SnareLisa Sizemore at ph: [phone] and ph: [phone] but pt's daughter did not answer.  CSW left a voicemail at the first number.    Dorothe PeaJonathan F. Yasin Ducat, Theresia MajorsLCSWA, LCAS Clinical Social Worker Ph: [phone]

## 2016-07-27 NOTE — ED Triage Notes (Signed)
Per EMS, patient's hemoglobin was 6.8 at facility. Facility states they have no noticed any new bleeding. Hx of anemia and blood transfusions. Patient is at neuro baseline. [facility].

## 2016-07-27 NOTE — ED Notes (Signed)
Per niece, Servando SnareLisa Sizemore, it is ok for patient to receive blood products

## 2016-07-27 NOTE — ED Notes (Signed)
RN notified of abnormal lab 

## 2016-07-27 NOTE — H&P (Signed)
History and Physical    Roy Hickman ZOX:[id number] DOB: April 28, 1941 DOA: 07/27/2016  PCP: Remus Loffler, PA-C   Patient coming from: Home.  Chief Complaint: Anemia.  HPI: Roy Hickman is a 76 y.o. male with medical history significant of chronic combined systolic and diastolic CHF, CAD, CABG, MI, left lower extremity DVT, dementia, type 2 diabetes, GERD, hyperlipidemia, hypertension, chronic anemia who was sent from the nursing home facility where he resides for transfusion after the patient has been feeling weak and his hemoglobin level was 6.8 g/dL earlier. He is unable to provide further history, but denies abdominal pain when asked and palpated. No chest pain, no dyspnea, no lower extremity edema. The patient has multiple ecchymosis area on right forearm and hand.  ED Course: Workup in the ER showed normal PT and INR, WBC 10.9 hemoglobin level VI.8 g/dL and platelets 811. His sodium 135, potassium 3.9, chloride 103, CO2 26 mmol/L. BUN was 15, creatinine 0.72 and glucose 143 mg/dL  Review of Systems: As per HPI otherwise 10 point review of systems negative.    Past Medical History:  Diagnosis Date  . Chronic combined systolic and diastolic congestive heart failure (HCC)   . Coronary artery disease   . Dementia   . Diabetes mellitus   . GERD (gastroesophageal reflux disease)   . Hyperlipidemia   . Hypertension   . Left leg DVT (HCC)   . MI (mitral incompetence)   . Myocardial infarction 2007   s/p CABG     Past Surgical History:  Procedure Laterality Date  . BACK SURGERY    . CERVICAL SPINE SURGERY    . CORONARY ARTERY BYPASS GRAFT  2007   LIMA to his LAD, and vein to a diagonal branch and PDA. His EF at that time was 35% to 40%.   . ELBOW SURGERY     left  . LAMINECTOMY    . POSTERIOR CERVICAL FUSION/FORAMINOTOMY  07/21/2011   Procedure: POSTERIOR CERVICAL FUSION/FORAMINOTOMY LEVEL 5;  Surgeon: Karn Cassis, MD;  Location: [facility];  Service: Neurosurgery;   Laterality: N/A;  Cervical three to Thoracic one Laminectomy, posterolateral arthrodesis, with Lateral Mass screws     reports that he quit smoking about 29 years ago. His smoking use included Cigarettes. He started smoking about 64 years ago. He has a 50.00 pack-year smoking history. He has never used smokeless tobacco. He reports that he does not drink alcohol or use drugs.  No Known Allergies  Family History  Problem Relation Age of Onset  . Heart failure Father   . Heart failure Sister     Prior to Admission medications   Medication Sig Start Date End Date Taking? Authorizing Provider  acetaminophen (TYLENOL) 500 MG tablet Take 500 mg by mouth every 6 (six) hours as needed for headache (or pain).    Yes Historical Provider, MD  Amino Acids-Protein Hydrolys (PRO-STAT AWC) LIQD Take 60 mLs by mouth every morning.   Yes Historical Provider, MD  atorvastatin (LIPITOR) 40 MG tablet Take 40 mg by mouth at bedtime.    Yes Historical Provider, MD  Cholecalciferol (VITAMIN D-3) 1000 units CAPS Take 2,000 Units by mouth every morning.   Yes Historical Provider, MD  DULoxetine (CYMBALTA) 20 MG capsule Take 20 mg by mouth daily. Take with 30 mg to equal total dose of 50 mg.   Yes Historical Provider, MD  DULoxetine (CYMBALTA) 30 MG capsule Take 30 mg by mouth daily. Take with 20 mg to equal  total dose of 50 mg.   Yes Historical Provider, MD  folic acid ([name]) 400 MCG tablet Take 800 mcg by mouth daily.   Yes Historical Provider, MD  levothyroxine (SYNTHROID, LEVOTHROID) 25 MCG tablet Take 25 mcg by mouth daily before breakfast.   Yes Historical Provider, MD  lisinopril ([name],[name]) 2.5 MG tablet Take 1 tablet (2.5 mg total) by mouth daily. 01/05/16  Yes Leroy Sea, MD  metoprolol tartrate (LOPRESSOR) 25 MG tablet Take 1 tablet (25 mg total) by mouth 2 (two) times daily. 01/05/16  Yes Leroy Sea, MD  Multiple Vitamin (DAILY VITE) TABS Take 1 tablet by mouth daily.   Yes Historical  Provider, MD  omeprazole (PRILOSEC) 40 MG capsule Take 1 capsule (40 mg total) by mouth daily. 01/05/16  Yes Leroy Sea, MD  QUEtiapine (SEROQUEL) 50 MG tablet Take 12.5 mg by mouth 2 (two) times daily.  01/24/13  Yes Historical Provider, MD  tamsulosin (FLOMAX) 0.4 MG CAPS capsule Take 1 capsule (0.4 mg total) by mouth daily. 07/02/14  Yes Jodelle Gross, NP    Physical Exam:  Constitutional: NAD, calm, comfortable Vitals:   07/27/16 1630 07/27/16 1700 07/27/16 1730 07/27/16 1800  BP: 122/59 140/85 150/82 143/96  Pulse: 70 76 78 80  Resp: 17 18 23 22   Temp:      TempSrc:      SpO2: 93% 94% 98% 98%  Weight:      Height:       Eyes: PERRL, lids and conjunctivae are pale. ENMT: Mucous membranes are moist. Posterior pharynx clear of any exudate or lesions.Absent dentition.  Neck: normal, supple, no masses, no thyromegaly Respiratory: clear to auscultation bilaterally, no wheezing, no crackles. Normal respiratory effort. No accessory muscle use.  Cardiovascular: Regular rate and rhythm, no murmurs / rubs / gallops. No extremity edema. 2+ pedal pulses. No carotid bruits.  Abdomen: Soft, no tenderness, no masses palpated. No hepatosplenomegaly. Bowel sounds positive.  Musculoskeletal: no clubbing / cyanosis. No joint deformity upper and lower extremities. Good ROM, no contractures. Normal muscle tone.  Skin: Multiple ecchymosis and a small abrasions areas on right forearm. Neurologic: CN 2-12 grossly intact. Sensation intact, DTR normal. Strength 5/5 in all 4.  Psychiatric: Alert and oriented x 2.     Labs on Admission: I have personally reviewed following labs and imaging studies  CBC:  Recent Labs Lab 07/27/16 1652  WBC 10.9*  HGB 6.8*  HCT 23.5*  MCV 69.9*  PLT 355   Basic Metabolic Panel:  Recent Labs Lab 07/27/16 1652  NA 135  K 3.9  CL 103  CO2 26  GLUCOSE 143*  BUN 15  CREATININE 0.72  CALCIUM 8.3*   GFR: Estimated Creatinine Clearance: 84.9 mL/min  (by C-G formula based on SCr of 0.72 mg/dL). Liver Function Tests: No results for input(s): AST, ALT, ALKPHOS, BILITOT, PROT, ALBUMIN in the last 168 hours. No results for input(s): LIPASE, AMYLASE in the last 168 hours. No results for input(s): AMMONIA in the last 168 hours. Coagulation Profile:  Recent Labs Lab 07/27/16 1652  INR 1.15   Cardiac Enzymes: No results for input(s): CKTOTAL, CKMB, CKMBINDEX, TROPONINI in the last 168 hours. BNP (last 3 results) No results for input(s): PROBNP in the last 8760 hours. HbA1C: No results for input(s): HGBA1C in the last 72 hours. CBG: No results for input(s): GLUCAP in the last 168 hours. Lipid Profile: No results for input(s): CHOL, HDL, LDLCALC, TRIG, CHOLHDL, LDLDIRECT in the last 72 hours.  Thyroid Function Tests: No results for input(s): TSH, T4TOTAL, FREET4, T3FREE, THYROIDAB in the last 72 hours. Anemia Panel: No results for input(s): VITAMINB12, FOLATE, FERRITIN, TIBC, IRON, RETICCTPCT in the last 72 hours. Urine analysis:    Component Value Date/Time   COLORURINE AMBER (A) 01/03/2016 0036   APPEARANCEUR CLEAR 01/03/2016 0036   LABSPEC 1.024 01/03/2016 0036   PHURINE 6.0 01/03/2016 0036   GLUCOSEU NEGATIVE 01/03/2016 0036   HGBUR NEGATIVE 01/03/2016 0036   BILIRUBINUR MODERATE (A) 01/03/2016 0036   KETONESUR 15 (A) 01/03/2016 0036   PROTEINUR NEGATIVE 01/03/2016 0036   UROBILINOGEN 0.2 06/10/2014 1227   NITRITE NEGATIVE 01/03/2016 0036   LEUKOCYTESUR TRACE (A) 01/03/2016 0036    Assessment/Plan Principal Problem:   Symptomatic anemia Admit to MedSurg/observation. Continue transfusion of 2 units of packed RBCs. Follow-up hematocrit and hemoglobin in a.m.  Active Problems:   CAD, CABG X 3 11/07. Myoview low risk 1/13 Not on aspirin. GI bleed concern? Continue atorvastatin, metoprolol and lisinopril.    DM2 (diabetes mellitus, type 2) (HCC) Carbohydrate modified diet. CBG monitoring before meals.    HTN  (hypertension) Continue metoprolol 25 mg by mouth twice a day. Continue lisinopril 2.5 mg by mouth daily. Monitor blood pressure.    Dyslipidemia Continue atorvastatin 40 mg by mouth daily. Monitor LFTs periodically.    Dementia Continue Loxitane 50 mg by mouth daily. Continue Seroquel 12.5 mg by mouth twice a day. Supportive care    Hypothyroidism Continue levothyroxine 25 g by mouth daily. Monitor TSH periodically.    Chronic combined systolic and diastolic congestive heart failure (HCC) Compensated. Continue metoprolol 25 mg by mouth twice a day. Continue lisinopril 2.5 mg by mouth daily.     DVT prophylaxis: SCDs Code Status: DNR/DNI. Family Communication:  Disposition Plan: Admit to transfuse 2 units of packed RBCs. Consults called:  Admission status: Observation/MedSurg.   Bobette Moavid Manuel Naydeline Morace MD [name] Pager [phone].  If 7PM-7AM, please contact night-coverage [email or web address] Password [email or web address]  07/27/2016, 6:17 PM

## 2016-07-27 NOTE — ED Notes (Signed)
Bed: WA03 Expected date:  Expected time:  Means of arrival:  Comments: EMS-low HgB 

## 2016-07-27 NOTE — ED Notes (Signed)
Occult card placed at bedside for EDP.

## 2016-07-28 DIAGNOSIS — E039 Hypothyroidism, unspecified: Secondary | ICD-10-CM

## 2016-07-28 DIAGNOSIS — D649 Anemia, unspecified: Secondary | ICD-10-CM | POA: Diagnosis not present

## 2016-07-28 DIAGNOSIS — I5042 Chronic combined systolic (congestive) and diastolic (congestive) heart failure: Secondary | ICD-10-CM | POA: Diagnosis not present

## 2016-07-28 DIAGNOSIS — F0390 Unspecified dementia without behavioral disturbance: Secondary | ICD-10-CM | POA: Diagnosis not present

## 2016-07-28 DIAGNOSIS — D509 Iron deficiency anemia, unspecified: Secondary | ICD-10-CM | POA: Diagnosis not present

## 2016-07-28 LAB — TYPE AND SCREEN
BLOOD PRODUCT EXPIRATION DATE: 201802072359
Blood Product Expiration Date: 201802072359
ISSUE DATE / TIME: 201801231814
ISSUE DATE / TIME: 201801232321
UNIT TYPE AND RH: 5100
Unit Type and Rh: 5100

## 2016-07-28 LAB — COMPREHENSIVE METABOLIC PANEL
ALT: 9 U/L — AB (ref 17–63)
AST: 10 U/L — AB (ref 15–41)
Albumin: 2.5 g/dL — ABNORMAL LOW (ref 3.5–5.0)
Alkaline Phosphatase: 63 U/L (ref 38–126)
Anion gap: 5 (ref 5–15)
BUN: 12 mg/dL (ref 6–20)
CHLORIDE: 105 mmol/L (ref 101–111)
CO2: 27 mmol/L (ref 22–32)
CREATININE: 0.88 mg/dL (ref 0.61–1.24)
Calcium: 8.7 mg/dL — ABNORMAL LOW (ref 8.9–10.3)
GFR calc non Af Amer: >60 mL/min (ref >60)
Glucose, Bld: 153 mg/dL — ABNORMAL HIGH (ref 65–99)
Potassium: 4.2 mmol/L (ref 3.5–5.1)
SODIUM: 137 mmol/L (ref 135–145)
Total Bilirubin: 0.7 mg/dL (ref 0.3–1.2)
Total Protein: 6.1 g/dL — ABNORMAL LOW (ref 6.5–8.1)

## 2016-07-28 LAB — CBC
HCT: 30.2 % — ABNORMAL LOW (ref 39.0–52.0)
Hemoglobin: 9.4 g/dL — ABNORMAL LOW (ref 13.0–17.0)
MCH: 22.9 pg — AB (ref 26.0–34.0)
MCHC: 31.1 g/dL (ref 30.0–36.0)
MCV: 73.7 fL — ABNORMAL LOW (ref 78.0–100.0)
PLATELETS: 328 10*3/uL (ref 150–400)
RBC: 4.1 MIL/uL — AB (ref 4.22–5.81)
RDW: 21.4 % — ABNORMAL HIGH (ref 11.5–15.5)
WBC: 9.8 10*3/uL (ref 4.0–10.5)

## 2016-07-28 LAB — GLUCOSE, CAPILLARY
GLUCOSE-CAPILLARY: 147 mg/dL — AB (ref 65–99)
Glucose-Capillary: 151 mg/dL — ABNORMAL HIGH (ref 65–99)

## 2016-07-28 NOTE — Progress Notes (Signed)
[facility], report was called to "Dorathy DaftKayla" at [phone] where all questions were answered in regards to Mr. Roy Hickman. [facility] will transport patient to facility. Koby Hartfield R McClean

## 2016-07-28 NOTE — Discharge Summary (Signed)
Physician Discharge Summary  Roy Hickman MVH:[id number] DOB: February 24, 1941  PCP: Remus Loffler, PA-C  Admit date: 07/27/2016 Discharge date: 07/28/2016  Recommendations for Outpatient Follow-up:  1. MD at SNF in 3 days with repeat labs (CBC, BMP & anemia panel). 2. [name], PA-C/PCP  Home Health: None Equipment/Devices: None    Discharge Condition: Improved and stable.  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 Myoview low risk 1/13   DM2 (diabetes mellitus, type 2) (HCC)   HTN (hypertension)   Dyslipidemia   Dementia   Hypothyroidism   Chronic combined systolic and diastolic congestive heart failure (HCC)   Brief/Interim Summary: 76 year old male, SNF resident, as per family-nonambulatory, advanced dementia, PMH of chronic combined systolic and diastolic CHF, CAD status post CABG, MI, left lower extremity DVT, type II DM, GERD, HLD, HTN, chronic anemia who was sent from his SNF with complaints of feeling weak, hemoglobin of 6.8 and for blood transfusion. Patient was unable to provide any history due to his advanced dementia. As per discussion with his family today, he has advanced dementia and only at times recognizes his family even. No bleeding issues reported. In the ED, normal PT & INR, WBC 10.9, hemoglobin 6.8, platelets 355. Admitted for further management.  Assessment and plan:  1. Symptomatic anemia/microcytic anemia: Baseline hemoglobin may be in the 7-8 range. This may be due to anemia of chronic disease but iron deficiency anemia needs to be ruled out. Unfortunately no anemia panel was sent off prior to transfusion. He was transfused 2 units of PRBC and hemoglobin improved from 6.8 > 9.4 g per DL. Recommend follow-up CBC closely at SNF and consider iron studies and replace iron as needed. May also consider further workup  depending on how aggressive family wishes to be in his care, given history of advanced dementia and nonambulatory status. FOBT negative. 2. CAD status post CABG: Low risk Myoview 1/13. Not on aspirin prior to admission. Continue atorvastatin, metoprolol and lisinopril. 3. Diet-controlled DM 2: Not on oral hypoglycemics PTA. CBGs reasonably controlled in the hospital. 4. Essential hypertension: Reasonable control. Continue metoprolol and lisinopril. 5. Hyperlipidemia: Continue atorvastatin. 6. Advanced dementia: Mental status probably at baseline. Was somewhat agitated this morning and tried to hit out at staff when labs were being drawn. However since then patient has been calm and cooperative. Continue Cymbalta, Seroquel. 7. Hypothyroid: Continue Synthroid. 8. Chronic combined systolic and diastolic CHF: Clinically compensated. Not on diuretics prior to admission. Continue metoprolol and lisinopril.   Discharge Instructions  Discharge Instructions    Call MD for:  difficulty breathing, headache or visual disturbances    Complete by:  As directed    Call MD for:  extreme fatigue    Complete by:  As directed    Call MD for:  persistant dizziness or light-headedness    Complete by:  As directed    Call MD for:  severe uncontrolled pain    Complete by:  As directed    Diet - low sodium heart healthy    Complete by:  As directed    Increase activity slowly    Complete by:  As directed        Medication List    TAKE these medications   acetaminophen 500 MG tablet Commonly known as:  TYLENOL Take 500 mg by mouth every 6 (six) hours as  needed for headache (or pain).   atorvastatin 40 MG tablet Commonly known as:  LIPITOR Take 40 mg by mouth at bedtime.   DAILY VITE Tabs Take 1 tablet by mouth daily.   DULoxetine 20 MG capsule Commonly known as:  CYMBALTA Take 20 mg by mouth daily. Take with 30 mg to equal total dose of 50 mg.   DULoxetine 30 MG capsule Commonly known as:   CYMBALTA Take 30 mg by mouth daily. Take with 20 mg to equal total dose of 50 mg.   folic acid 400 MCG tablet Commonly known as:  [name] Take 800 mcg by mouth daily.   levothyroxine 25 MCG tablet Commonly known as:  SYNTHROID, LEVOTHROID Take 25 mcg by mouth daily before breakfast.   lisinopril 2.5 MG tablet Commonly known as:  [name],[name] Take 1 tablet (2.5 mg total) by mouth daily.   metoprolol tartrate 25 MG tablet Commonly known as:  LOPRESSOR Take 1 tablet (25 mg total) by mouth 2 (two) times daily.   omeprazole 40 MG capsule Commonly known as:  PRILOSEC Take 1 capsule (40 mg total) by mouth daily.   PRO-STAT AWC Liqd Take 60 mLs by mouth every morning.   QUEtiapine 50 MG tablet Commonly known as:  SEROQUEL Take 12.5 mg by mouth 2 (two) times daily.   tamsulosin 0.4 MG Caps capsule Commonly known as:  FLOMAX Take 1 capsule (0.4 mg total) by mouth daily.   Vitamin D-3 1000 units Caps Take 2,000 Units by mouth every morning.      Follow-up Information    MD at SNF. Schedule an appointment as soon as possible for a visit in 3 day(s).   Why:  To be seen with repeat labs (CBC, BMP & anemia panel).       Remus Loffler, PA-C. Schedule an appointment as soon as possible for a visit.   Specialties:  Physician Assistant, Family Medicine Contact information: [address] [place] [address] [phone]          No Known Allergies  Consultations:  None   Procedures/Studies: No results found.    Subjective: Overnight events noted. Patient pleasant and cooperative this morning without agitation. Denies complaints. No chest pain, dyspnea or bleeding reported. As per RN, no acute issues noted.  Discharge Exam:  Vitals:   07/27/16 2345 07/28/16 0216 07/28/16 0542 07/28/16 1040  BP: (!) 151/72 129/88 126/64 (!) 161/76  Pulse: 73 71 63 68  Resp: 16 16 16    Temp: 98 F (36.7 C) 97.9 F (36.6 C) 98 F (36.7 C)   TempSrc: Oral Axillary Oral    SpO2: 98%  95% 98%  Weight:      Height:        General: Pt lying comfortably in bed & appears in no obvious distress. Cardiovascular: S1 & S2 heard, RRR, S1/S2 +. No murmurs, rubs, gallops or clicks. No JVD or pedal edema. Respiratory: Clear to auscultation without wheezing, rhonchi or crackles. No increased work of breathing. Abdominal:  Non distended, non tender & soft. No organomegaly or masses appreciated. Normal bowel sounds heard. CNS: Alert and oriented only to self. No focal deficits. Extremities: no edema, no cyanosis Skin: This was examined with his RN at bedside. He has a couple of small areas of fading bruises over his upper extremities but nothing large or acute. He has faint pale pink discoloration of the sacral area and mid back, likely related to moisture versus?? Rash but no open wounds. He also has  one such lesion on the right mid lateral thigh. Etiology of this is not certain but these don't appear like ecchymosis.    The results of significant diagnostics from this hospitalization (including imaging, microbiology, ancillary and laboratory) are listed below for reference.     Microbiology: No results found for this or any previous visit (from the past 240 hour(s)).   Labs: BNP (last 3 results)  Recent Labs  01/03/16 0821  BNP 236.8*   Basic Metabolic Panel:  Recent Labs Lab 07/27/16 1652 07/28/16 0836  NA 135 137  K 3.9 4.2  CL 103 105  CO2 26 27  GLUCOSE 143* 153*  BUN 15 12  CREATININE 0.72 0.88  CALCIUM 8.3* 8.7*   Liver Function Tests:  Recent Labs Lab 07/28/16 0836  AST 10*  ALT 9*  ALKPHOS 63  BILITOT 0.7  PROT 6.1*  ALBUMIN 2.5*   CBC:  Recent Labs Lab 07/27/16 1652 07/28/16 0836  WBC 10.9* 9.8  HGB 6.8* 9.4*  HCT 23.5* 30.2*  MCV 69.9* 73.7*  PLT 355 328   CBG:  Recent Labs Lab 07/27/16 2154 07/28/16 0807 07/28/16 1213  GLUCAP 139* 151* 147*    Discussed in detail with patient's niece who is his next of kin.  Updated care and answered questions.  Time coordinating discharge: Over 30 minutes  SIGNED:  Marcellus ScottHONGALGI,ANAND, MD, FACP, FHM. [name] Pager [phone]  If 7PM-7AM, please contact night-coverage [email or web address] Password [email or web address] 07/28/2016, 2:30 PM

## 2016-07-28 NOTE — Progress Notes (Addendum)
Phlebotomy came to draw blood and Patient is refusing blood to see result of post PRBC transfusion.  Lab tech will send another person around 0630.  Patient is hitting nurse and yelling and not putting arm out.  Patient also refused nasal swab for MRSA as he's from a nursing home.

## 2016-07-28 NOTE — Discharge Instructions (Signed)
Anemia, Nonspecific Anemia is a condition in which the concentration of red blood cells or hemoglobin in the blood is below normal. Hemoglobin is a substance in red blood cells that carries oxygen to the tissues of the body. Anemia results in not enough oxygen reaching these tissues. What are the causes? Common causes of anemia include:  Excessive bleeding. Bleeding may be internal or external. This includes excessive bleeding from periods (in women) or from the intestine.  Poor nutrition.  Chronic kidney, thyroid, and liver disease.  Bone marrow disorders that decrease red blood cell production.  Cancer and treatments for cancer.  HIV, AIDS, and their treatments.  Spleen problems that increase red blood cell destruction.  Blood disorders.  Excess destruction of red blood cells due to infection, medicines, and autoimmune disorders. What are the signs or symptoms?  Minor weakness.  Dizziness.  Headache.  Palpitations.  Shortness of breath, especially with exercise.  Paleness.  Cold sensitivity.  Indigestion.  Nausea.  Difficulty sleeping.  Difficulty concentrating. Symptoms may occur suddenly or they may develop slowly. How is this diagnosed? Additional blood tests are often needed. These help your health care provider determine the best treatment. Your health care provider will check your stool for blood and look for other causes of blood loss. How is this treated? Treatment varies depending on the cause of the anemia. Treatment can include:  Supplements of iron, vitamin B12, or folic acid.  Hormone medicines.  A blood transfusion. This may be needed if blood loss is severe.  Hospitalization. This may be needed if there is significant continual blood loss.  Dietary changes.  Spleen removal. Follow these instructions at home: Keep all follow-up appointments. It often takes many weeks to correct anemia, and having your health care provider check on your  condition and your response to treatment is very important. Get help right away if:  You develop extreme weakness, shortness of breath, or chest pain.  You become dizzy or have trouble concentrating.  You develop heavy vaginal bleeding.  You develop a rash.  You have bloody or black, tarry stools.  You faint.  You vomit up blood.  You vomit repeatedly.  You have abdominal pain.  You have a fever or persistent symptoms for more than 2-3 days.  You have a fever and your symptoms suddenly get worse.  You are dehydrated. This information is not intended to replace advice given to you by your health care provider. Make sure you discuss any questions you have with your health care provider. Document Released: 07/29/2004 Document Revised: 12/03/2015 Document Reviewed: 12/15/2012 Elsevier Interactive Patient Education  2017 Elsevier Inc.  

## 2016-07-28 NOTE — NC FL2 (Signed)
[place] MEDICAID FL2 LEVEL OF CARE SCREENING TOOL     IDENTIFICATION  Patient Name: Roy Hickman Birthdate: [birth year] Sex: male Admission Date (Current Location): 07/27/2016  [facility] and [place] Number:  Producer, television/film/video and Address:  [facility],  [address] [place] [address]      Provider Number: [id number]  Attending Physician Name and Address:  Elease Etienne, MD  Relative Name and Phone Number:       Current Level of Care: Hospital Recommended Level of Care: Skilled Nursing Facility Prior Approval Number:    Date Approved/Denied:   PASRR Number:  [id number])  Discharge Plan: SNF    Current Diagnoses: Patient Active Problem List   Diagnosis Date Noted  . Symptomatic anemia 07/27/2016  . Abdominal pain, generalized   . Encounter for palliative care   . Goals of care, counseling/discussion   . Hypothyroidism 01/03/2016  . Hypokalemia 01/03/2016  . Pressure ulcer 01/03/2016  . Anemia due to GI blood loss   . Bleeding on Coumadin   . Supratherapeutic INR   . Chronic combined systolic and diastolic congestive heart failure (HCC)   . GIB (gastrointestinal bleeding) 01/02/2016  . Failure to thrive (0-17) 01/02/2016  . Chronic anticoagulation   . Elevated troponin 06/11/2014  . Faintness   . Syncope 06/10/2014  . Dementia 06/10/2014  . Dyslipidemia 02/19/2013  . Long term current use of anticoagulant therapy 09/22/2012  . DVT of leg (deep venous thrombosis), unspecified laterality 09/22/2012  . Encephalopathy 08/20/2011  . Urinary retention 08/20/2011  . Urosepsis 08/13/2011  . Acute renal failure (HCC) 08/13/2011  . DVT of leg (deep venous thrombosis) (HCC) 08/13/2011  . Anemia 08/13/2011  . Difficulty walking 05/11/2011  . 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 Myoview low risk 1/13 05/11/2011  . DM2 (diabetes mellitus, type 2) (HCC) 05/11/2011  . HTN (hypertension) 05/11/2011  . Low back pain 05/11/2011    Orientation RESPIRATION BLADDER  Height & Weight     Self  Normal Incontinent Weight: 167 lb 6.4 oz (75.9 kg) Height:  6' (182.9 cm)  BEHAVIORAL SYMPTOMS/MOOD NEUROLOGICAL BOWEL NUTRITION STATUS   (none)  (none) Continent Diet (Heart Healthy)  AMBULATORY STATUS COMMUNICATION OF NEEDS Skin   Limited Assist Verbally Other (Comment) (Pressure Injury- dressing changes PRN)                       Personal Care Assistance Level of Assistance  Bathing, Feeding, Dressing Bathing Assistance: Maximum assistance Feeding assistance: Limited assistance Dressing Assistance: Maximum assistance     Functional Limitations Info  Speech, Hearing, Sight Sight Info: Adequate Hearing Info: Adequate Speech Info: Adequate    SPECIAL CARE FACTORS FREQUENCY                       Contractures      Additional Factors Info  Code Status, Allergies Code Status Info: DNR  Allergies Info: N/A           Current Medications (07/28/2016):  This is the current hospital active medication list Current Facility-Administered Medications  Medication Dose Route Frequency Provider Last Rate Last Dose  . acetaminophen (TYLENOL) tablet 500 mg  500 mg Oral Q6H PRN Bobette Mo, MD      . atorvastatin (LIPITOR) tablet 40 mg  40 mg Oral QHS Bobette Mo, MD   40 mg at 07/27/16 2313  . DULoxetine (CYMBALTA) DR capsule 50 mg  50 mg Oral Daily Onalee Hua  Rutha BouchardManuel Ortiz, MD   50 mg at 07/28/16 1028  . folic acid ([name]) tablet 1 mg  1 mg Oral Daily Bobette Moavid Manuel Ortiz, MD   1 mg at 07/28/16 1033  . levothyroxine (SYNTHROID, LEVOTHROID) tablet 25 mcg  25 mcg Oral QAC breakfast Bobette Moavid Manuel Ortiz, MD   25 mcg at 07/28/16 0750  . lisinopril ([name],[name]) tablet 2.5 mg  2.5 mg Oral Daily Bobette Moavid Manuel Ortiz, MD   2.5 mg at 07/28/16 1032  . metoprolol tartrate (LOPRESSOR) tablet 25 mg  25 mg Oral BID Bobette Moavid Manuel Ortiz, MD   25 mg at 07/28/16 1032  . multivitamin with minerals tablet 1 tablet  1 tablet Oral Daily Bobette Moavid Manuel Ortiz, MD    1 tablet at 07/28/16 1028  . ondansetron (ZOFRAN) tablet 4 mg  4 mg Oral Q6H PRN Bobette Moavid Manuel Ortiz, MD       Or  . ondansetron [facility](ZOFRAN) injection 4 mg  4 mg Intravenous Q6H PRN Bobette Moavid Manuel Ortiz, MD      . pantoprazole (PROTONIX) EC tablet 40 mg  40 mg Oral Daily Bobette Moavid Manuel Ortiz, MD   40 mg at 07/28/16 1028  . QUEtiapine (SEROQUEL) tablet 12.5 mg  12.5 mg Oral BID Bobette Moavid Manuel Ortiz, MD   12.5 mg at 07/28/16 1032  . tamsulosin (FLOMAX) capsule 0.4 mg  0.4 mg Oral Daily Bobette Moavid Manuel Ortiz, MD   0.4 mg at 07/28/16 1032     Discharge Medications: Please see discharge summary for a list of discharge medications.  Relevant Imaging Results:  Relevant Lab Results:   Additional Information SSN [id number]  Vaughan Brownerixon, Samaad Hashem A

## 2016-07-28 NOTE — Clinical Social Work Note (Signed)
Medical Social Worker facilitated patient discharge including contacting patient family (left voice message with dtr, Misty StanleyLisa) and facility to confirm patient discharge plans.  Clinical information faxed to facility and family agreeable with plan.  MSW [facility]  RN to call report prior to discharge.  Medical Social Worker will sign off for now as social work intervention is no longer needed. Please consult us again if new need arises.  Derenda FennelBashira Hadas Jessop, MSW [phone] 07/28/2016 3:07 PM

## 2016-07-28 NOTE — Progress Notes (Signed)
Patient has petechia on buttocks, and a 1 cm red area on lower right buttocks.  And multiple skin petechia on arms and right lateral hip.  Also she has bilateral feet edema with no ankle edema.

## 2016-09-26 IMAGING — CT CT HEAD W/O CM
1 series · 16 of 30 positions shown, 20 images · non-contrast
Comparison: Correlation with brain MRI from 08/19/2011

CLINICAL DATA: 73-year-old with syncope, on Coumadin

EXAM:
CT HEAD WITHOUT CONTRAST
TECHNIQUE: Contiguous axial images were obtained from the base of the skull
through the vertex without intravenous contrast.

[Series 3: headseq 4.8 h37s · axial · 0.43mm/px · z∈[+112,+267]mm · 16 of 36 slices shown, 20 images]
[im 2/36  brain]
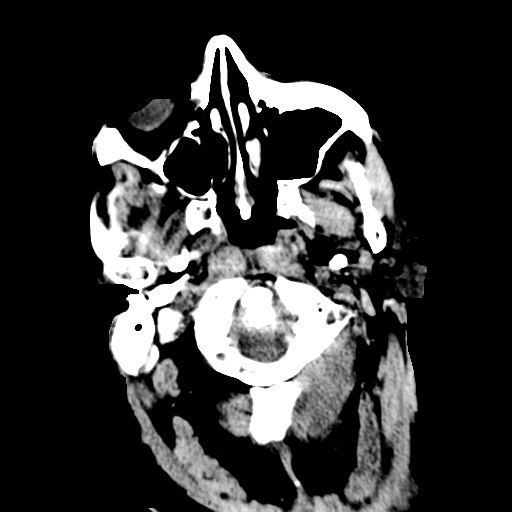
[im 2/36  bone]
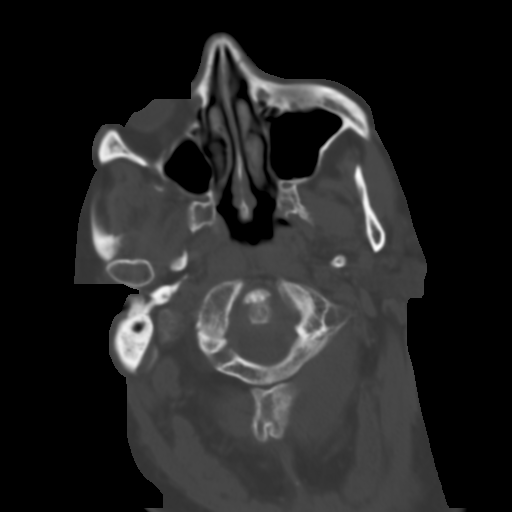
[im 4/36  brain]
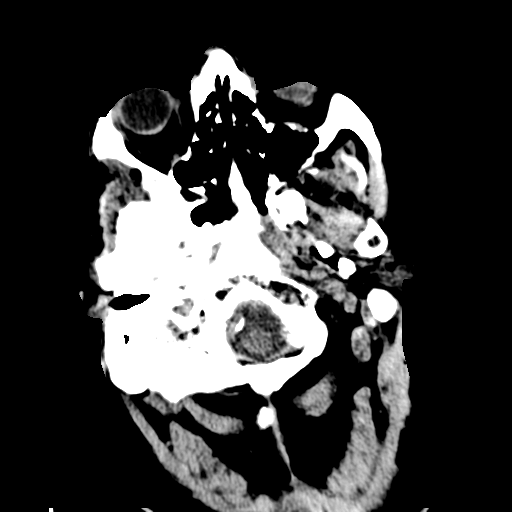
[im 7/36  brain]
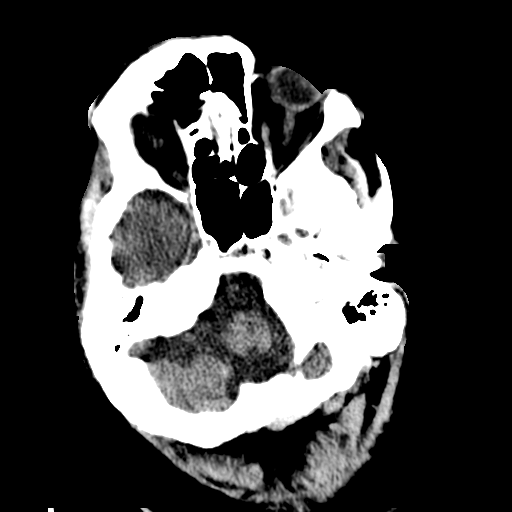
[im 9/36  brain]
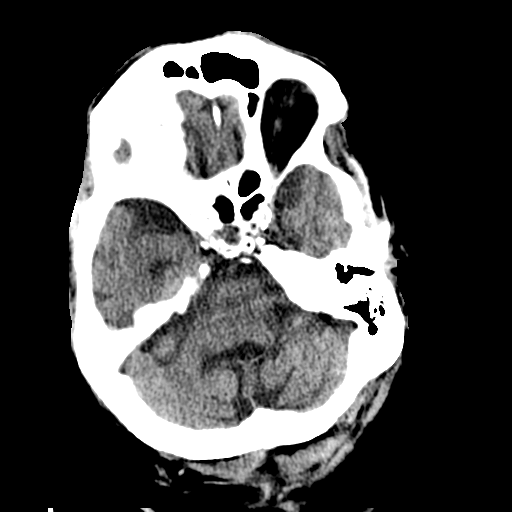
[im 10/36  brain]
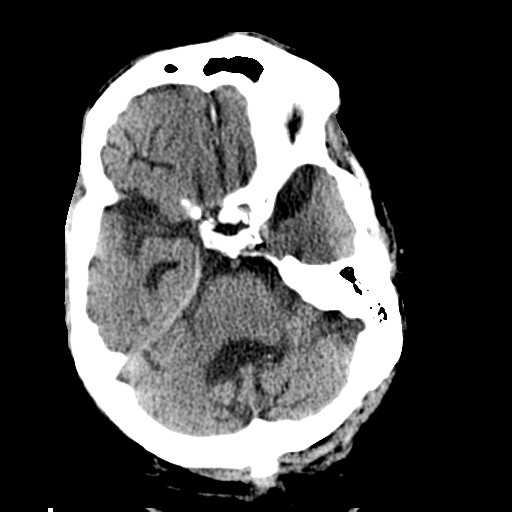
[im 10/36  bone]
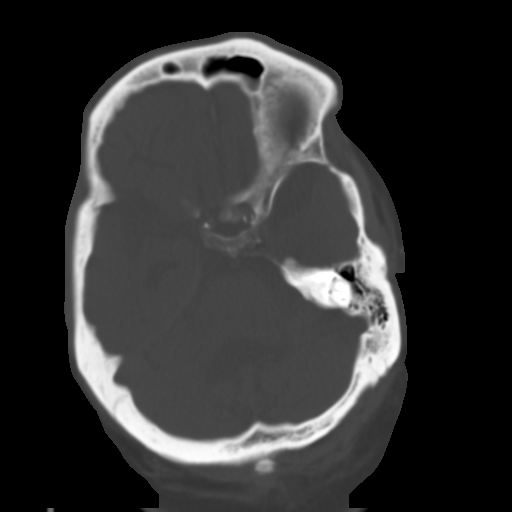
[im 13/36  brain]
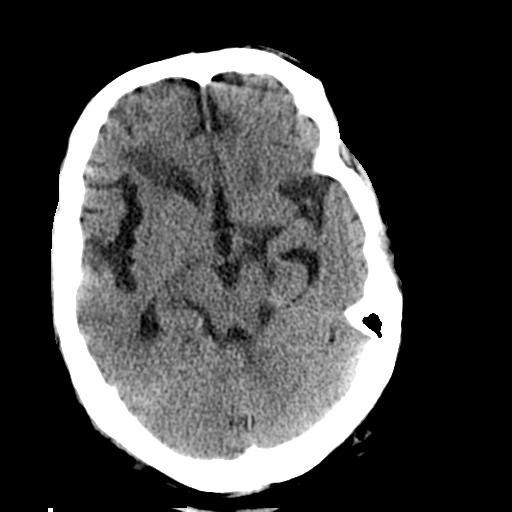
[im 15/36  brain]
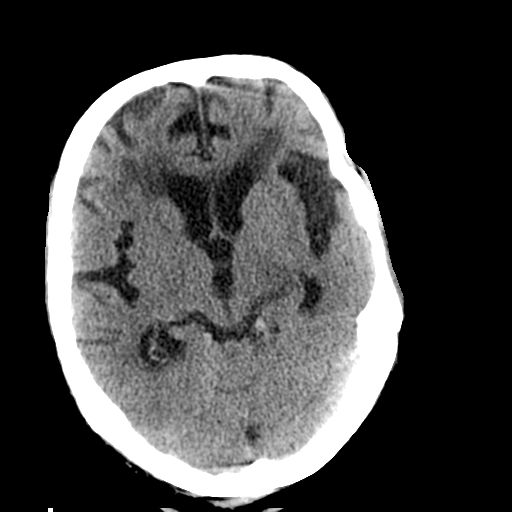
[im 17/36  brain]
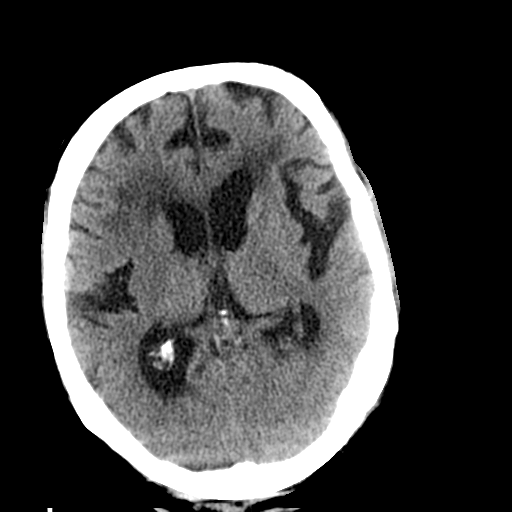
[im 19/36  brain]
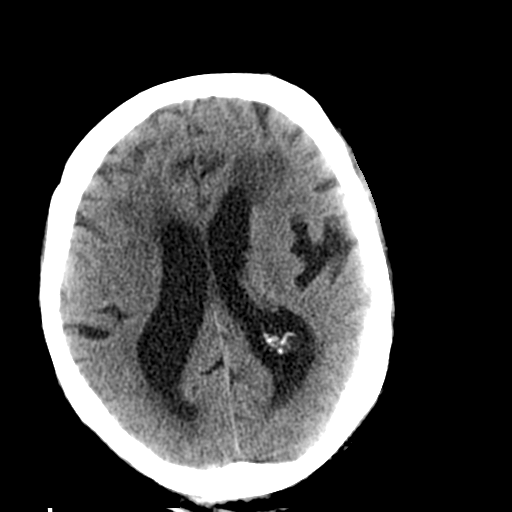
[im 19/36  bone]
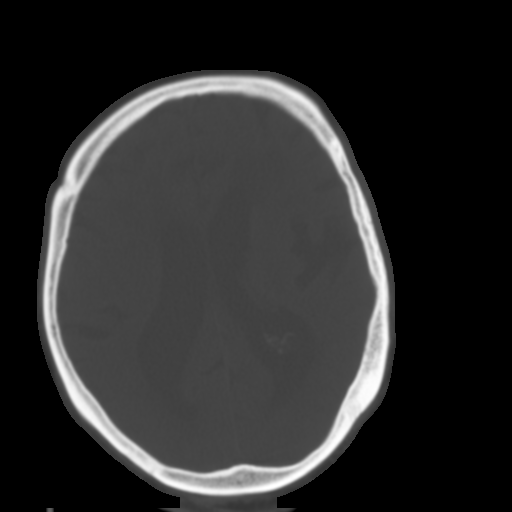
[im 21/36  brain]
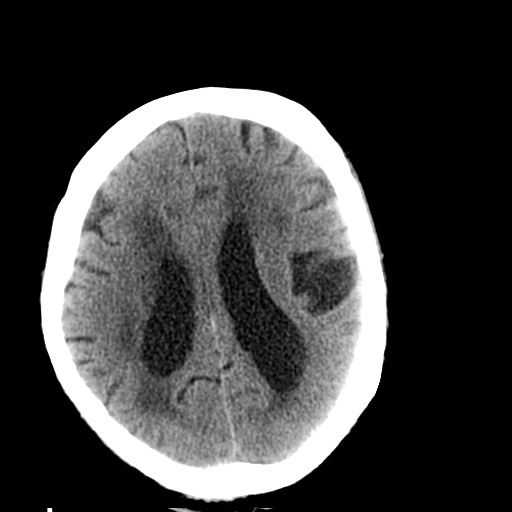
[im 23/36  brain]
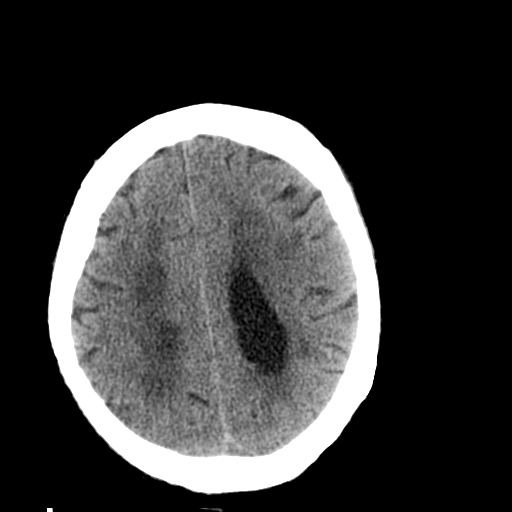
[im 26/36  brain]
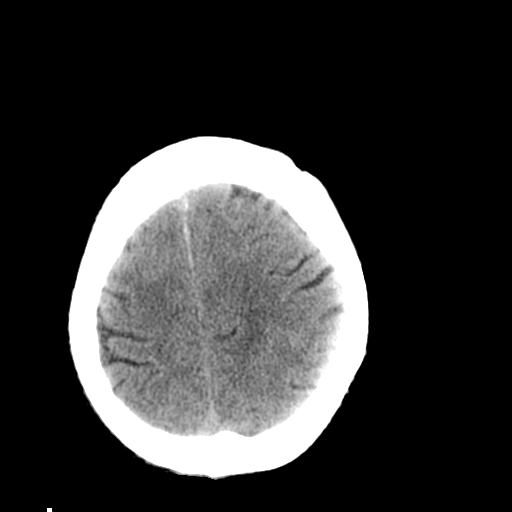
[im 27/36  brain]
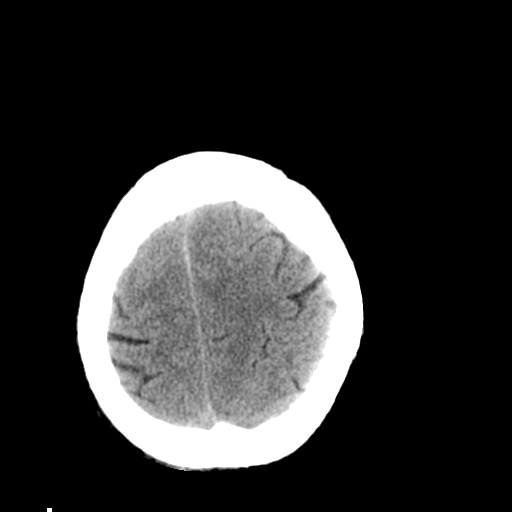
[im 27/36  bone]
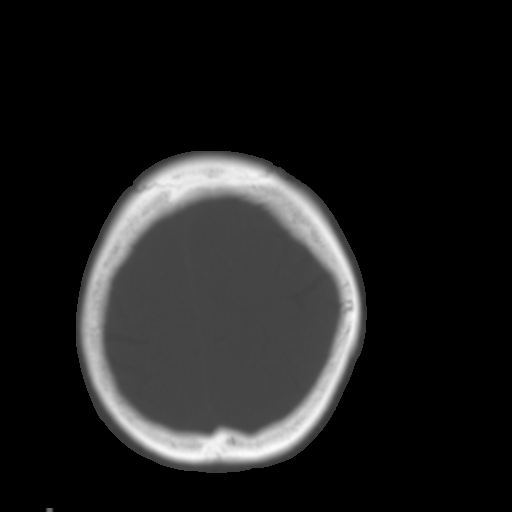
[im 29/36  brain]
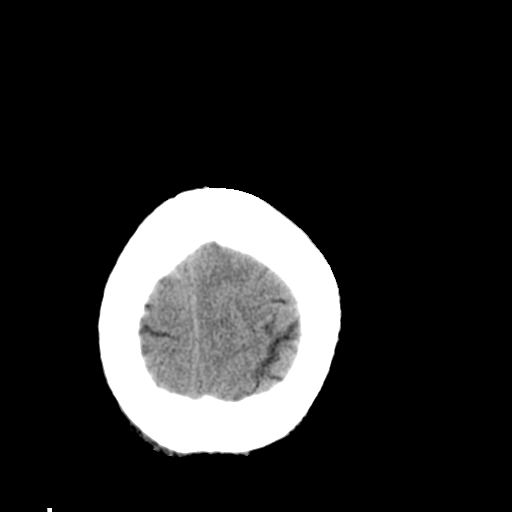
[im 32/36  brain]
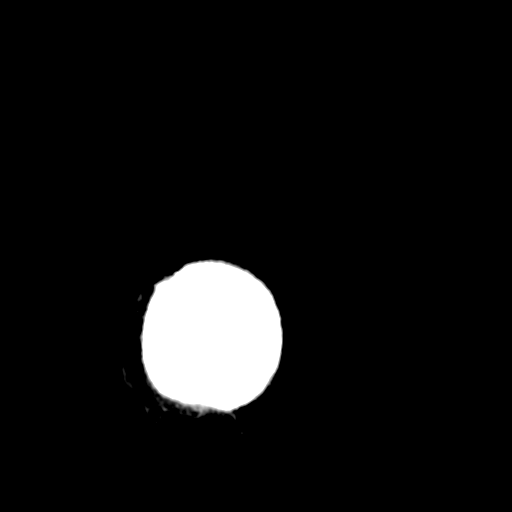
[im 34/36  brain]
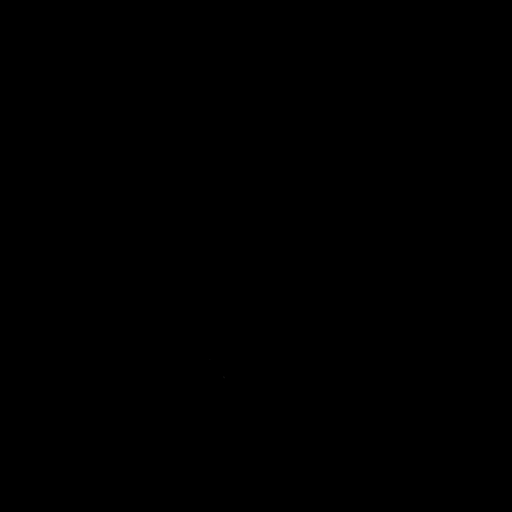

[16 of 30 positions shown; findings below may reference images not displayed]

FINDINGS: There is diffuse, confluent, periventricular white matter
hypodensity, compatible with chronic small vessel white matter
disease.

Moderate cerebral and cerebellar is noted.

The gray-white differentiation is preserved.

There is no evidence of an acute infarct.

There is no acute intracranial hemorrhage.

There is no midline shift or mass effect.

There is no extra-axial fluid collection.

The paranasal sinuses and mastoid air cells are aerated.

The orbits are intact.
IMPRESSION: 1. No acute intracranial hemorrhage.
2. Cerebral and cerebellar atrophy.
3. Extensive chronic small vessel white matter change.

## 2017-07-05 DEATH — deceased
